# Patient Record
Sex: Male | Born: 1945
Health system: Southern US, Community
[De-identification: ages and names within clinical notes are randomized; demographics above are authoritative.]

## PROBLEM LIST (undated history)

## (undated) DIAGNOSIS — K5790 Diverticulosis of intestine, part unspecified, without perforation or abscess without bleeding: Secondary | ICD-10-CM

## (undated) DIAGNOSIS — M1990 Unspecified osteoarthritis, unspecified site: Secondary | ICD-10-CM

## (undated) DIAGNOSIS — C61 Malignant neoplasm of prostate: Secondary | ICD-10-CM

## (undated) DIAGNOSIS — M479 Spondylosis, unspecified: Secondary | ICD-10-CM

## (undated) DIAGNOSIS — T7840XA Allergy, unspecified, initial encounter: Secondary | ICD-10-CM

## (undated) DIAGNOSIS — I700 Atherosclerosis of aorta: Secondary | ICD-10-CM

## (undated) DIAGNOSIS — I10 Essential (primary) hypertension: Secondary | ICD-10-CM

## (undated) DIAGNOSIS — K219 Gastro-esophageal reflux disease without esophagitis: Secondary | ICD-10-CM

## (undated) DIAGNOSIS — H269 Unspecified cataract: Secondary | ICD-10-CM

## (undated) HISTORY — DX: Unspecified cataract: H26.9

## (undated) HISTORY — DX: Atherosclerosis of aorta: I70.0

## (undated) HISTORY — DX: Spondylosis, unspecified: M47.9

## (undated) HISTORY — PX: KNEE SURGERY: SHX244

## (undated) HISTORY — DX: Allergy, unspecified, initial encounter: T78.40XA

## (undated) HISTORY — PX: TONSILLECTOMY: SHX5217

## (undated) HISTORY — DX: Essential (primary) hypertension: I10

## (undated) HISTORY — DX: Gastro-esophageal reflux disease without esophagitis: K21.9

## (undated) HISTORY — DX: Unspecified osteoarthritis, unspecified site: M19.90

## (undated) HISTORY — DX: Diverticulosis of intestine, part unspecified, without perforation or abscess without bleeding: K57.90

---

## 2001-01-02 ENCOUNTER — Encounter: Payer: Self-pay | Admitting: Emergency Medicine

## 2001-01-02 ENCOUNTER — Emergency Department (HOSPITAL_COMMUNITY): Admission: EM | Admit: 2001-01-02 | Discharge: 2001-01-02 | Payer: Self-pay | Admitting: Emergency Medicine

## 2006-02-02 ENCOUNTER — Emergency Department (HOSPITAL_COMMUNITY): Admission: EM | Admit: 2006-02-02 | Discharge: 2006-02-02 | Payer: Self-pay | Admitting: Emergency Medicine

## 2011-11-15 ENCOUNTER — Ambulatory Visit: Payer: Medicare Other | Attending: Physical Medicine and Rehabilitation

## 2011-11-15 DIAGNOSIS — M25659 Stiffness of unspecified hip, not elsewhere classified: Secondary | ICD-10-CM | POA: Insufficient documentation

## 2011-11-15 DIAGNOSIS — M545 Low back pain, unspecified: Secondary | ICD-10-CM | POA: Insufficient documentation

## 2011-11-15 DIAGNOSIS — IMO0001 Reserved for inherently not codable concepts without codable children: Secondary | ICD-10-CM | POA: Insufficient documentation

## 2011-11-15 DIAGNOSIS — R5381 Other malaise: Secondary | ICD-10-CM | POA: Insufficient documentation

## 2011-11-18 ENCOUNTER — Ambulatory Visit: Payer: Medicare Other | Admitting: Physical Therapy

## 2011-11-21 ENCOUNTER — Ambulatory Visit: Payer: Medicare Other

## 2011-11-23 ENCOUNTER — Ambulatory Visit: Payer: Medicare Other | Admitting: Physical Therapy

## 2011-11-29 ENCOUNTER — Ambulatory Visit: Payer: Medicare Other

## 2011-12-01 ENCOUNTER — Ambulatory Visit: Payer: Medicare Other

## 2011-12-05 ENCOUNTER — Ambulatory Visit: Payer: Medicare Other

## 2011-12-06 ENCOUNTER — Encounter: Payer: Medicare Other | Admitting: Physical Therapy

## 2011-12-12 ENCOUNTER — Encounter: Payer: Medicare Other | Admitting: Physical Therapy

## 2011-12-14 ENCOUNTER — Ambulatory Visit: Payer: Medicare Other | Attending: Physical Medicine and Rehabilitation

## 2011-12-14 DIAGNOSIS — M25659 Stiffness of unspecified hip, not elsewhere classified: Secondary | ICD-10-CM | POA: Insufficient documentation

## 2011-12-14 DIAGNOSIS — IMO0001 Reserved for inherently not codable concepts without codable children: Secondary | ICD-10-CM | POA: Insufficient documentation

## 2011-12-14 DIAGNOSIS — M545 Low back pain, unspecified: Secondary | ICD-10-CM | POA: Insufficient documentation

## 2011-12-14 DIAGNOSIS — R5381 Other malaise: Secondary | ICD-10-CM | POA: Insufficient documentation

## 2012-01-13 ENCOUNTER — Other Ambulatory Visit (HOSPITAL_COMMUNITY): Payer: Self-pay | Admitting: Orthopedic Surgery

## 2012-01-13 ENCOUNTER — Ambulatory Visit (HOSPITAL_COMMUNITY)
Admission: RE | Admit: 2012-01-13 | Discharge: 2012-01-13 | Disposition: A | Discharge status: Home / Self Care | Payer: Medicare Other | Source: Ambulatory Visit | Attending: Orthopedic Surgery | Admitting: Orthopedic Surgery

## 2012-01-13 DIAGNOSIS — Z1389 Encounter for screening for other disorder: Secondary | ICD-10-CM | POA: Insufficient documentation

## 2012-01-13 DIAGNOSIS — M25569 Pain in unspecified knee: Secondary | ICD-10-CM | POA: Insufficient documentation

## 2012-01-13 DIAGNOSIS — M25561 Pain in right knee: Secondary | ICD-10-CM

## 2012-02-22 ENCOUNTER — Ambulatory Visit: Payer: Medicare Other | Attending: Orthopedic Surgery

## 2012-02-22 DIAGNOSIS — M25569 Pain in unspecified knee: Secondary | ICD-10-CM | POA: Insufficient documentation

## 2012-02-22 DIAGNOSIS — IMO0001 Reserved for inherently not codable concepts without codable children: Secondary | ICD-10-CM | POA: Insufficient documentation

## 2012-02-22 DIAGNOSIS — M6281 Muscle weakness (generalized): Secondary | ICD-10-CM | POA: Insufficient documentation

## 2012-02-22 DIAGNOSIS — R269 Unspecified abnormalities of gait and mobility: Secondary | ICD-10-CM | POA: Insufficient documentation

## 2012-02-28 ENCOUNTER — Ambulatory Visit: Payer: Medicare Other | Admitting: Physical Therapy

## 2012-03-01 ENCOUNTER — Ambulatory Visit: Payer: Medicare Other

## 2012-03-06 ENCOUNTER — Ambulatory Visit: Payer: Medicare Other | Admitting: Physical Therapy

## 2012-03-08 ENCOUNTER — Ambulatory Visit: Payer: Medicare Other | Admitting: Physical Therapy

## 2012-03-13 ENCOUNTER — Ambulatory Visit: Payer: Medicare Other | Attending: Family Medicine

## 2012-03-13 DIAGNOSIS — R269 Unspecified abnormalities of gait and mobility: Secondary | ICD-10-CM | POA: Insufficient documentation

## 2012-03-13 DIAGNOSIS — M25569 Pain in unspecified knee: Secondary | ICD-10-CM | POA: Insufficient documentation

## 2012-03-13 DIAGNOSIS — M6281 Muscle weakness (generalized): Secondary | ICD-10-CM | POA: Insufficient documentation

## 2012-03-13 DIAGNOSIS — IMO0001 Reserved for inherently not codable concepts without codable children: Secondary | ICD-10-CM | POA: Insufficient documentation

## 2012-03-15 ENCOUNTER — Ambulatory Visit: Payer: Medicare Other | Admitting: Physical Therapy

## 2012-03-20 ENCOUNTER — Ambulatory Visit: Payer: Medicare Other | Admitting: Physical Therapy

## 2012-03-23 ENCOUNTER — Ambulatory Visit: Payer: Medicare Other | Admitting: Physical Therapy

## 2012-03-27 ENCOUNTER — Ambulatory Visit: Payer: Medicare Other

## 2012-03-30 ENCOUNTER — Ambulatory Visit: Payer: Medicare Other

## 2012-04-03 ENCOUNTER — Encounter: Payer: Medicare Other | Admitting: Physical Therapy

## 2012-12-01 ENCOUNTER — Emergency Department (HOSPITAL_COMMUNITY): Payer: Medicare Other

## 2012-12-01 ENCOUNTER — Encounter (HOSPITAL_COMMUNITY): Payer: Self-pay | Admitting: Emergency Medicine

## 2012-12-01 ENCOUNTER — Emergency Department (HOSPITAL_COMMUNITY)
Admission: EM | Admit: 2012-12-01 | Discharge: 2012-12-02 | Disposition: A | Discharge status: Home / Self Care | Payer: Medicare Other | Attending: Emergency Medicine | Admitting: Emergency Medicine

## 2012-12-01 DIAGNOSIS — S8990XA Unspecified injury of unspecified lower leg, initial encounter: Secondary | ICD-10-CM | POA: Insufficient documentation

## 2012-12-01 DIAGNOSIS — Z79899 Other long term (current) drug therapy: Secondary | ICD-10-CM | POA: Insufficient documentation

## 2012-12-01 DIAGNOSIS — Y929 Unspecified place or not applicable: Secondary | ICD-10-CM | POA: Insufficient documentation

## 2012-12-01 DIAGNOSIS — X500XXA Overexertion from strenuous movement or load, initial encounter: Secondary | ICD-10-CM | POA: Insufficient documentation

## 2012-12-01 DIAGNOSIS — M25562 Pain in left knee: Secondary | ICD-10-CM

## 2012-12-01 DIAGNOSIS — Y939 Activity, unspecified: Secondary | ICD-10-CM | POA: Insufficient documentation

## 2012-12-01 HISTORY — DX: Malignant neoplasm of prostate: C61

## 2012-12-01 MED ORDER — OXYCODONE-ACETAMINOPHEN 5-325 MG PO TABS
2.0000 | ORAL_TABLET | Freq: Once | ORAL | Status: AC
  Administered 2012-12-01: 2 via ORAL
  Filled 2012-12-01: qty 2

## 2012-12-01 NOTE — ED Provider Notes (Signed)
CSN: [id number]     Arrival date & time 12/01/12  2233 History  This chart was scribed for non-physician practitioner Antony Madura, PA-C, working with Doug Sou, MD by Dorothey Baseman, ED Scribe. This patient was seen in room TR08C/TR08C and the patient's care was started at 11:34 PM.    Chief Complaint  Patient presents with  . Knee Pain   Patient is a 67 y.o. male presenting with knee pain. The history is provided by the patient. No language interpreter was used.  Knee Pain Location:  Knee Knee location:  L knee Pain details:    Radiates to:  Does not radiate   Severity:  Moderate   Onset quality:  Sudden   Timing:  Constant   Progression:  Worsening Relieved by:  Nothing Worsened by:  Bearing weight Ineffective treatments:  Muscle relaxant and ice Associated symptoms: swelling   Associated symptoms: no numbness and no tingling    HPI Comments: Marcus Gomez is a 67 y.o. male who presents to the Emergency Department complaining of a constant, sore pain to the left knee onset a few days ago. Patient reports that yesterday the knee "popped" loudly and the pain began progressively worsening. He reports that the pain is exacerbated with movement and walking. Patient reports some associated swelling to the area. He reports applying ice to the area and taking Robaxin at home without relief. He denies numbness and paresthesias. Patient reports a history of surgery to the right knee in February, 2014 (performed by Dr. Charlann Boxer).    Past Medical History  Diagnosis Date  . Prostate cancer     Remission   No past surgical history on file. Family History  Problem Relation Age of Onset  . Stroke Mother    History  Substance Use Topics  . Smoking status: Never Smoker   . Smokeless tobacco: Never Used  . Alcohol Use: No    Review of Systems  Musculoskeletal: Positive for arthralgias and joint swelling.  Neurological: Negative for numbness.    Allergies  Review of patient's  allergies indicates no known allergies.  Home Medications   Current Outpatient Rx  Name  Route  Sig  Dispense  Refill  . methocarbamol (ROBAXIN) 500 MG tablet   Oral   Take 500 mg by mouth 4 (four) times daily.         Marland Kitchen ibuprofen (ADVIL,MOTRIN) 600 MG tablet   Oral   Take 1 tablet (600 mg total) by mouth every 6 (six) hours as needed.   30 tablet   0   . oxyCODONE-acetaminophen (PERCOCET/ROXICET) 5-325 MG per tablet   Oral   Take 1-2 tablets by mouth every 6 (six) hours as needed for severe pain.   17 tablet   0    Triage Vitals: BP 152/101  Pulse 111  Temp(Src) 98.7 F (37.1 C) (Oral)  Resp 18  SpO2 97%  Physical Exam  Nursing note and vitals reviewed. Constitutional: He is oriented to person, place, and time. He appears well-developed and well-nourished. No distress.  HENT:  Head: Normocephalic and atraumatic.  Eyes: Conjunctivae and EOM are normal. No scleral icterus.  Neck: Normal range of motion.  Cardiovascular: Normal rate, regular rhythm and intact distal pulses.   Pulses:      Dorsalis pedis pulses are 2+ on the right side, and 2+ on the left side.       Posterior tibial pulses are 2+ on the right side, and 2+ on the left side.  Pulmonary/Chest:  Effort normal. No respiratory distress.  Musculoskeletal: Normal range of motion.  Tenderness to palpation to the medial and lateral joint lines of the left knee. Limitation of ROM with extension of the left knee secondary to pain. Pain with valgus and varous stressing of L knee. No laxity appreciated.  Neurological: He is alert and oriented to person, place, and time. He has normal reflexes. He displays normal reflexes. No sensory deficit.  Distal sensation intact. No gross sensory deficits in the lower extremities bilaterally. 5/5 strength with flexion and extension of the left knee.   Skin: Skin is warm and dry. No rash noted. He is not diaphoretic. No erythema. No pallor.  Psychiatric: He has a normal mood and  affect. His behavior is normal.    ED Course  Procedures (including critical care time)  DIAGNOSTIC STUDIES: Oxygen Saturation is 97% on room air, normal by my interpretation.    COORDINATION OF CARE: 11:39 PM- Ordered an x-ray of the left knee. Discussed treatment plan with patient at bedside and patient verbalized agreement.   Labs Review Labs Reviewed - No data to display  Imaging Review Dg Knee Complete 4 Views Left  12/01/2012   CLINICAL DATA:  Pain in the anterior and posterior knee.  EXAM: LEFT KNEE - COMPLETE 4+ VIEW  COMPARISON:  None.  FINDINGS: There is no evidence of fracture, dislocation. There is mild tibial spine osteophytosis. Minimal suprapatellar effusion is identified. There is mild narrowing of the medial femoral tibial space.  IMPRESSION: No acute fracture or dislocation. Mild degenerative joint changes of left knee.   Electronically Signed   By: Sherian Rein M.D.   On: 12/01/2012 23:34    EKG Interpretation   None       MDM   1. Left knee pain    Patient presents for left knee pain with onset 2 days ago after hearing a "pop". Patient neurovascularly intact with normal strength and sensation in his left lower extremity. Patient with difficulty bearing weight secondary to pain, though no laxity appreciated on physical exam. No evidence of septic joint such as significant swelling, erythema, or heat to touch. X-ray negative for acute fracture or dislocation of the left knee though mild degenerative changes are appreciated. Patient treated in ED with Percocet for pain control. He was given a knee sleeve for added stability and states he has a roller walker at home to refrain from weight bearing on his left leg. Have recommended the patient followup with his orthopedist, Dr. Charlann Boxer, regarding his symptoms. Patient prescribed ibuprofen for pain control and Percocet for breakthrough pain. Return precautions discussed with the patient who verbalizes comfort and  understanding with this discharge plan. Patient hemodynamically stable and appropriate for discharge.  I personally performed the services described in this documentation, which was scribed in my presence. The recorded information has been reviewed and is accurate.   Filed Vitals:   12/01/12 2257 12/02/12 0036  BP: 152/101 152/70  Pulse: 111 99  Temp: 98.7 F (37.1 C) 98 F (36.7 C)  TempSrc: Oral Oral  Resp: 18 18  SpO2: 97% 97%     Antony Madura, PA-C 12/02/12 [phone]

## 2012-12-01 NOTE — ED Notes (Signed)
Pt c/o left sided knee pain x 2 days. Yesterday the knee popped and pain became to severe.

## 2012-12-01 NOTE — ED Notes (Addendum)
Pt stated that yesterday his knee popped while walking, and he has been having severe pain since. He stated that he has pain all over the knee and up and down his leg. Pt has a hard time walking.  Pain is in left knee. Left knee tender to touch. Slight swelling.  No cardiac or respiratory distress. Will continue to monitor.

## 2012-12-02 MED ORDER — IBUPROFEN 600 MG PO TABS: 600.0000 mg | ORAL_TABLET | Freq: Four times a day (QID) | ORAL | Status: DC | PRN

## 2012-12-02 MED ORDER — OXYCODONE-ACETAMINOPHEN 5-325 MG PO TABS: 1.0000 | ORAL_TABLET | Freq: Four times a day (QID) | ORAL | Status: DC | PRN

## 2012-12-02 NOTE — ED Notes (Signed)
Pt made aware that he cannot drive and operate on heavy machinery while taking pain medications. Pt notified not to take additional Tylenol.

## 2012-12-02 NOTE — ED Provider Notes (Signed)
Medical screening examination/treatment/procedure(s) were performed by non-physician practitioner and as supervising physician I was immediately available for consultation/collaboration.  EKG Interpretation   None        Doug Sou, MD 12/02/12 [phone]

## 2012-12-02 NOTE — ED Notes (Signed)
Pt stated that chair was uncomfortable Moved pt into a room with a bed. Pt stated bed is more comfortable.

## 2012-12-09 ENCOUNTER — Emergency Department (HOSPITAL_COMMUNITY): Payer: Medicare Other

## 2012-12-09 ENCOUNTER — Encounter (HOSPITAL_COMMUNITY): Payer: Self-pay | Admitting: Emergency Medicine

## 2012-12-09 ENCOUNTER — Emergency Department (HOSPITAL_COMMUNITY)
Admission: EM | Admit: 2012-12-09 | Discharge: 2012-12-09 | Disposition: A | Discharge status: Home / Self Care | Payer: Medicare Other | Attending: Emergency Medicine | Admitting: Emergency Medicine

## 2012-12-09 DIAGNOSIS — Z8546 Personal history of malignant neoplasm of prostate: Secondary | ICD-10-CM | POA: Insufficient documentation

## 2012-12-09 DIAGNOSIS — R109 Unspecified abdominal pain: Secondary | ICD-10-CM | POA: Insufficient documentation

## 2012-12-09 DIAGNOSIS — N509 Disorder of male genital organs, unspecified: Secondary | ICD-10-CM | POA: Insufficient documentation

## 2012-12-09 DIAGNOSIS — R197 Diarrhea, unspecified: Secondary | ICD-10-CM | POA: Insufficient documentation

## 2012-12-09 LAB — CBC WITH DIFFERENTIAL/PLATELET
HCT: 44.4 % (ref 39.0–52.0)
Hemoglobin: 15.5 g/dL (ref 13.0–17.0)
Lymphocytes Relative: 19 % (ref 12–46)
Lymphs Abs: 1.3 10*3/uL (ref 0.7–4.0)
MCHC: 34.9 g/dL (ref 30.0–36.0)
Monocytes Absolute: 0.5 10*3/uL (ref 0.1–1.0)
Monocytes Relative: 7 % (ref 3–12)
Neutro Abs: 5 10*3/uL (ref 1.7–7.7)
Neutrophils Relative %: 74 % (ref 43–77)

## 2012-12-09 LAB — URINALYSIS, ROUTINE W REFLEX MICROSCOPIC
Leukocytes, UA: NEGATIVE
Protein, ur: NEGATIVE mg/dL
Urobilinogen, UA: 0.2 mg/dL (ref 0.0–1.0)
pH: 5.5 (ref 5.0–8.0)

## 2012-12-09 LAB — COMPREHENSIVE METABOLIC PANEL
ALT: 21 U/L (ref 0–53)
BUN: 18 mg/dL (ref 6–23)
CO2: 22 mEq/L (ref 19–32)
Chloride: 97 mEq/L (ref 96–112)
Creatinine, Ser: 1.03 mg/dL (ref 0.50–1.35)
GFR calc non Af Amer: 73 mL/min — ABNORMAL LOW (ref >90)
Total Bilirubin: 1.3 mg/dL — ABNORMAL HIGH (ref 0.3–1.2)

## 2012-12-09 MED ORDER — OXYCODONE-ACETAMINOPHEN 5-325 MG PO TABS: 1.0000 | ORAL_TABLET | Freq: Four times a day (QID) | ORAL | Status: DC | PRN

## 2012-12-09 MED ORDER — MORPHINE SULFATE 4 MG/ML IJ SOLN
4.0000 mg | Freq: Once | INTRAMUSCULAR | Status: AC
  Administered 2012-12-09: 4 mg via INTRAVENOUS
  Filled 2012-12-09: qty 1

## 2012-12-09 MED ORDER — SODIUM CHLORIDE 0.9 % IV BOLUS (SEPSIS)
500.0000 mL | Freq: Once | INTRAVENOUS | Status: AC
  Administered 2012-12-09: 500 mL via INTRAVENOUS

## 2012-12-09 MED ORDER — ONDANSETRON HCL 4 MG/2ML IJ SOLN
4.0000 mg | Freq: Once | INTRAMUSCULAR | Status: AC
  Administered 2012-12-09: 4 mg via INTRAVENOUS
  Filled 2012-12-09: qty 2

## 2012-12-09 NOTE — ED Notes (Signed)
Pt reports R flank/back pain radiating into R testicle increasingly worse over past 3 days. Reports last night he had diarrhea also

## 2012-12-09 NOTE — ED Notes (Signed)
Patient transported to CT 

## 2012-12-09 NOTE — ED Provider Notes (Signed)
CSN: [id number]     Arrival date & time 12/09/12  1226 History   First MD Initiated Contact with Patient 12/09/12 1253     Chief Complaint  Patient presents with  . Flank Pain  . Groin Pain   Patient is a 67 y.o. male presenting with flank pain and groin pain. The history is provided by the patient.  Flank Pain This is a new problem. The current episode started 2 days ago. The problem occurs constantly. The problem has been gradually worsening. Associated symptoms include abdominal pain. Pertinent negatives include no chest pain, no headaches and no shortness of breath. Nothing aggravates the symptoms. Nothing relieves the symptoms. He has tried rest for the symptoms. The treatment provided no relief.  Groin Pain Associated symptoms include abdominal pain. Pertinent negatives include no chest pain, no headaches and no shortness of breath.  pt reports similar pain "over 10 yrs ago" and it was thought to be kidney stone but none was ever found He reports pain starts in his right flank then radiates into right testicle No trauma reported   Past Medical History  Diagnosis Date  . Prostate cancer     Remission   History reviewed. No pertinent past surgical history. Family History  Problem Relation Age of Onset  . Stroke Mother    History  Substance Use Topics  . Smoking status: Never Smoker   . Smokeless tobacco: Never Used  . Alcohol Use: No    Review of Systems  Constitutional: Negative for fever.  Respiratory: Negative for shortness of breath.   Cardiovascular: Negative for chest pain.  Gastrointestinal: Positive for abdominal pain and diarrhea. Negative for vomiting.  Genitourinary: Positive for flank pain and testicular pain. Negative for dysuria.  Neurological: Negative for weakness and headaches.  All other systems reviewed and are negative.    Allergies  Review of patient's allergies indicates no known allergies.  Home Medications   Current Outpatient Rx  Name   Route  Sig  Dispense  Refill  . ibuprofen (ADVIL,MOTRIN) 600 MG tablet   Oral   Take 1 tablet (600 mg total) by mouth every 6 (six) hours as needed.   30 tablet   0   . methocarbamol (ROBAXIN) 500 MG tablet   Oral   Take 500 mg by mouth 4 (four) times daily.         Marland Kitchen oxyCODONE-acetaminophen (PERCOCET/ROXICET) 5-325 MG per tablet   Oral   Take 1-2 tablets by mouth every 6 (six) hours as needed for severe pain.   17 tablet   0    BP 119/58  Pulse 82  Temp(Src) 98 F (36.7 C) (Oral)  Resp 16  Wt 225 lb 14.4 oz (102.468 kg)  SpO2 98% Physical Exam CONSTITUTIONAL: Well developed/well nourished HEAD: Normocephalic/atraumatic EYES: EOMI/PERRL ENMT: Mucous membranes moist NECK: supple no meningeal signs SPINE:entire spine nontender CV: S1/S2 noted, no murmurs/rubs/gallops noted LUNGS: Lungs are clear to auscultation bilaterally, no apparent distress ABDOMEN: soft, nontender, no rebound or guarding WU:JWJXB cva tenderness. No inguinal hernia, no scrotal edema/erythema.  No penile lesions.  Mild tenderness to right testicle NEURO: Pt is awake/alert, moves all extremitiesx4 EXTREMITIES: pulses normal, full ROM SKIN: warm, color normal PSYCH: no abnormalities of mood noted  ED Course  Procedures  Labs Review Labs Reviewed  CBC WITH DIFFERENTIAL  COMPREHENSIVE METABOLIC PANEL  URINALYSIS, ROUTINE W REFLEX MICROSCOPIC   Imaging Review No results found.  EKG Interpretation   None     1:41 PM Labs  pending No signs of testicular torsion at this time 3:37 PM Pain continues Will proceed with CT imaging (ureterolithiasis is still possible despite negative u/a) D/w dr Wilkie Aye to f/u on CT imaging and dispo accordingly   MDM  No diagnosis found. Nursing notes including past medical history and social history reviewed and considered in documentation Labs/vital reviewed and considered     Joya Gaskins, MD 12/09/12 1538

## 2013-02-18 ENCOUNTER — Emergency Department (HOSPITAL_COMMUNITY)
Admission: EM | Admit: 2013-02-18 | Discharge: 2013-02-18 | Disposition: A | Discharge status: Home / Self Care | Payer: Medicare Other | Source: Home / Self Care | Attending: Family Medicine | Admitting: Family Medicine

## 2013-02-18 ENCOUNTER — Encounter (HOSPITAL_COMMUNITY): Payer: Self-pay | Admitting: Emergency Medicine

## 2013-02-18 DIAGNOSIS — J329 Chronic sinusitis, unspecified: Secondary | ICD-10-CM

## 2013-02-18 MED ORDER — AMOXICILLIN-POT CLAVULANATE 875-125 MG PO TABS: 1.0000 | ORAL_TABLET | Freq: Two times a day (BID) | ORAL | Status: DC

## 2013-02-18 NOTE — ED Notes (Signed)
Pt  Reports  Symptoms  Of    Sinus  Drainage        And  Congestion   And  Cough  For     sev  Days  With the  Symptoms  Nor  releived  By otc  meds           The  Pt  denys  Any  Nausea  /  Vomiting  Or  Any  Diarrhea    He  Is  Sitting  Upright on exam table  Speaking in  Complete  sentances

## 2013-02-18 NOTE — Discharge Instructions (Signed)

## 2013-02-18 NOTE — ED Provider Notes (Signed)
CSN: [id number]     Arrival date & time 02/18/13  0920 History   First MD Initiated Contact with Patient 02/18/13 0930     No chief complaint on file.  (Consider location/radiation/quality/duration/timing/severity/associated sxs/prior Treatment) Patient is a 68 y.o. male presenting with cough. The history is provided by the patient. No language interpreter was used.  Cough Cough characteristics:  Productive Sputum characteristics:  Green Severity:  Moderate Onset quality:  Gradual Duration:  1 week Timing:  Constant Progression:  Worsening Chronicity:  New Smoker: yes   Relieved by:  Nothing Worsened by:  Nothing tried Ineffective treatments:  None tried Associated symptoms: fever   Risk factors: recent infection     Past Medical History  Diagnosis Date  . Prostate cancer     Remission   No past surgical history on file. Family History  Problem Relation Age of Onset  . Stroke Mother    History  Substance Use Topics  . Smoking status: Never Smoker   . Smokeless tobacco: Never Used  . Alcohol Use: No    Review of Systems  Constitutional: Positive for fever.  Respiratory: Positive for cough.   All other systems reviewed and are negative.    Allergies  Review of patient's allergies indicates no known allergies.  Home Medications   Current Outpatient Rx  Name  Route  Sig  Dispense  Refill  . ibuprofen (ADVIL,MOTRIN) 600 MG tablet   Oral   Take 1 tablet (600 mg total) by mouth every 6 (six) hours as needed.   30 tablet   0   . methocarbamol (ROBAXIN) 500 MG tablet   Oral   Take 500 mg by mouth 4 (four) times daily.         Marland Kitchen oxyCODONE-acetaminophen (PERCOCET/ROXICET) 5-325 MG per tablet   Oral   Take 1-2 tablets by mouth every 6 (six) hours as needed for severe pain.   17 tablet   0   . oxyCODONE-acetaminophen (PERCOCET/ROXICET) 5-325 MG per tablet   Oral   Take 1 tablet by mouth every 6 (six) hours as needed for severe pain.   10 tablet   0     BP 138/81  Pulse 96  Temp(Src) 99.4 F (37.4 C) (Oral)  Resp 18  SpO2 96% Physical Exam  Nursing note and vitals reviewed. Constitutional: He is oriented to person, place, and time. He appears well-developed and well-nourished.  HENT:  Head: Normocephalic and atraumatic.  Right Ear: External ear normal.  Left Ear: External ear normal.  Eyes: EOM are normal. Pupils are equal, round, and reactive to light.  Neck: Normal range of motion.  Cardiovascular: Normal rate and regular rhythm.   Pulmonary/Chest: Effort normal and breath sounds normal.  Abdominal: Soft. He exhibits no distension.  Musculoskeletal: Normal range of motion.  Neurological: He is alert and oriented to person, place, and time.  Skin: Skin is warm.  Psychiatric: He has a normal mood and affect.    ED Course  Procedures (including critical care time) Labs Review Labs Reviewed - No data to display Imaging Review No results found.    MDM   1. Sinus infection    augmentin    [place], [place] 02/18/13 [phone]

## 2013-02-20 NOTE — ED Provider Notes (Signed)
Medical screening examination/treatment/procedure(s) were performed by a resident physician or non-physician practitioner and as the supervising physician I was immediately available for consultation/collaboration.  Lynne Leader, MD    Gregor Hams, MD 02/20/13 [phone]

## 2013-03-19 ENCOUNTER — Encounter (HOSPITAL_COMMUNITY): Payer: Self-pay | Admitting: Emergency Medicine

## 2013-03-19 ENCOUNTER — Emergency Department (HOSPITAL_COMMUNITY)
Admission: EM | Admit: 2013-03-19 | Discharge: 2013-03-19 | Disposition: A | Discharge status: Home / Self Care | Payer: Medicare Other | Source: Home / Self Care | Attending: Emergency Medicine | Admitting: Emergency Medicine

## 2013-03-19 DIAGNOSIS — J309 Allergic rhinitis, unspecified: Secondary | ICD-10-CM

## 2013-03-19 MED ORDER — FLUTICASONE PROPIONATE 50 MCG/ACT NA SUSP: 2.0000 | Freq: Every day | NASAL | Status: DC

## 2013-03-19 MED ORDER — CETIRIZINE HCL 10 MG PO TABS: 10.0000 mg | ORAL_TABLET | Freq: Every day | ORAL | Status: DC

## 2013-03-19 MED ORDER — BENZONATATE 100 MG PO CAPS: 100.0000 mg | ORAL_CAPSULE | Freq: Three times a day (TID) | ORAL | Status: DC | PRN

## 2013-03-19 NOTE — ED Notes (Signed)
C/o cold sx States he was here on 02/18/13 feeling the same way States cough is worst Antibiotic was given but did not full help States he does have some sneezing and body ache  Delsym was used for the cough

## 2013-03-19 NOTE — Discharge Instructions (Signed)
Allergic Rhinitis Allergic rhinitis is when the mucous membranes in the nose respond to allergens. Allergens are particles in the air that cause your body to have an allergic reaction. This causes you to release allergic antibodies. Through a chain of events, these eventually cause you to release histamine into the blood stream. Although meant to protect the body, it is this release of histamine that causes your discomfort, such as frequent sneezing, congestion, and an itchy, runny nose.  CAUSES  Seasonal allergic rhinitis (hay fever) is caused by pollen allergens that may come from grasses, trees, and weeds. Year-round allergic rhinitis (perennial allergic rhinitis) is caused by allergens such as house dust mites, pet dander, and mold spores.  SYMPTOMS   Nasal stuffiness (congestion).  Itchy, runny nose with sneezing and tearing of the eyes. DIAGNOSIS  Your health care provider can help you determine the allergen or allergens that trigger your symptoms. If you and your health care provider are unable to determine the allergen, skin or blood testing may be used. TREATMENT  Allergic Rhinitis does not have a cure, but it can be controlled by:  Medicines and allergy shots (immunotherapy).  Avoiding the allergen. Hay fever may often be treated with antihistamines in pill or nasal spray forms. Antihistamines block the effects of histamine. There are over-the-counter medicines that may help with nasal congestion and swelling around the eyes. Check with your health care provider before taking or giving this medicine.  If avoiding the allergen or the medicine prescribed do not work, there are many new medicines your health care provider can prescribe. Stronger medicine may be used if initial measures are ineffective. Desensitizing injections can be used if medicine and avoidance does not work. Desensitization is when a patient is given ongoing shots until the body becomes less sensitive to the allergen.  Make sure you follow up with your health care provider if problems continue. HOME CARE INSTRUCTIONS It is not possible to completely avoid allergens, but you can reduce your symptoms by taking steps to limit your exposure to them. It helps to know exactly what you are allergic to so that you can avoid your specific triggers. SEEK MEDICAL CARE IF:   You have a fever.  You develop a cough that does not stop easily (persistent).  You have shortness of breath.  You start wheezing.  Symptoms interfere with normal daily activities. Document Released: 09/21/2000 Document Revised: 10/17/2012 Document Reviewed: 09/03/2012 [employer] Patient Information 2014 [employer].  

## 2013-03-19 NOTE — ED Provider Notes (Signed)
CSN: [id number]     Arrival date & time 03/19/13  1058 History   First MD Initiated Contact with Patient 03/19/13 1222     Chief Complaint  Patient presents with  . URI   (Consider location/radiation/quality/duration/timing/severity/associated sxs/prior Treatment) HPI Comments: Patient work in [employer] school [employer] and reports a 3 day history of rhinorrhea, sneezing, throat irritation, post nasal drainage, cough and generalized malaise without reports of fever or GI issues. Denies chest pain or dyspnea. States this a recurrent issue. Used to see [facility], but no longer under their care and does not take medication for environmental allergy any longer. Patient is a non-smoker. PCP: Dr. Rex Kras   Patient is a 68 y.o. male presenting with URI. The history is provided by the patient.  URI   Past Medical History  Diagnosis Date  . Prostate cancer     Remission   History reviewed. No pertinent past surgical history. Family History  Problem Relation Age of Onset  . Stroke Mother    History  Substance Use Topics  . Smoking status: Never Smoker   . Smokeless tobacco: Never Used  . Alcohol Use: No    Review of Systems  All other systems reviewed and are negative.    Allergies  Review of patient's allergies indicates no known allergies.  Home Medications   Current Outpatient Rx  Name  Route  Sig  Dispense  Refill  . amoxicillin-clavulanate (AUGMENTIN) 875-125 MG per tablet   Oral   Take 1 tablet by mouth every 12 (twelve) hours.   21 tablet   0   . benzonatate (TESSALON) 100 MG capsule   Oral   Take 1 capsule (100 mg total) by mouth 3 (three) times daily as needed for cough.   21 capsule   0   . cetirizine (ZYRTEC) 10 MG tablet   Oral   Take 1 tablet (10 mg total) by mouth daily.   30 tablet   0   . fluticasone (FLONASE) 50 MCG/ACT nasal spray   Each Nare   Place 2 sprays into both nostrils daily.   16 g   1   . ibuprofen (ADVIL,MOTRIN) 600 MG tablet    Oral   Take 1 tablet (600 mg total) by mouth every 6 (six) hours as needed.   30 tablet   0   . methocarbamol (ROBAXIN) 500 MG tablet   Oral   Take 500 mg by mouth 4 (four) times daily.         Marland Kitchen oxyCODONE-acetaminophen (PERCOCET/ROXICET) 5-325 MG per tablet   Oral   Take 1-2 tablets by mouth every 6 (six) hours as needed for severe pain.   17 tablet   0   . oxyCODONE-acetaminophen (PERCOCET/ROXICET) 5-325 MG per tablet   Oral   Take 1 tablet by mouth every 6 (six) hours as needed for severe pain.   10 tablet   0    BP 125/71  Pulse 79  Temp(Src) 99.9 F (37.7 C) (Oral)  Resp 18  SpO2 95% Physical Exam  Nursing note and vitals reviewed. Constitutional: He is oriented to person, place, and time. He appears well-developed and well-nourished. No distress.  HENT:  Head: Normocephalic and atraumatic.  Right Ear: Hearing, tympanic membrane, external ear and ear canal normal.  Left Ear: Hearing, tympanic membrane, external ear and ear canal normal.  Nose: Nose normal.  Mouth/Throat: Uvula is midline and mucous membranes are normal. No oropharyngeal exudate, posterior oropharyngeal edema or tonsillar abscesses.  Mild posterior  erythema with post nasal drainage.   Eyes: Conjunctivae are normal. Right eye exhibits no discharge. Left eye exhibits no discharge. No scleral icterus.  Neck: Normal range of motion. Neck supple.  Cardiovascular: Normal rate, regular rhythm and normal heart sounds.   Pulmonary/Chest: Effort normal and breath sounds normal. No respiratory distress. He has no wheezes.  Abdominal: Soft. Bowel sounds are normal.  Musculoskeletal: Normal range of motion.  Lymphadenopathy:    He has no cervical adenopathy.  Neurological: He is alert and oriented to person, place, and time.  Skin: Skin is warm and dry. No rash noted.  Psychiatric: He has a normal mood and affect. His behavior is normal.    ED Course  Procedures (including critical care time) Labs  Review Labs Reviewed - No data to display Imaging Review No results found.   MDM   1. Allergic rhinitis    Allergic rhinitis: Will treat with Zyrtec, Flonase and tessalon for cough and advise PCP follow up if no improvement.     [place], [place] 03/19/13 [phone]

## 2013-03-19 NOTE — ED Provider Notes (Signed)
Medical screening examination/treatment/procedure(s) were performed by non-physician practitioner and as supervising physician I was immediately available for consultation/collaboration.  Philipp Deputy, M.D.  Harden Mo, MD 03/19/13 [phone]

## 2013-07-23 ENCOUNTER — Other Ambulatory Visit (HOSPITAL_COMMUNITY): Payer: Self-pay | Admitting: *Deleted

## 2013-07-23 DIAGNOSIS — I714 Abdominal aortic aneurysm, without rupture, unspecified: Secondary | ICD-10-CM

## 2013-07-24 ENCOUNTER — Ambulatory Visit (HOSPITAL_COMMUNITY): Payer: Medicare Other | Attending: Family Medicine | Admitting: *Deleted

## 2013-07-24 DIAGNOSIS — I714 Abdominal aortic aneurysm, without rupture, unspecified: Secondary | ICD-10-CM | POA: Insufficient documentation

## 2013-07-24 DIAGNOSIS — I700 Atherosclerosis of aorta: Secondary | ICD-10-CM

## 2013-07-24 DIAGNOSIS — I739 Peripheral vascular disease, unspecified: Secondary | ICD-10-CM | POA: Insufficient documentation

## 2013-07-24 DIAGNOSIS — L98499 Non-pressure chronic ulcer of skin of other sites with unspecified severity: Secondary | ICD-10-CM | POA: Insufficient documentation

## 2013-07-24 NOTE — Progress Notes (Signed)
Aorta Duplex Complete  

## 2013-07-29 ENCOUNTER — Telehealth: Payer: Self-pay | Admitting: *Deleted

## 2013-07-29 NOTE — Telephone Encounter (Signed)
Pt notified of results

## 2013-07-29 NOTE — Telephone Encounter (Signed)
Message copied by Desma Mcgregor on Mon Jul 29, 2013 11:39 AM ------      Message from: Marathon F      Created: Mon Jul 29, 2013 11:22 AM       Normal abdominal US, no evidence of abdominal aortic aneurysm ------

## 2014-01-10 HISTORY — PX: SHOULDER SURGERY: SHX246

## 2014-09-28 ENCOUNTER — Emergency Department (HOSPITAL_COMMUNITY)
Admission: EM | Admit: 2014-09-28 | Discharge: 2014-09-28 | Disposition: A | Discharge status: Home / Self Care | Payer: Medicare Other | Attending: Emergency Medicine | Admitting: Emergency Medicine

## 2014-09-28 ENCOUNTER — Emergency Department (HOSPITAL_COMMUNITY): Payer: Medicare Other

## 2014-09-28 ENCOUNTER — Encounter (HOSPITAL_COMMUNITY): Payer: Self-pay | Admitting: Emergency Medicine

## 2014-09-28 DIAGNOSIS — Z8546 Personal history of malignant neoplasm of prostate: Secondary | ICD-10-CM | POA: Diagnosis not present

## 2014-09-28 DIAGNOSIS — Z79899 Other long term (current) drug therapy: Secondary | ICD-10-CM | POA: Diagnosis not present

## 2014-09-28 DIAGNOSIS — M25511 Pain in right shoulder: Secondary | ICD-10-CM | POA: Diagnosis present

## 2014-09-28 DIAGNOSIS — M7501 Adhesive capsulitis of right shoulder: Secondary | ICD-10-CM

## 2014-09-28 DIAGNOSIS — Z7951 Long term (current) use of inhaled steroids: Secondary | ICD-10-CM | POA: Insufficient documentation

## 2014-09-28 MED ORDER — HYDROCODONE-ACETAMINOPHEN 5-325 MG PO TABS
1.0000 | ORAL_TABLET | Freq: Once | ORAL | Status: AC
  Administered 2014-09-28: 1 via ORAL
  Filled 2014-09-28: qty 1

## 2014-09-28 MED ORDER — ACETAMINOPHEN-CODEINE #3 300-30 MG PO TABS: 1.0000 | ORAL_TABLET | Freq: Four times a day (QID) | ORAL | Status: DC | PRN

## 2014-09-28 MED ORDER — METHOCARBAMOL 500 MG PO TABS: 500.0000 mg | ORAL_TABLET | Freq: Three times a day (TID) | ORAL | Status: DC | PRN

## 2014-09-28 NOTE — ED Notes (Signed)
Patient left at this time with all belongings. 

## 2014-09-28 NOTE — Discharge Instructions (Signed)
Arthralgia °Your caregiver has diagnosed you as suffering from an arthralgia. Arthralgia means there is pain in a joint. This can come from many reasons including: °· Bruising the joint which causes soreness (inflammation) in the joint. °· Wear and tear on the joints which occur as we grow older (osteoarthritis). °· Overusing the joint. °· Various forms of arthritis. °· Infections of the joint. °Regardless of the cause of pain in your joint, most of these different pains respond to anti-inflammatory drugs and rest. The exception to this is when a joint is infected, and these cases are treated with antibiotics, if it is a bacterial infection. °HOME CARE INSTRUCTIONS  °· Rest the injured area for as long as directed by your caregiver. Then slowly start using the joint as directed by your caregiver and as the pain allows. Crutches as directed may be useful if the ankles, knees or hips are involved. If the knee was splinted or casted, continue use and care as directed. If an stretchy or elastic wrapping bandage has been applied today, it should be removed and re-applied every 3 to 4 hours. It should not be applied tightly, but firmly enough to keep swelling down. Watch toes and feet for swelling, bluish discoloration, coldness, numbness or excessive pain. If any of these problems (symptoms) occur, remove the ace bandage and re-apply more loosely. If these symptoms persist, contact your caregiver or return to this location. °· For the first 24 hours, keep the injured extremity elevated on pillows while lying down. °· Apply ice for 15-20 minutes to the sore joint every couple hours while awake for the first half day. Then 03-04 times per day for the first 48 hours. Put the ice in a plastic bag and place a towel between the bag of ice and your skin. °· Wear any splinting, casting, elastic bandage applications, or slings as instructed. °· Only take over-the-counter or prescription medicines for pain, discomfort, or fever as  directed by your caregiver. Do not use aspirin immediately after the injury unless instructed by your physician. Aspirin can cause increased bleeding and bruising of the tissues. °· If you were given crutches, continue to use them as instructed and do not resume weight bearing on the sore joint until instructed. °Persistent pain and inability to use the sore joint as directed for more than 2 to 3 days are warning signs indicating that you should see a caregiver for a follow-up visit as soon as possible. Initially, a hairline fracture (break in bone) may not be evident on X-rays. Persistent pain and swelling indicate that further evaluation, non-weight bearing or use of the joint (use of crutches or slings as instructed), or further X-rays are indicated. X-rays may sometimes not show a small fracture until a week or 10 days later. Make a follow-up appointment with your own caregiver or one to whom we have referred you. A radiologist (specialist in reading X-rays) may read your X-rays. Make sure you know how you are to obtain your X-ray results. Do not assume everything is normal if you do not hear from us. °SEEK MEDICAL CARE IF: °Bruising, swelling, or pain increases. °SEEK IMMEDIATE MEDICAL CARE IF:  °· Your fingers or toes are numb or blue. °· The pain is not responding to medications and continues to stay the same or get worse. °· The pain in your joint becomes severe. °· You develop a fever over 102° F (38.9° C). °· It becomes impossible to move or use the joint. °MAKE SURE YOU:  °·   Understand these instructions.  Will watch your condition.  Will get help right away if you are not doing well or get worse. Document Released: 12/27/2004 Document Revised: 03/21/2011 Document Reviewed: 08/15/2007 [employer] Patient Information 2015 [place], [place]. This information is not intended to replace advice given to you by your health care provider. Make sure you discuss any questions you have with your health care  provider.  Adhesive Capsulitis Sometimes the shoulder becomes stiff and is painful to move. Some people say it feels as if the shoulder is frozen in place. Because of this, the condition is called "frozen shoulder." Its medical name is adhesive capsulitis.  The shoulder joint is made up of strong connective tissue that attaches the ball of the humerus to the shallow shoulder socket. This strong connective tissue is called the joint capsule. This tissue can become stiff and swollen. That is when adhesive capsulitis sets in. CAUSES  It is not always clear just what the cause adhesive capsulitis. Possibilities include:  Injury to the shoulder joint.  Strain. This is a repetitive injury brought about by overuse.  Lack of use. Perhaps your arm or hand was otherwise injured. It might have been in a sling for awhile. Or perhaps you were not using it to avoid pain.  Referred pain. This is a sort of trick the body plays. You feel pain in the shoulder. But, the pain actually comes from an injury somewhere else in the body.  Long-standing health problems. Several diseases can cause adhesive capsulitis. They include diabetes, heart disease, stroke, thyroid problems, rheumatoid arthritis and lung disease.  Being a women older than 35. Anyone can develop adhesive capsulitis but it is most common in women in this age group. SYMPTOMS   Pain.  It occurs when the arm is moved.  Parts of the shoulder might hurt if they are touched.  Pain is worse at night or when resting.  Soreness. It might not be strong enough to be called pain. But, the shoulder aches.  The shoulder does not move freely.  Muscle spasms.  Trouble sleeping because of shoulder ache or pain. DIAGNOSIS  To decide if you have adhesive capsulitis, your healthcare provider will probably:  Ask about symptoms you have noticed.  Ask about your history of joint pain and anything that might have caused the pain.  Ask about your overall  health.  Use hands to feel your shoulder and neck.  Ask you to move your shoulder in specific directions. This may indicate the origin of the pain.  Order imaging tests; pictures of the shoulder. They help pinpoint the source of the problem. An X-ray might be used. For more detail, an MRI is often used. An MRI details the tendons, muscles and ligaments as well as the joint. TREATMENT  Adhesive capsulitis can be treated several ways. Most treatments can be done in a clinic or in your healthcare provider's office. Be sure to discuss the different options with your caregiver. They include:  Physical therapy. You will work on specific exercises to get your shoulder moving again. The exercises usually involve stretching. A physical therapist (a caregiver with special training) can show you what to do and what not to do. The exercises will need to be done daily.  Medication.  Over-the-counter medicines may relieve pain and inflammation (the body's way of reacting to injury or infection).  Corticosteroids. These are stronger drugs to reduce pain and inflammation. They are given by injection (shots) into the shoulder joint. Frequent treatment is  not recommended.  Muscle relaxants. Medication may be prescribed to ease muscle spasms.  Treatment of underlying conditions. This means treating another condition that is causing your shoulder problem. This might be a rotator cuff (tendon) problem  Shoulder manipulation. The shoulder will be moved by your healthcare provider. You would be under general anesthesia (given a drug that puts you to sleep). You would not feel anything. Sometimes the joint will be injected with salt water (saline) at high pressure to break down internal scarring in the joint capsule.  Surgery. This is rarely needed. It may be suggested in advanced cases after all other treatment has failed. PROGNOSIS  In time, most people recover from adhesive capsulitis. Sometimes, however, the  pain goes away but full movement of the shoulder does not return.  HOME CARE INSTRUCTIONS   Take any pain medications recommended by your healthcare provider. Follow the directions carefully.  If you have physical therapy, follow through with the therapist's suggestions. Be sure you understand the exercises you will be doing. You should understand:  How often the exercises should be done.  How many times each exercise should be repeated.  How long they should be done.  What other activities you should do, or not do.  That you should warm up before doing any exercise. Just 5 to 10 minutes will help. Small, gentle movements should get your shoulder ready for more.  Avoid high-demand exercise that involves your shoulder such as throwing. This type of exercise can make pain worse.  Consider using cold packs. Cold may ease swelling and pain. Ask your healthcare provider if a cold pack might help you. If so, get directions on how and when to use them. SEEK MEDICAL CARE IF:   You have any questions about your medications.  Your pain continues to increase. Document Released: 10/24/2008 Document Revised: 03/21/2011 Document Reviewed: 10/24/2008 [facility] Patient Information 2015 [place], [place]. This information is not intended to replace advice given to you by your health care provider. Make sure you discuss any questions you have with your health care provider.

## 2014-09-28 NOTE — ED Notes (Addendum)
Pt reports R shoulder pain that has limited his movement of extremity. Pt has seen orthopedic MD before for problems with this shoulder. Pt sts he woke up early this am with it aching. Denies recent injury.

## 2014-09-28 NOTE — ED Provider Notes (Signed)
CSN: [id number]     Arrival date & time 09/28/14  2015 History   First MD Initiated Contact with Patient 09/28/14 2023     Chief Complaint  Patient presents with  . Shoulder Pain     (Consider location/radiation/quality/duration/timing/severity/associated sxs/prior Treatment) HPI   69 year old male who presents for evaluation of right shoulder pain. Patient reports since this morning he has had persistent 8 out of 10 sharp throbbing pain to his right shoulder. Pain is worsening with movement and therefore limited his mobility. Increasing pain when he lays on the affected shoulder. Pain not adequately relieved despite taking Advil, and using a pillow to support. Pain does radiate mildly towards the right side of his neck. No associated fever, chills, lightheadedness, dizziness, chest pain, shortness of breath, productive cough, hemoptysis, numbness and weakness to extremities, or rash. He denies any recent strenuous activities or heavy lifting. He denies any recent trauma. He reports having history of recurrent shoulder injury when he was young and had had multiple shoulder dislocations in the past. He has received injection in his right shoulder by his orthopedist 2 years ago with good improvement. He follow-up with [facility]opedic.  Past Medical History  Diagnosis Date  . Prostate cancer     Remission   History reviewed. No pertinent past surgical history. Family History  Problem Relation Age of Onset  . Stroke Mother    Social History  Substance Use Topics  . Smoking status: Never Smoker   . Smokeless tobacco: Never Used  . Alcohol Use: No    Review of Systems  Constitutional: Negative for fever.  Musculoskeletal: Positive for arthralgias.  Skin: Negative for rash and wound.  Neurological: Negative for numbness and headaches.      Allergies  Review of patient's allergies indicates no known allergies.  Home Medications   Prior to Admission medications   Medication  Sig Start Date End Date Taking? Authorizing Provider  amoxicillin-clavulanate (AUGMENTIN) 875-125 MG per tablet Take 1 tablet by mouth every 12 (twelve) hours. 02/18/13   Fransico Meadow, PA-C  benzonatate (TESSALON) 100 MG capsule Take 1 capsule (100 mg total) by mouth 3 (three) times daily as needed for cough. 03/19/13   Audelia Hives Presson, PA  cetirizine (ZYRTEC) 10 MG tablet Take 1 tablet (10 mg total) by mouth daily. 03/19/13   Audelia Hives Presson, PA  fluticasone (FLONASE) 50 MCG/ACT nasal spray Place 2 sprays into both nostrils daily. 03/19/13   Audelia Hives Presson, PA  ibuprofen (ADVIL,MOTRIN) 600 MG tablet Take 1 tablet (600 mg total) by mouth every 6 (six) hours as needed. 12/02/12   Antonietta Breach, PA-C  methocarbamol (ROBAXIN) 500 MG tablet Take 500 mg by mouth 4 (four) times daily.    Historical Provider, MD  oxyCODONE-acetaminophen (PERCOCET/ROXICET) 5-325 MG per tablet Take 1-2 tablets by mouth every 6 (six) hours as needed for severe pain. 12/02/12   Antonietta Breach, PA-C  oxyCODONE-acetaminophen (PERCOCET/ROXICET) 5-325 MG per tablet Take 1 tablet by mouth every 6 (six) hours as needed for severe pain. 12/09/12   Merryl Hacker, MD   BP 168/81 mmHg  Pulse 103  Temp(Src) 97.8 F (36.6 C) (Oral)  Resp 16  Ht 5\' 10"  (1.778 m)  Wt 236 lb 4 oz (107.162 kg)  BMI 33.90 kg/m2  SpO2 98% Physical Exam  Constitutional: He appears well-developed and well-nourished. No distress.  HENT:  Head: Atraumatic.  Eyes: Conjunctivae are normal.  Neck: Neck supple.  Musculoskeletal: He exhibits tenderness (Right  shoulder: Tenderness to glenoid and along the lateral deltoid on palpation. Limited range of motion both active and passive secondary to pain. No deformity noted. Sensation intact. Intact distal pulses.).  No cervical midline spine tenderness crepitus or step-off.  Neurological: He is alert.  Skin: No rash noted.  Psychiatric: He has a normal mood and affect.  Nursing note and vitals  reviewed.   ED Course  Procedures (including critical care time)  Patient with history of shoulder dislocation here with right shoulder pain and decreased range of motion. He appears to exhibit symptoms of frozen shoulder. He is neurovascularly intact. X-ray obtained, pain medication given.  10:19 PM Xray showing degenerative changes but no acute fx/dislocation.  Plan to provide for pt muscle relaxant and a short course of pain medication.  Recommend f/u with pcp or orthopedist at [facility] ortho for further care.  Otherwise pt is NVI.  Also recommend regular ROM exercise to improve sxs of adhesive capsulititis  Labs Review Labs Reviewed - No data to display  Imaging Review Dg Shoulder Right  09/28/2014   CLINICAL DATA:  Patient is been and pain since 2:30 a.m. Scapular pain radiating to the right arm. History of previous shoulder dislocations. History of prostate cancer.  EXAM: RIGHT SHOULDER - 2+ VIEW  COMPARISON:  None.  FINDINGS: Degenerative changes in the acromioclavicular and glenohumeral joints. No evidence of acute fracture or dislocation of the right shoulder. No focal bone lesion or bone destruction. Acromioclavicular and coracoclavicular spaces are not widened. Visualized soft tissues are unremarkable.  IMPRESSION: Degenerative changes in the right shoulder. No acute fracture or dislocation.   Electronically Signed   By: Lucienne Capers M.D.   On: 09/28/2014 22:13   I have personally reviewed and evaluated these images and lab results as part of my medical decision-making.   EKG Interpretation None      MDM   Final diagnoses:  Adhesive capsulitis of right shoulder    BP 151/73 mmHg  Pulse 80  Temp(Src) 97.8 F (36.6 C) (Oral)  Resp 16  Ht 5\' 10"  (1.778 m)  Wt 236 lb 4 oz (107.162 kg)  BMI 33.90 kg/m2  SpO2 99%     Domenic Moras, PA-C 09/28/14 2235  Wandra Arthurs, MD 09/29/14 [phone]

## 2015-07-24 ENCOUNTER — Ambulatory Visit
Admission: RE | Admit: 2015-07-24 | Discharge: 2015-07-24 | Disposition: A | Discharge status: Home / Self Care | Payer: Medicare Other | Source: Ambulatory Visit | Attending: Family Medicine | Admitting: Family Medicine

## 2015-07-24 ENCOUNTER — Other Ambulatory Visit: Payer: Self-pay | Admitting: Family Medicine

## 2015-07-24 DIAGNOSIS — R0989 Other specified symptoms and signs involving the circulatory and respiratory systems: Secondary | ICD-10-CM

## 2015-08-31 ENCOUNTER — Ambulatory Visit (INDEPENDENT_AMBULATORY_CARE_PROVIDER_SITE_OTHER): Payer: Medicare Other | Admitting: Internal Medicine

## 2015-08-31 ENCOUNTER — Encounter: Payer: Self-pay | Admitting: Internal Medicine

## 2015-08-31 VITALS — BP 150/80 | HR 82 | Ht 70.0 in | Wt 229.0 lb

## 2015-08-31 DIAGNOSIS — I10 Essential (primary) hypertension: Secondary | ICD-10-CM | POA: Insufficient documentation

## 2015-08-31 DIAGNOSIS — R918 Other nonspecific abnormal finding of lung field: Secondary | ICD-10-CM | POA: Insufficient documentation

## 2015-08-31 NOTE — Progress Notes (Signed)
Subjective:     Patient ID: Marcus Gomez, male   DOB: [birth year],     MRN: [id number]  HPI  86 yowm never smoker with seasonal rhinitis with 3 courses of shots under Dr Lorin Picket completely resolved s need for meds with acute severe aches/fever/ anorexia / mostly dry  cough Juy 4th 2017 all acute symptoms  resolved p one week rx p abx >  referred to pulmonary clinic 08/31/2015 by Dr Rex Kras for abn lung exam ? PF in setting of fm hx RA/lung dz in father   08/31/2015 1st [facility] office visit/ Marcus Gomez   Chief Complaint  Patient presents with  . Pulmonary Consult    Referred by Dr. Tamsen Roers for eval of abnormal lung sounds. He c/o cough for the past 3 months- occ prod with very min, clear sputum.   never sob even when acutely ill  Works in yard work / lots of tick of exposure prior to acute illness. Notes from Dr Rex Kras say diffuse velcro crackles on exam 07/30/15 but pt feels back to baseline with min cough just like he's had for years, mostly in am p stirring = more of a throat clearing than true cough   No obvious day to day or daytime variability or assoc excess/ purulent sputum or mucus plugs or hemoptysis or cp or chest tightness, subjective wheeze or overt sinus or hb symptoms. No unusual exp hx or h/o childhood pna/ asthma or knowledge of premature birth.  Sleeping ok without nocturnal  or early am exacerbation  of respiratory  c/o's or need for noct saba. Also denies any obvious fluctuation of symptoms with weather or environmental changes or other aggravating or alleviating factors except as outlined above   Current Medications, Allergies, Complete Past Medical History, Past Surgical History, Family History, and Social History were reviewed in [employer] record.  ROS  The following are not active complaints unless bolded sore throat, dysphagia, dental problems, itching, sneezing,  nasal congestion or excess/ purulent secretions, ear ache,   fever,  chills, sweats, unintended wt loss, classically pleuritic or exertional cp,  orthopnea pnd or leg swelling, presyncope, palpitations, abdominal pain, anorexia, nausea, vomiting, diarrhea  or change in bowel or bladder habits, change in stools or urine, dysuria,hematuria,  rash, arthralgias, visual complaints, headache, numbness, weakness or ataxia or problems with walking or coordination,  change in mood/affect or memory.             Review of Systems     Objective:   Physical Exam    amb wm nad   Wt Readings from Last 3 Encounters:  08/31/15 229 lb (103.9 kg)  09/28/14 236 lb 4 oz (107.2 kg)  12/09/12 225 lb 14.4 oz (102.5 kg)    Vital signs reviewed - note bp slightly elevated    HEENT: nl dentition, turbinates, and oropharynx. Nl external ear canals without cough reflex   NECK :  without JVD/Nodes/TM/ nl carotid upstrokes bilaterally   LUNGS: no acc muscle use,  Nl contour chest which is clear to A and P bilaterally with very minimal insp crackles bilaterally and no cough on deep insp maneuvers   CV:  RRR  no s3 or murmur or increase in P2, no edema   ABD:  soft and nontender with nl inspiratory excursion in the supine position. No bruits or organomegaly, bowel sounds nl  MS:  Nl gait/ ext warm without deformities, calf tenderness, cyanosis or clubbing No obvious joint restrictions  SKIN: warm and dry without lesions    NEURO:  alert, approp, nl sensorium with  no motor deficits     I personally reviewed images and agree with radiology impression as follows:  CXR:   07/24/15  No active cardiopulmonary disease.   Assessment:

## 2015-08-31 NOTE — Assessment & Plan Note (Signed)
Acute viral like illness 07/14/15 with nl cxr 07/24/15 - 08/31/2015  Walked RA x 3 laps @ 185 ft each stopped due to  End of study, fast pace, no sob or desat    Most likely ALI related to viral illness, already has resolved back to baseline with no sign cough or ex intolerance.  He could also have a form of ILD that acutely  exacerbates like UIP or HSP or CHF but none of these really fit here and  I would not pursue further w/u unless occurs again and then proceed directly to HRCT chest   Total time devoted to counseling  = 35/25m review case with pt/ discussion of options/alternatives/ personally creating written instructions  in presence of pt  then going over those specific  Instructions directly with the pt including how to use all of the meds but in particular covering each new medication in detail and the difference between the maintenance/automatic meds and the prns using an action plan format for the latter.

## 2015-08-31 NOTE — Patient Instructions (Signed)
Return here for a decline in exercise tolerance due to your breathing

## 2015-08-31 NOTE — Assessment & Plan Note (Signed)
Very anxious about Pos fm hx so no need for rx for now > Follow up per Primary Care planned

## 2016-03-16 ENCOUNTER — Encounter: Payer: Self-pay | Admitting: Adult Health

## 2016-03-16 ENCOUNTER — Ambulatory Visit (INDEPENDENT_AMBULATORY_CARE_PROVIDER_SITE_OTHER): Payer: PPO | Admitting: Adult Health

## 2016-03-16 VITALS — BP 162/82 | HR 70 | Temp 98.7°F | Ht 70.0 in | Wt 243.0 lb

## 2016-03-16 DIAGNOSIS — Z8546 Personal history of malignant neoplasm of prostate: Secondary | ICD-10-CM | POA: Diagnosis not present

## 2016-03-16 DIAGNOSIS — I10 Essential (primary) hypertension: Secondary | ICD-10-CM

## 2016-03-16 DIAGNOSIS — Z0001 Encounter for general adult medical examination with abnormal findings: Secondary | ICD-10-CM

## 2016-03-16 DIAGNOSIS — Z23 Encounter for immunization: Secondary | ICD-10-CM | POA: Diagnosis not present

## 2016-03-16 DIAGNOSIS — Z0000 Encounter for general adult medical examination without abnormal findings: Secondary | ICD-10-CM

## 2016-03-16 LAB — TSH: TSH: 1.95 u[IU]/mL (ref 0.35–4.50)

## 2016-03-16 LAB — POC URINALSYSI DIPSTICK (AUTOMATED)
BILIRUBIN UA: NEGATIVE
Glucose, UA: NEGATIVE
KETONES UA: NEGATIVE
Leukocytes, UA: NEGATIVE
Nitrite, UA: NEGATIVE
PH UA: 6
Protein, UA: NEGATIVE
RBC UA: NEGATIVE
Spec Grav, UA: 1.02
Urobilinogen, UA: 0.2

## 2016-03-16 LAB — LIPID PANEL
Cholesterol: 197 mg/dL (ref 0–200)
HDL: 52.7 mg/dL (ref >39.00)
LDL Cholesterol: 127 mg/dL — ABNORMAL HIGH (ref 0–99)
NONHDL: 144.73
Total CHOL/HDL Ratio: 4
Triglycerides: 91 mg/dL (ref 0.0–149.0)
VLDL: 18.2 mg/dL (ref 0.0–40.0)

## 2016-03-16 LAB — BASIC METABOLIC PANEL
BUN: 13 mg/dL (ref 6–23)
CHLORIDE: 105 meq/L (ref 96–112)
CO2: 25 mEq/L (ref 19–32)
CREATININE: 0.84 mg/dL (ref 0.40–1.50)
Calcium: 9.5 mg/dL (ref 8.4–10.5)
GFR: 95.73 mL/min (ref >60.00)
Glucose, Bld: 105 mg/dL — ABNORMAL HIGH (ref 70–99)
Potassium: 4.3 mEq/L (ref 3.5–5.1)
Sodium: 138 mEq/L (ref 135–145)

## 2016-03-16 LAB — CBC WITH DIFFERENTIAL/PLATELET
BASOS PCT: 0.6 % (ref 0.0–3.0)
Basophils Absolute: 0 10*3/uL (ref 0.0–0.1)
EOS PCT: 2.5 % (ref 0.0–5.0)
Eosinophils Absolute: 0.2 10*3/uL (ref 0.0–0.7)
HEMATOCRIT: 42.9 % (ref 39.0–52.0)
Hemoglobin: 14.4 g/dL (ref 13.0–17.0)
Lymphocytes Relative: 21.6 % (ref 12.0–46.0)
Lymphs Abs: 1.5 10*3/uL (ref 0.7–4.0)
MCHC: 33.6 g/dL (ref 30.0–36.0)
MCV: 89 fl (ref 78.0–100.0)
Monocytes Absolute: 0.6 10*3/uL (ref 0.1–1.0)
Monocytes Relative: 8.1 % (ref 3.0–12.0)
NEUTROS ABS: 4.7 10*3/uL (ref 1.4–7.7)
Neutrophils Relative %: 67.2 % (ref 43.0–77.0)
PLATELETS: 205 10*3/uL (ref 150.0–400.0)
RBC: 4.82 Mil/uL (ref 4.22–5.81)
RDW: 13.4 % (ref 11.5–15.5)
WBC: 7 10*3/uL (ref 4.0–10.5)

## 2016-03-16 LAB — HEMOGLOBIN A1C: Hgb A1c MFr Bld: 5.7 % (ref 4.6–6.5)

## 2016-03-16 LAB — HEPATIC FUNCTION PANEL
ALK PHOS: 76 U/L (ref 39–117)
ALT: 15 U/L (ref 0–53)
AST: 15 U/L (ref 0–37)
Albumin: 4.2 g/dL (ref 3.5–5.2)
BILIRUBIN DIRECT: 0.2 mg/dL (ref 0.0–0.3)
Total Bilirubin: 1 mg/dL (ref 0.2–1.2)
Total Protein: 6.8 g/dL (ref 6.0–8.3)

## 2016-03-16 LAB — PSA: PSA: 0.01 ng/mL — ABNORMAL LOW (ref 0.10–4.00)

## 2016-03-16 MED ORDER — LISINOPRIL 20 MG PO TABS: 20.0000 mg | ORAL_TABLET | Freq: Every day | ORAL | 1 refills | Status: DC

## 2016-03-16 NOTE — Patient Instructions (Signed)
It was great meeting you today!  I will follow up with you on your lab work.   I have called in a blood pressure medication called Lisinopril - take this daily.   Please follow up with me in 2 weeks  Health Maintenance, Male A healthy lifestyle and preventative care can promote health and wellness.  Maintain regular health, dental, and eye exams.  Eat a healthy diet. Foods like vegetables, fruits, whole grains, low-fat dairy products, and lean protein foods contain the nutrients you need and are low in calories. Decrease your intake of foods high in solid fats, added sugars, and salt. Get information about a proper diet from your health care provider, if necessary.  Regular physical exercise is one of the most important things you can do for your health. Most adults should get at least 150 minutes of moderate-intensity exercise (any activity that increases your heart rate and causes you to sweat) each week. In addition, most adults need muscle-strengthening exercises on 2 or more days a week.   Maintain a healthy weight. The body mass index (BMI) is a screening tool to identify possible weight problems. It provides an estimate of body fat based on height and weight. Your health care provider can find your BMI and can help you achieve or maintain a healthy weight. For males 20 years and older:  A BMI below 18.5 is considered underweight.  A BMI of 18.5 to 24.9 is normal.  A BMI of 25 to 29.9 is considered overweight.  A BMI of 30 and above is considered obese.  Maintain normal blood lipids and cholesterol by exercising and minimizing your intake of saturated fat. Eat a balanced diet with plenty of fruits and vegetables. Blood tests for lipids and cholesterol should begin at age 83 and be repeated every 5 years. If your lipid or cholesterol levels are high, you are over age 60, or you are at high risk for heart disease, you may need your cholesterol levels checked more frequently.Ongoing  high lipid and cholesterol levels should be treated with medicines if diet and exercise are not working.  If you smoke, find out from your health care provider how to quit. If you do not use tobacco, do not start.  Lung cancer screening is recommended for adults aged 57-80 years who are at high risk for developing lung cancer because of a history of smoking. A yearly low-dose CT scan of the lungs is recommended for people who have at least a 30-pack-year history of smoking and are current smokers or have quit within the past 15 years. A pack year of smoking is smoking an average of 1 pack of cigarettes a day for 1 year (for example, a 30-pack-year history of smoking could mean smoking 1 pack a day for 30 years or 2 packs a day for 15 years). Yearly screening should continue until the smoker has stopped smoking for at least 15 years. Yearly screening should be stopped for people who develop a health problem that would prevent them from having lung cancer treatment.  If you choose to drink alcohol, do not have more than 2 drinks per day. One drink is considered to be 12 oz (360 mL) of beer, 5 oz (150 mL) of wine, or 1.5 oz (45 mL) of liquor.  Avoid the use of street drugs. Do not share needles with anyone. Ask for help if you need support or instructions about stopping the use of drugs.  High blood pressure causes heart disease and  increases the risk of stroke. High blood pressure is more likely to develop in:  People who have blood pressure in the end of the normal range (100-139/85-89 mm Hg).  People who are overweight or obese.  People who are African American.  If you are 60-62 years of age, have your blood pressure checked every 3-5 years. If you are 51 years of age or older, have your blood pressure checked every year. You should have your blood pressure measured twice--once when you are at a hospital or clinic, and once when you are not at a hospital or clinic. Record the average of the two  measurements. To check your blood pressure when you are not at a hospital or clinic, you can use:  An automated blood pressure machine at a pharmacy.  A home blood pressure monitor.  If you are 1-33 years old, ask your health care provider if you should take aspirin to prevent heart disease.  Diabetes screening involves taking a blood sample to check your fasting blood sugar level. This should be done once every 3 years after age 61 if you are at a normal weight and without risk factors for diabetes. Testing should be considered at a younger age or be carried out more frequently if you are overweight and have at least 1 risk factor for diabetes.  Colorectal cancer can be detected and often prevented. Most routine colorectal cancer screening begins at the age of 14 and continues through age 10. However, your health care provider may recommend screening at an earlier age if you have risk factors for colon cancer. On a yearly basis, your health care provider may provide home test kits to check for hidden blood in the stool. A small camera at the end of a tube may be used to directly examine the colon (sigmoidoscopy or colonoscopy) to detect the earliest forms of colorectal cancer. Talk to your health care provider about this at age 87 when routine screening begins. A direct exam of the colon should be repeated every 5-10 years through age 60, unless early forms of precancerous polyps or small growths are found.  People who are at an increased risk for hepatitis B should be screened for this virus. You are considered at high risk for hepatitis B if:  You were born in a country where hepatitis B occurs often. Talk with your health care provider about which countries are considered high risk.  Your parents were born in a high-risk country and you have not received a shot to protect against hepatitis B (hepatitis B vaccine).  You have HIV or AIDS.  You use needles to inject street drugs.  You live  with, or have sex with, someone who has hepatitis B.  You are a man who has sex with other men (MSM).  You get hemodialysis treatment.  You take certain medicines for conditions like cancer, organ transplantation, and autoimmune conditions.  Hepatitis C blood testing is recommended for all people born from [birth year] through 1965 and any individual with known risk factors for hepatitis C.  Healthy men should no longer receive prostate-specific antigen (PSA) blood tests as part of routine cancer screening. Talk to your health care provider about prostate cancer screening.  Testicular cancer screening is not recommended for adolescents or adult males who have no symptoms. Screening includes self-exam, a health care provider exam, and other screening tests. Consult with your health care provider about any symptoms you have or any concerns you have about testicular cancer.  Practice safe sex. Use condoms and avoid high-risk sexual practices to reduce the spread of sexually transmitted infections (STIs).  You should be screened for STIs, including gonorrhea and chlamydia if:  You are sexually active and are younger than 24 years.  You are older than 24 years, and your health care provider tells you that you are at risk for this type of infection.  Your sexual activity has changed since you were last screened, and you are at an increased risk for chlamydia or gonorrhea. Ask your health care provider if you are at risk.  If you are at risk of being infected with HIV, it is recommended that you take a prescription medicine daily to prevent HIV infection. This is called pre-exposure prophylaxis (PrEP). You are considered at risk if:  You are a man who has sex with other men (MSM).  You are a heterosexual man who is sexually active with multiple partners.  You take drugs by injection.  You are sexually active with a partner who has HIV.  Talk with your health care provider about whether you are at  high risk of being infected with HIV. If you choose to begin PrEP, you should first be tested for HIV. You should then be tested every 3 months for as long as you are taking PrEP.  Use sunscreen. Apply sunscreen liberally and repeatedly throughout the day. You should seek shade when your shadow is shorter than you. Protect yourself by wearing long sleeves, pants, a wide-brimmed hat, and sunglasses year round whenever you are outdoors.  Tell your health care provider of new moles or changes in moles, especially if there is a change in shape or color. Also, tell your health care provider if a mole is larger than the size of a pencil eraser.  A one-time screening for abdominal aortic aneurysm (AAA) and surgical repair of large AAAs by ultrasound is recommended for men aged 72-75 years who are current or former smokers.  Stay current with your vaccines (immunizations).   This information is not intended to replace advice given to you by your health care provider. Make sure you discuss any questions you have with your health care provider.   Document Released: 06/25/2007 Document Revised: 01/17/2014 Document Reviewed: 05/24/2010 Elsevier Interactive Patient Education [employer].

## 2016-03-16 NOTE — Progress Notes (Signed)
Patient presents to clinic today to establish care. He is a pleasant 71 year old male who  has a past medical history of Prostate cancer ([place]).  His last physical was three years ago  Acute Concerns: Establish Care   Chronic Issues: Essential Hypertension - His blood pressure has been elevated during this last few visits. He has never been on any medication in the past for blood pressure management. Denies any headaches or blurred vision.   Health Maintenance: Dental --Routine  Vision -- Routine  Immunizations -- Needs pneumonia shot  Colonoscopy -- 2015? - One polyp  Diet: Does not eat healthy  Exercise: Does not do regular exercise.   He is followed by  Urology - prostate Cancer [place]   BP Readings from Last 3 Encounters:  03/16/16 (!) 162/82  08/31/15 (!) 150/80  09/28/14 151/73     Past Medical History:  Diagnosis Date  . Prostate cancer ([place])    Remission    Past Surgical History:  Procedure Laterality Date  . KNEE SURGERY Bilateral 2015 & 2014  . SHOULDER SURGERY Right 2016    No current outpatient prescriptions on file prior to visit.   No current facility-administered medications on file prior to visit.     No Known Allergies  Family History  Problem Relation Age of Onset  . Stroke Mother   . Allergies Father   . COPD Father     never smoker  . Rheum arthritis Father     Social History   Social History  . Marital status: Married    Spouse name: N/A  . Number of children: N/A  . Years of education: N/A   Occupational History  . Not on file.   Social History Main Topics  . Smoking status: Never Smoker  . Smokeless tobacco: Never Used  . Alcohol use Yes     Comment: socially   . Drug use: No  . Sexual activity: Yes   Other Topics Concern  . 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           Review of Systems  Constitutional: Negative.    HENT: Negative.   Eyes: Negative.   Respiratory: Negative.   Cardiovascular: Negative.   Gastrointestinal: Negative.   Genitourinary: Negative.   Musculoskeletal: Negative.   Skin: Negative.   Neurological: Negative.   Endo/Heme/Allergies: Negative.   Psychiatric/Behavioral: Negative.   All other systems reviewed and are negative.   BP (!) 162/82 (BP Location: Left Arm, Patient Position: Sitting, Cuff Size: Normal)   Pulse 70   Temp 98.7 F (37.1 C) (Oral)   Ht 5\' 10"  (1.778 m)   Wt 243 lb (110.2 kg)   SpO2 95%   BMI 34.87 kg/m   Physical Exam  Constitutional: He is oriented to person, place, and time and well-developed, well-nourished, and in no distress. No distress.  HENT:  Head: Normocephalic and atraumatic.  Right Ear: External ear normal.  Left Ear: External ear normal.  Nose: Nose normal.  Mouth/Throat: Oropharynx is clear and moist. No oropharyngeal exudate.  Eyes: Conjunctivae and EOM are normal. Pupils are equal, round, and reactive to light. Right eye exhibits no discharge. Left eye exhibits no discharge. No scleral icterus.  Neck: Trachea normal and normal range of motion. Neck supple. No JVD present. No tracheal tenderness present. Carotid bruit is not present.  No tracheal deviation present. No thyroid mass and no thyromegaly present.  Cardiovascular: Normal rate, regular rhythm, normal heart sounds and intact distal pulses.  Exam reveals no gallop and no friction rub.   No murmur heard. Pulmonary/Chest: Effort normal and breath sounds normal. No stridor. No respiratory distress. He has no wheezes. He has no rales. He exhibits no tenderness.  Abdominal: Soft. Bowel sounds are normal. He exhibits no distension and no mass. There is no tenderness. There is no rebound and no guarding.  Genitourinary: Rectum normal and prostate normal. Rectal exam shows guaiac negative stool.  Musculoskeletal: Normal range of motion. He exhibits no edema, tenderness or deformity.    Lymphadenopathy:    He has no cervical adenopathy.  Neurological: He is alert and oriented to person, place, and time. He displays normal reflexes. No cranial nerve deficit. He exhibits normal muscle tone. Gait normal. Coordination normal. GCS score is 15.  Skin: Skin is warm and dry. No rash noted. He is not diaphoretic. No erythema. No pallor.  Psychiatric: Mood, memory, affect and judgment normal.  Nursing note and vitals reviewed.  Assessment/Plan: 1. Routine general medical examination at a health care facility - Needs to lose weight and start eating a heart healthy diet - TSH - PSA - POCT Urinalysis Dipstick (Automated) - Lipid panel - Hepatic function panel - Hemoglobin A1c - CBC with Differential/Platelet - Basic metabolic panel  2. Essential hypertension - Will start on lisinopril 20 mg.  - educated on the importance of diet and exercise - TSH - PSA - POCT Urinalysis Dipstick (Automated) - Lipid panel - Hepatic function panel - Hemoglobin A1c - CBC with Differential/Platelet - Basic metabolic panel - lisinopril (PRINIVIL,ZESTRIL) 20 MG tablet; Take 1 tablet (20 mg total) by mouth daily.  Dispense: 90 tablet; Refill: 1 - Follow up in 2 weeks   [name], NP

## 2016-03-23 ENCOUNTER — Ambulatory Visit (HOSPITAL_COMMUNITY)
Admission: RE | Admit: 2016-03-23 | Discharge: 2016-03-23 | Disposition: A | Discharge status: Home / Self Care | Payer: PPO | Source: Ambulatory Visit | Attending: Chiropractic Medicine | Admitting: Chiropractic Medicine

## 2016-03-23 ENCOUNTER — Other Ambulatory Visit (HOSPITAL_COMMUNITY): Payer: Self-pay | Admitting: Chiropractic Medicine

## 2016-03-23 DIAGNOSIS — M542 Cervicalgia: Secondary | ICD-10-CM

## 2016-03-23 DIAGNOSIS — M5030 Other cervical disc degeneration, unspecified cervical region: Secondary | ICD-10-CM | POA: Diagnosis not present

## 2016-03-28 DIAGNOSIS — H11443 Conjunctival cysts, bilateral: Secondary | ICD-10-CM | POA: Diagnosis not present

## 2016-03-28 DIAGNOSIS — H25013 Cortical age-related cataract, bilateral: Secondary | ICD-10-CM | POA: Diagnosis not present

## 2016-03-28 DIAGNOSIS — H04123 Dry eye syndrome of bilateral lacrimal glands: Secondary | ICD-10-CM | POA: Diagnosis not present

## 2016-03-28 DIAGNOSIS — H25043 Posterior subcapsular polar age-related cataract, bilateral: Secondary | ICD-10-CM | POA: Diagnosis not present

## 2016-03-28 DIAGNOSIS — H2513 Age-related nuclear cataract, bilateral: Secondary | ICD-10-CM | POA: Diagnosis not present

## 2016-04-07 DIAGNOSIS — H18413 Arcus senilis, bilateral: Secondary | ICD-10-CM | POA: Diagnosis not present

## 2016-04-07 DIAGNOSIS — H40052 Ocular hypertension, left eye: Secondary | ICD-10-CM | POA: Diagnosis not present

## 2016-04-07 DIAGNOSIS — H353121 Nonexudative age-related macular degeneration, left eye, early dry stage: Secondary | ICD-10-CM | POA: Diagnosis not present

## 2016-04-07 DIAGNOSIS — H11153 Pinguecula, bilateral: Secondary | ICD-10-CM | POA: Diagnosis not present

## 2016-04-07 DIAGNOSIS — H2513 Age-related nuclear cataract, bilateral: Secondary | ICD-10-CM | POA: Diagnosis not present

## 2016-04-07 DIAGNOSIS — H25013 Cortical age-related cataract, bilateral: Secondary | ICD-10-CM | POA: Diagnosis not present

## 2016-04-07 DIAGNOSIS — H04123 Dry eye syndrome of bilateral lacrimal glands: Secondary | ICD-10-CM | POA: Diagnosis not present

## 2016-04-07 DIAGNOSIS — H35371 Puckering of macula, right eye: Secondary | ICD-10-CM | POA: Diagnosis not present

## 2016-04-07 DIAGNOSIS — H11423 Conjunctival edema, bilateral: Secondary | ICD-10-CM | POA: Diagnosis not present

## 2016-04-07 DIAGNOSIS — H353210 Exudative age-related macular degeneration, right eye, stage unspecified: Secondary | ICD-10-CM | POA: Diagnosis not present

## 2016-04-07 DIAGNOSIS — H25043 Posterior subcapsular polar age-related cataract, bilateral: Secondary | ICD-10-CM | POA: Diagnosis not present

## 2016-04-07 DIAGNOSIS — H40013 Open angle with borderline findings, low risk, bilateral: Secondary | ICD-10-CM | POA: Diagnosis not present

## 2016-04-18 ENCOUNTER — Encounter (INDEPENDENT_AMBULATORY_CARE_PROVIDER_SITE_OTHER): Payer: Self-pay | Admitting: Ophthalmology

## 2016-04-18 DIAGNOSIS — H353131 Nonexudative age-related macular degeneration, bilateral, early dry stage: Secondary | ICD-10-CM | POA: Diagnosis not present

## 2016-04-18 DIAGNOSIS — I10 Essential (primary) hypertension: Secondary | ICD-10-CM

## 2016-04-18 DIAGNOSIS — H35033 Hypertensive retinopathy, bilateral: Secondary | ICD-10-CM | POA: Diagnosis not present

## 2016-04-18 DIAGNOSIS — H35371 Puckering of macula, right eye: Secondary | ICD-10-CM | POA: Diagnosis not present

## 2016-04-18 DIAGNOSIS — H43813 Vitreous degeneration, bilateral: Secondary | ICD-10-CM

## 2016-04-18 DIAGNOSIS — H2513 Age-related nuclear cataract, bilateral: Secondary | ICD-10-CM

## 2016-05-17 ENCOUNTER — Encounter: Payer: Self-pay | Admitting: Adult Health

## 2016-05-17 ENCOUNTER — Ambulatory Visit (INDEPENDENT_AMBULATORY_CARE_PROVIDER_SITE_OTHER): Payer: PPO | Admitting: Adult Health

## 2016-05-17 VITALS — BP 132/68 | Ht 70.0 in | Wt 234.5 lb

## 2016-05-17 DIAGNOSIS — I10 Essential (primary) hypertension: Secondary | ICD-10-CM

## 2016-05-17 NOTE — Progress Notes (Signed)
Subjective:    Patient ID: Marcus Gomez., male    DOB: [birth year], 71 y.o.   MRN: [id number]  HPI  71 year old male who  has a past medical history of Essential hypertension and Prostate cancer ([place]). He presents to the office today for follow up regarding hypertension. During his last visit he was started on Lisinopril 20 mg.   Today in the office he reports that he has not noticed any side effects from Lisinopril. He has also reports that he has been working on weight loss through diet and exercise.   BP Readings from Last 3 Encounters:  05/17/16 132/68  03/16/16 (!) 162/82  08/31/15 (!) 150/80   Wt Readings from Last 3 Encounters:  05/17/16 234 lb 8 oz (106.4 kg)  03/16/16 243 lb (110.2 kg)  08/31/15 229 lb (103.9 kg)    Review of Systems Negative unless mentioned above   Past Medical History:  Diagnosis Date  . Essential hypertension   . Prostate cancer ([place])    Remission    Social History   Social History  . Marital status: Divorced    Spouse name: N/A  . Number of children: N/A  . Years of education: N/A   Occupational History  . Not on file.   Social History Main Topics  . Smoking status: Never Smoker  . Smokeless tobacco: Never Used  . Alcohol use Yes     Comment: socially   . Drug use: No  . Sexual activity: Yes   Other Topics Concern  . 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           Past Surgical History:  Procedure Laterality Date  . KNEE SURGERY Bilateral 2015 & 2014  . SHOULDER SURGERY Right 2016    Family History  Problem Relation Age of Onset  . Stroke Mother   . Allergies Father   . COPD Father     never smoker  . Rheum arthritis Father     No Known Allergies  Current Outpatient Prescriptions on File Prior to Visit  Medication Sig Dispense Refill  . lisinopril (PRINIVIL,ZESTRIL) 20 MG tablet Take 1 tablet (20 mg total) by  mouth daily. 90 tablet 1   No current facility-administered medications on file prior to visit.     BP 132/68 (BP Location: Left Arm, Patient Position: Sitting, Cuff Size: Normal)   Ht 5\' 10"  (1.778 m)   Wt 234 lb 8 oz (106.4 kg)   BMI 33.65 kg/m       Objective:   Physical Exam  Constitutional: He is oriented to person, place, and time. He appears well-developed and well-nourished. No distress.  Cardiovascular: Normal rate, regular rhythm and intact distal pulses.  Exam reveals no gallop and no friction rub.   No murmur heard. Pulmonary/Chest: Effort normal and breath sounds normal. No respiratory distress. He has no wheezes. He has no rales. He exhibits no tenderness.  Neurological: He is alert and oriented to person, place, and time.  Skin: Skin is warm and dry. No rash noted. He is not diaphoretic. No erythema. No pallor.  Psychiatric: He has a normal mood and affect. His behavior is normal. Judgment and thought content normal.  Nursing note and vitals reviewed.     Assessment & Plan:  1. Essential hypertension - near goal with current dose of lisinopril.  I am going to keep him at this dose. He has the drive to lose weight as he would like to eventually get off lisinopril.  - Follow up for CPE or sooner if needed  [name], NP

## 2016-08-11 ENCOUNTER — Encounter: Payer: Self-pay | Admitting: Adult Health

## 2016-08-11 ENCOUNTER — Ambulatory Visit (INDEPENDENT_AMBULATORY_CARE_PROVIDER_SITE_OTHER): Payer: PPO | Admitting: Adult Health

## 2016-08-11 VITALS — BP 152/78 | Temp 98.0°F | Wt 237.0 lb

## 2016-08-11 DIAGNOSIS — I10 Essential (primary) hypertension: Secondary | ICD-10-CM

## 2016-08-11 MED ORDER — LOSARTAN POTASSIUM 50 MG PO TABS: 50.0000 mg | ORAL_TABLET | Freq: Every day | ORAL | 3 refills | Status: DC

## 2016-08-11 NOTE — Patient Instructions (Signed)
It was great seeing you today   I have sent in a new prescription for a medication called Cozaar   Follow up with me in 2 weeks to see how your blood pressure is responding

## 2016-08-11 NOTE — Progress Notes (Signed)
Subjective:    Patient ID: [name], male    DOB: March 26, 1945, 71 y.o.   MRN: [id number]  HPI  71 year old male who  has a past medical history of Essential hypertension and Prostate cancer ([place]). He presents to the office today for follow up regarding hypertension. He was started on Lisinopril 20 mg 5 months ago. He has since stopped the medication as he developed a dry cough. He continued with the medication until last week when the cough became worse. He stopped taking lisinopril last week and the cough started to resolve.   He has not taken lisinopril since. He denies any headaches or blurred vision.    Review of Systems See HPI   Past Medical History:  Diagnosis Date  . Essential hypertension   . Prostate cancer ([place])    Remission    Social History   Social History  . Marital status: Divorced    Spouse name: N/A  . Number of children: N/A  . Years of education: N/A   Occupational History  . Not on file.   Social History Main Topics  . Smoking status: Never Smoker  . Smokeless tobacco: Never Used  . Alcohol use Yes     Comment: socially   . Drug use: No  . Sexual activity: Yes   Other Topics Concern  . 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           Past Surgical History:  Procedure Laterality Date  . KNEE SURGERY Bilateral 2015 & 2014  . SHOULDER SURGERY Right 2016    Family History  Problem Relation Age of Onset  . Stroke Mother   . Allergies Father   . COPD Father        never smoker  . Rheum arthritis Father     Allergies  Allergen Reactions  . Lisinopril Cough    Current Outpatient Prescriptions on File Prior to Visit  Medication Sig Dispense Refill  . Ketotifen Fumarate (ALLERGY EYE DROPS OP) Apply to eye daily as needed.     No current facility-administered medications on file prior to visit.     BP (!) 152/78 (BP Location:  Left Arm)   Temp 98 F (36.7 C) (Oral)   Wt 237 lb (107.5 kg)   BMI 34.01 kg/m       Objective:   Physical Exam  Constitutional: He is oriented to person, place, and time. He appears well-developed and well-nourished. No distress.  Cardiovascular: Normal rate, regular rhythm, normal heart sounds and intact distal pulses.  Exam reveals no gallop and no friction rub.   No murmur heard. Pulmonary/Chest: Effort normal and breath sounds normal. No respiratory distress. He has no wheezes. He has no rales. He exhibits no tenderness.  Neurological: He is alert and oriented to person, place, and time.  Skin: Skin is warm and dry. No rash noted. He is not diaphoretic. No erythema. No pallor.  Psychiatric: He has a normal mood and affect. His behavior is normal. Judgment and thought content normal.  Nursing note and vitals reviewed.      Assessment & Plan:  1. Essential hypertension - Will switch to Cozaar. Follow up in 2 weeks  - losartan (COZAAR) 50 MG tablet; Take 1 tablet (50 mg total) by mouth daily.  Dispense: 30 tablet; Refill: Door,  NP

## 2016-08-24 ENCOUNTER — Ambulatory Visit (INDEPENDENT_AMBULATORY_CARE_PROVIDER_SITE_OTHER): Payer: PPO | Admitting: Adult Health

## 2016-08-24 VITALS — BP 132/74 | Temp 98.6°F | Ht 70.5 in | Wt 238.0 lb

## 2016-08-24 DIAGNOSIS — I10 Essential (primary) hypertension: Secondary | ICD-10-CM | POA: Diagnosis not present

## 2016-08-24 MED ORDER — LOSARTAN POTASSIUM 50 MG PO TABS: 50.0000 mg | ORAL_TABLET | Freq: Every day | ORAL | 3 refills | Status: DC

## 2016-08-24 NOTE — Progress Notes (Signed)
Subjective:    Patient ID: Marcus Gomez., male    DOB: 05/06/1945, 71 y.o.   MRN: [id number]  HPI  71 year old male who  has a past medical history of Essential hypertension and Prostate cancer ([place]). He presents to the office today for 2 week follow up d/t hypertension. During his last visit he was taken off Lisinopril d/t dry cough and was placed on Cozaar 50 mg. He was asked to monitor his blood pressure at home.   Today in the office he reports that he has not had any side effects since staking Cozaar and his cough is completely gone. He does not check his blood pressure at home.  Today in the office his blood pressure is 132/74   Review of Systems See HPI   Past Medical History:  Diagnosis Date  . Essential hypertension   . Prostate cancer ([place])    Remission    Social History   Social History  . Marital status: Divorced    Spouse name: N/A  . Number of children: N/A  . Years of education: N/A   Occupational History  . Not on file.   Social History Main Topics  . Smoking status: Never Smoker  . Smokeless tobacco: Never Used  . Alcohol use Yes     Comment: socially   . Drug use: No  . Sexual activity: Yes   Other Topics Concern  . 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           Past Surgical History:  Procedure Laterality Date  . KNEE SURGERY Bilateral 2015 & 2014  . SHOULDER SURGERY Right 2016    Family History  Problem Relation Age of Onset  . Stroke Mother   . Allergies Father   . COPD Father        never smoker  . Rheum arthritis Father     Allergies  Allergen Reactions  . Lisinopril Cough    Current Outpatient Prescriptions on File Prior to Visit  Medication Sig Dispense Refill  . Ketotifen Fumarate (ALLERGY EYE DROPS OP) Apply to eye daily as needed.    Marland Kitchen losartan (COZAAR) 50 MG tablet Take 1 tablet (50 mg total) by mouth daily. 30  tablet 3   No current facility-administered medications on file prior to visit.     BP 132/74 (BP Location: Left Arm)   Temp 98.6 F (37 C) (Oral)   Ht 5' 10.5" (1.791 m)   Wt 238 lb (108 kg)   BMI 33.67 kg/m       Objective:   Physical Exam  Constitutional: He is oriented to person, place, and time. He appears well-developed and well-nourished. No distress.  Cardiovascular: Normal rate, regular rhythm, normal heart sounds and intact distal pulses.  Exam reveals no gallop.   No murmur heard. Pulmonary/Chest: Effort normal and breath sounds normal. No respiratory distress. He has no wheezes. He has no rales. He exhibits no tenderness.  Neurological: He is alert and oriented to person, place, and time.  Skin: Skin is warm and dry. No rash noted. He is not diaphoretic. No erythema. No pallor.  Psychiatric: He has a normal mood and affect. His behavior is normal. Judgment and thought content normal.  Nursing note and vitals reviewed.      Assessment & Plan:  1. Essential hypertension - Well  controlled on current dose. No change in medications  - Monitor at home periodically  - Return precautions given   [name], NP

## 2016-08-25 ENCOUNTER — Ambulatory Visit: Payer: Medicare Other | Admitting: Adult Health

## 2016-10-26 ENCOUNTER — Ambulatory Visit (INDEPENDENT_AMBULATORY_CARE_PROVIDER_SITE_OTHER): Payer: PPO | Admitting: Adult Health

## 2016-10-26 ENCOUNTER — Encounter: Payer: Self-pay | Admitting: Adult Health

## 2016-10-26 VITALS — BP 140/78 | Temp 98.7°F | Ht 70.5 in | Wt 241.0 lb

## 2016-10-26 DIAGNOSIS — J069 Acute upper respiratory infection, unspecified: Secondary | ICD-10-CM

## 2016-10-26 MED ORDER — PREDNISONE 10 MG PO TABS: ORAL_TABLET | ORAL | 0 refills | Status: DC

## 2016-10-26 MED ORDER — HYDROCODONE-HOMATROPINE 5-1.5 MG/5ML PO SYRP: 5.0000 mL | ORAL_SOLUTION | Freq: Three times a day (TID) | ORAL | 0 refills | Status: DC | PRN

## 2016-10-26 NOTE — Progress Notes (Signed)
Subjective:    Patient ID: Marcus Gomez., male    DOB: April 08, 1945, 71 y.o.   MRN: [id number]  URI   This is a new problem. The current episode started in the past 7 days. The problem has been gradually worsening. There has been no fever. Associated symptoms include congestion, coughing (semi productive ), rhinorrhea and a sore throat. Pertinent negatives include no headaches, sinus pain or wheezing. He has tried decongestant for the symptoms. The treatment provided no relief.     Review of Systems  Constitutional: Positive for activity change and fatigue. Negative for chills, diaphoresis and fever.  HENT: Positive for congestion, rhinorrhea, sinus pressure and sore throat. Negative for sinus pain.   Respiratory: Positive for cough (semi productive ) and chest tightness. Negative for shortness of breath and wheezing.   Cardiovascular: Negative.   Neurological: Negative for headaches.   Past Medical History:  Diagnosis Date  . Essential hypertension   . Prostate cancer ([place])    Remission    Social History   Social History  . Marital status: Divorced    Spouse name: N/A  . Number of children: N/A  . Years of education: N/A   Occupational History  . Not on file.   Social History Main Topics  . Smoking status: Never Smoker  . Smokeless tobacco: Never Used  . Alcohol use Yes     Comment: socially   . Drug use: No  . Sexual activity: Yes   Other Topics Concern  . 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           Past Surgical History:  Procedure Laterality Date  . KNEE SURGERY Bilateral 2015 & 2014  . SHOULDER SURGERY Right 2016    Family History  Problem Relation Age of Onset  . Stroke Mother   . Allergies Father   . COPD Father        never smoker  . Rheum arthritis Father     Allergies  Allergen Reactions  . Lisinopril Cough    Current Outpatient  Prescriptions on File Prior to Visit  Medication Sig Dispense Refill  . losartan (COZAAR) 50 MG tablet Take 1 tablet (50 mg total) by mouth daily. 90 tablet 3   No current facility-administered medications on file prior to visit.     BP 140/78 (BP Location: Left Arm)   Temp 98.7 F (37.1 C) (Oral)   Ht 5' 10.5" (1.791 m)   Wt 241 lb (109.3 kg)   BMI 34.09 kg/m      Objective:   Physical Exam  Constitutional: He appears well-developed and well-nourished.  HENT:  Head: Normocephalic and atraumatic.  Right Ear: Hearing, tympanic membrane, external ear and ear canal normal.  Left Ear: Hearing, tympanic membrane and external ear normal.  Nose: Nose normal. Right sinus exhibits no maxillary sinus tenderness and no frontal sinus tenderness. Left sinus exhibits no maxillary sinus tenderness and no frontal sinus tenderness.  Mouth/Throat: Uvula is midline, oropharynx is clear and moist and mucous membranes are normal. No oropharyngeal exudate.  Pulmonary/Chest: He has wheezes (trace throghout ).  Skin: He is not diaphoretic.  Vitals reviewed.     Assessment & Plan:  1. Upper respiratory tract infection, unspecified type - No signs of acute bacterial infection. There is no indication for for abx therapy at this time. Will  treat with prednisone taper  - predniSONE (DELTASONE) 10 MG tablet; 40 mg x 3 days, 20 mg x 3 days, 10 mg x 3 days  Dispense: 21 tablet; Refill: 0 - HYDROcodone-homatropine (HYCODAN) 5-1.5 MG/5ML syrup; Take 5 mLs by mouth every 8 (eight) hours as needed for cough.  Dispense: 120 mL; Refill: 0  [name], NP

## 2016-10-28 ENCOUNTER — Telehealth: Payer: Self-pay | Admitting: Adult Health

## 2016-10-28 MED ORDER — DOXYCYCLINE HYCLATE 100 MG PO TABS: 100.0000 mg | ORAL_TABLET | Freq: Two times a day (BID) | ORAL | 0 refills | Status: AC

## 2016-10-28 NOTE — Telephone Encounter (Signed)
He can have doxy 100 mg BID x 7 days. Needs to follow up if no improvement

## 2016-10-28 NOTE — Telephone Encounter (Signed)
Pt is calling stating that he knows that he has a sinus infection and would like to have something for it.Pharm:  [facility] on [address]

## 2016-10-28 NOTE — Telephone Encounter (Signed)
Pt notified to pick up at the pharmacy. 

## 2016-10-28 NOTE — Telephone Encounter (Signed)
Spoke to the pt.  He complains of yellow, thick and sometimes bloody nasal mucus.  Cough suppressant is helping from office visit on 10/26/16.   Denies fever and any other symptoms.  Please advise.  Per pt, ok to leave message if he is unable to answer phone when I call back.

## 2017-01-09 ENCOUNTER — Encounter: Payer: Self-pay | Admitting: Family Medicine

## 2017-01-09 ENCOUNTER — Ambulatory Visit (INDEPENDENT_AMBULATORY_CARE_PROVIDER_SITE_OTHER): Payer: PPO | Admitting: Family Medicine

## 2017-01-09 VITALS — BP 134/78 | HR 79 | Temp 99.1°F | Ht 70.5 in | Wt 248.8 lb

## 2017-01-09 DIAGNOSIS — R059 Cough, unspecified: Secondary | ICD-10-CM

## 2017-01-09 DIAGNOSIS — R05 Cough: Secondary | ICD-10-CM | POA: Diagnosis not present

## 2017-01-09 MED ORDER — AZITHROMYCIN 250 MG PO TABS: ORAL_TABLET | ORAL | 0 refills | Status: DC

## 2017-01-09 MED ORDER — IPRATROPIUM BROMIDE 0.06 % NA SOLN
2.0000 | Freq: Four times a day (QID) | NASAL | 0 refills | Status: DC
Start: 2017-01-09 — End: 2017-03-13

## 2017-01-09 MED ORDER — METHYLPREDNISOLONE ACETATE 80 MG/ML IJ SUSP
80.0000 mg | Freq: Once | INTRAMUSCULAR | Status: AC
  Administered 2017-01-09: 80 mg via INTRAMUSCULAR

## 2017-01-09 NOTE — Progress Notes (Signed)
    Subjective:  Marcus Gomez. is a 71 y.o. male who presents today with a chief complaint of cough.   HPI:  Cough, acute issue Started 2 days ago. Gotten worse over the last couple of days. Associated with sore throat, rhinorrhea, cough, and myalgias. Tried some OTC cough/cold meds which have not helped. Daughter has had similar symptoms. No fevers or chills. No shortness of breath.  No other obvious alleviating or aggravating factors.  ROS: Per HPI  PMH: Smoking history reviewed. Never smoker.   Objective:  Physical Exam: Ht 5' 10.5" (1.791 m)   Wt 248 lb 12.8 oz (112.9 kg)   BMI 35.19 kg/m   Gen: NAD, resting comfortably HEENT: TMs with clear effusion bilaterally.  Maxillary sinuses with mildly decreased transillumination bilaterally.  No lymphadenopathy.  Oropharynx erythematous without exudate. CV: RRR with no murmurs appreciated Pulm: NWOB, CTAB with no crackles, wheezes, or rhonchi  Assessment/Plan:  Cough No signs of bacterial infection today.  Start Atrovent nasal spray.  Also give 80 mg IM Depo-Medrol for his sore throat and body aches.  Advised him to continue using Tylenol and/or Motrin as needed for low-grade fever and pain.  Sent in a "pocket prescription" for azithromycin with strict instruction to not start unless symptoms worsen or fail to improve over the next few days.  Encouraged good oral hydration.  Return precautions reviewed.  Follow-up as needed.  Algis Greenhouse. Jerline Pain, MD 01/09/2017 9:57 AM

## 2017-01-09 NOTE — Addendum Note (Signed)
Addended by: Elmer Bales on: 01/09/2017 10:15 AM   Modules accepted: Orders

## 2017-01-26 ENCOUNTER — Ambulatory Visit (INDEPENDENT_AMBULATORY_CARE_PROVIDER_SITE_OTHER): Payer: PPO | Admitting: Adult Health

## 2017-01-26 ENCOUNTER — Encounter: Payer: Self-pay | Admitting: Adult Health

## 2017-01-26 VITALS — BP 120/70 | HR 68 | Temp 98.4°F | Ht 70.5 in | Wt 238.9 lb

## 2017-01-26 DIAGNOSIS — J0141 Acute recurrent pansinusitis: Secondary | ICD-10-CM

## 2017-01-26 DIAGNOSIS — R059 Cough, unspecified: Secondary | ICD-10-CM

## 2017-01-26 DIAGNOSIS — R05 Cough: Secondary | ICD-10-CM

## 2017-01-26 MED ORDER — BENZONATATE 200 MG PO CAPS: 200.0000 mg | ORAL_CAPSULE | Freq: Two times a day (BID) | ORAL | 0 refills | Status: DC | PRN

## 2017-01-26 MED ORDER — DOXYCYCLINE HYCLATE 100 MG PO CAPS: 100.0000 mg | ORAL_CAPSULE | Freq: Two times a day (BID) | ORAL | 0 refills | Status: DC

## 2017-01-26 MED ORDER — PREDNISONE 10 MG PO TABS
ORAL_TABLET | ORAL | 0 refills | Status: DC
Start: 2017-01-26 — End: 2017-03-13

## 2017-01-26 NOTE — Progress Notes (Signed)
Subjective:    Patient ID: Marcus Gomez., male    DOB: 1945/12/30, 72 y.o.   MRN: [id number]  Was seen by another PCP two weeks ago for cough. Per pcp note, he did not think it was bacterial. Kasandra Knudsen was prescribed  Atrovent nasal spray.  Also give 80 mg IM Depo-Medrol for his sore throat and body aches.  Advised him to continue using Tylenol and/or Motrin as needed for low-grade fever and pain. Prescription for Z-pack was sent in to use if not feeling any better in a week.   Today in the office he reports that since being seen he is not any better. He took the antibiotics and had no relief. He continues to have a productive cough that he feels is becoming worse.    Cough  This is a new problem. The current episode started 1 to 4 weeks ago. The cough is productive of sputum. Associated symptoms include headaches, nasal congestion, postnasal drip and rhinorrhea. Pertinent negatives include no ear pain, fever, sore throat, shortness of breath or wheezing. He has tried nothing for the symptoms.      Review of Systems  Constitutional: Negative.  Negative for fever.  HENT: Positive for postnasal drip, rhinorrhea and sinus pressure. Negative for ear discharge, ear pain, sore throat and trouble swallowing.   Respiratory: Positive for cough. Negative for chest tightness, shortness of breath and wheezing.   Neurological: Positive for headaches.   Past Medical History:  Diagnosis Date  . Essential hypertension   . Prostate cancer ([place])    Remission    Social History   Socioeconomic History  . Marital status: Divorced    Spouse name: Not on file  . Number of children: Not on file  . Years of education: Not on file  . Highest education level: Not on file  Social Needs  . Financial resource strain: Not on file  . Food insecurity - worry: Not on file  . Food insecurity - inability: Not on file  . Transportation needs - medical: Not on file  . Transportation needs -  non-medical: Not on file  Occupational History  . Not on file  Tobacco Use  . Smoking status: Never Smoker  . Smokeless tobacco: Never Used  Substance and Sexual Activity  . Alcohol use: Yes    Comment: socially   . Drug use: No  . Sexual activity: Yes  Other Topics Concern  . 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        Past Surgical History:  Procedure Laterality Date  . KNEE SURGERY Bilateral 2015 & 2014  . SHOULDER SURGERY Right 2016    Family History  Problem Relation Age of Onset  . Stroke Mother   . Allergies Father   . COPD Father        never smoker  . Rheum arthritis Father     Allergies  Allergen Reactions  . Lisinopril Cough    Current Outpatient Medications on File Prior to Visit  Medication Sig Dispense Refill  . ipratropium (ATROVENT) 0.06 % nasal spray Place 2 sprays into both nostrils 4 (four) times daily. 15 mL 0  . losartan (COZAAR) 50 MG tablet Take 1 tablet (50 mg total) by mouth daily. 90 tablet 3   No current facility-administered medications on file prior to visit.     BP 120/70 (  BP Location: Left Arm, Patient Position: Sitting, Cuff Size: Large)   Pulse 68   Temp 98.4 F (36.9 C) (Oral)   Ht 5' 10.5" (1.791 m)   Wt 238 lb 14.4 oz (108.4 kg)   SpO2 97%   BMI 33.79 kg/m       Objective:   Physical Exam  Constitutional: He is oriented to person, place, and time. He appears well-developed and well-nourished. No distress.  HENT:  Head: Normocephalic and atraumatic.  Right Ear: Hearing, tympanic membrane, external ear and ear canal normal.  Left Ear: Hearing, tympanic membrane, external ear and ear canal normal.  Nose: Mucosal edema and rhinorrhea present. Right sinus exhibits maxillary sinus tenderness and frontal sinus tenderness. Left sinus exhibits maxillary sinus tenderness and frontal sinus tenderness.  Mouth/Throat: Uvula is midline,  oropharynx is clear and moist and mucous membranes are normal. No oropharyngeal exudate.  Neck: Neck supple.  Cardiovascular: Normal rate, regular rhythm, normal heart sounds and intact distal pulses. Exam reveals no gallop and no friction rub.  No murmur heard. Pulmonary/Chest: Effort normal and breath sounds normal. No respiratory distress. He has no wheezes. He has no rales. He exhibits no tenderness.  Neurological: He is alert and oriented to person, place, and time.  Skin: Skin is warm and dry. No rash noted. He is not diaphoretic. No erythema. No pallor.  Psychiatric: He has a normal mood and affect. His behavior is normal. Thought content normal.  Nursing note and vitals reviewed.     Assessment & Plan:  1. Acute recurrent pansinusitis - possible abx failure.  - doxycycline (VIBRAMYCIN) 100 MG capsule; Take 1 capsule (100 mg total) by mouth 2 (two) times daily.  Dispense: 14 capsule; Refill: 0 - Follow up if no improvement in the next 2-3 days   2. Cough  - predniSONE (DELTASONE) 10 MG tablet; 40 mg x 3 days, 20 mg x 3 days, 10 mg x 3 days  Dispense: 21 tablet; Refill: 0 - benzonatate (TESSALON) 200 MG capsule; Take 1 capsule (200 mg total) by mouth 2 (two) times daily as needed for cough.  Dispense: 20 capsule; Refill: 0  [name], NP

## 2017-03-13 ENCOUNTER — Encounter: Payer: Self-pay | Admitting: Allergy and Immunology

## 2017-03-13 ENCOUNTER — Ambulatory Visit (INDEPENDENT_AMBULATORY_CARE_PROVIDER_SITE_OTHER): Payer: PPO | Admitting: Allergy and Immunology

## 2017-03-13 VITALS — BP 130/90 | HR 68 | Temp 97.7°F | Resp 18 | Ht 70.5 in | Wt 245.0 lb

## 2017-03-13 DIAGNOSIS — R05 Cough: Secondary | ICD-10-CM

## 2017-03-13 DIAGNOSIS — J3089 Other allergic rhinitis: Secondary | ICD-10-CM | POA: Diagnosis not present

## 2017-03-13 DIAGNOSIS — H1013 Acute atopic conjunctivitis, bilateral: Secondary | ICD-10-CM

## 2017-03-13 DIAGNOSIS — R059 Cough, unspecified: Secondary | ICD-10-CM | POA: Insufficient documentation

## 2017-03-13 DIAGNOSIS — H1010 Acute atopic conjunctivitis, unspecified eye: Secondary | ICD-10-CM | POA: Insufficient documentation

## 2017-03-13 MED ORDER — AZELASTINE HCL 0.1 % NA SOLN: 2.0000 | Freq: Two times a day (BID) | NASAL | 5 refills | Status: DC | PRN

## 2017-03-13 MED ORDER — FEXOFENADINE HCL 180 MG PO TABS: 180.0000 mg | ORAL_TABLET | Freq: Every day | ORAL | 5 refills | Status: DC

## 2017-03-13 MED ORDER — OLOPATADINE HCL 0.2 % OP SOLN
1.0000 [drp] | OPHTHALMIC | 5 refills | Status: DC
Start: 2017-03-13 — End: 2017-07-24

## 2017-03-13 NOTE — Assessment & Plan Note (Signed)
   Treatment plan as outlined above for allergic rhinitis.  A prescription has been provided for Pataday, one drop per eye daily as needed.  I have also recommended eye lubricant drops (i.e., Natural Tears) as needed. 

## 2017-03-13 NOTE — Assessment & Plan Note (Addendum)
   Aeroallergen avoidance measures have been discussed and provided in written form.  A prescription has been provided for azelastine nasal spray, 1-2 sprays per nostril 2 times daily as needed. Proper nasal spray technique has been discussed and demonstrated.   Nasal saline lavage (NeilMed) has been recommended as needed and prior to medicated nasal sprays along with instructions for proper administration.  For itchy nose, runny nose, and sneezing, use fexofenadine (Allegra) 180 mg daily as needed.  For thick post nasal drainage, add guaifenesin 600 mg (Mucinex)  twice daily as needed with adequate hydration as discussed.  If allergen avoidance measures and medications fail to adequately relieve symptoms, aeroallergen immunotherapy will be considered.

## 2017-03-13 NOTE — Assessment & Plan Note (Signed)
The patient's history and physical examination suggest upper airway cough syndrome.  Spirometry today reveals normal ventilatory function. We will aggressively treat postnasal drainage and evaluate results.  Treatment plan as outlined above.  If the coughing persists or progresses despite this plan, we will evaluate further. 

## 2017-03-13 NOTE — Patient Instructions (Addendum)
Perennial and seasonal allergic rhinitis  Aeroallergen avoidance measures have been discussed and provided in written form.  A prescription has been provided for azelastine nasal spray, 1-2 sprays per nostril 2 times daily as needed. Proper nasal spray technique has been discussed and demonstrated.   Nasal saline lavage (NeilMed) has been recommended as needed and prior to medicated nasal sprays along with instructions for proper administration.  For itchy nose, runny nose, and sneezing, use fexofenadine (Allegra) 180 mg daily as needed.  For thick post nasal drainage, add guaifenesin 600 mg (Mucinex)  twice daily as needed with adequate hydration as discussed.  If allergen avoidance measures and medications fail to adequately relieve symptoms, aeroallergen immunotherapy will be considered.  Allergic conjunctivitis  Treatment plan as outlined above for allergic rhinitis.  A prescription has been provided for Pataday, one drop per eye daily as needed.  I have also recommended eye lubricant drops (i.e., Natural Tears) as needed.  Coughing The patient's history and physical examination suggest upper airway cough syndrome.  Spirometry today reveals normal ventilatory function. We will aggressively treat postnasal drainage and evaluate results.  Treatment plan as outlined above.  If the coughing persists or progresses despite this plan, we will evaluate further.   Return in about 4 months (around 07/13/2017), or if symptoms worsen or fail to improve.  Reducing Pollen Exposure  The American Academy of Allergy, Asthma and Immunology suggests the following steps to reduce your exposure to pollen during allergy seasons.    1. Do not hang sheets or clothing out to dry; pollen may collect on these items. 2. Do not mow lawns or spend time around freshly cut grass; mowing stirs up pollen. 3. Keep windows closed at night.  Keep car windows closed while driving. 4. Minimize morning activities  outdoors, a time when pollen counts are usually at their highest. 5. Stay indoors as much as possible when pollen counts or humidity is high and on windy days when pollen tends to remain in the air longer. 6. Use air conditioning when possible.  Many air conditioners have filters that trap the pollen spores. 7. Use a HEPA room air filter to remove pollen form the indoor air you breathe.   Control of House Dust Mite Allergen  House dust mites play a major role in allergic asthma and rhinitis.  They occur in environments with high humidity wherever human skin, the food for dust mites is found. High levels have been detected in dust obtained from mattresses, pillows, carpets, upholstered furniture, bed covers, clothes and soft toys.  The principal allergen of the house dust mite is found in its feces.  A gram of dust may contain 1,000 mites and 250,000 fecal particles.  Mite antigen is easily measured in the air during house cleaning activities.    1. Encase mattresses, including the box spring, and pillow, in an air tight cover.  Seal the zipper end of the encased mattresses with wide adhesive tape. 2. Wash the bedding in water of 130 degrees Farenheit weekly.  Avoid cotton comforters/quilts and flannel bedding: the most ideal bed covering is the dacron comforter. 3. Remove all upholstered furniture from the bedroom. 4. Remove carpets, carpet padding, rugs, and non-washable window drapes from the bedroom.  Wash drapes weekly or use plastic window coverings. 5. Remove all non-washable stuffed toys from the bedroom.  Wash stuffed toys weekly. 6. Have the room cleaned frequently with a vacuum cleaner and a damp dust-mop.  The patient should not be in a  room which is being cleaned and should wait 1 hour after cleaning before going into the room. 7. Close and seal all heating outlets in the bedroom.  Otherwise, the room will become filled with dust-laden air.  An electric heater can be used to heat the  room. Reduce indoor humidity to less than 50%.  Do not use a humidifier.  Control of Dog or Cat Allergen  Avoidance is the best way to manage a dog or cat allergy. If you have a dog or cat and are allergic to dog or cats, consider removing the dog or cat from the home. If you have a dog or cat but don't want to find it a new home, or if your family wants a pet even though someone in the household is allergic, here are some strategies that may help keep symptoms at bay:  1. Keep the pet out of your bedroom and restrict it to only a few rooms. Be advised that keeping the dog or cat in only one room will not limit the allergens to that room. 2. Don't pet, hug or kiss the dog or cat; if you do, wash your hands with soap and water. 3. High-efficiency particulate air (HEPA) cleaners run continuously in a bedroom or living room can reduce allergen levels over time. 4. Place electrostatic material sheet in the air inlet vent in the bedroom. 5. Regular use of a high-efficiency vacuum cleaner or a central vacuum can reduce allergen levels. 6. Giving your dog or cat a bath at least once a week can reduce airborne allergen.  Control of Mold Allergen  Mold and fungi can grow on a variety of surfaces provided certain temperature and moisture conditions exist.  Outdoor molds grow on plants, decaying vegetation and soil.  The major outdoor mold, Alternaria and Cladosporium, are found in very high numbers during hot and dry conditions.  Generally, a late Summer - Fall peak is seen for common outdoor fungal spores.  Rain will temporarily lower outdoor mold spore count, but counts rise rapidly when the rainy period ends.  The most important indoor molds are Aspergillus and Penicillium.  Dark, humid and poorly ventilated basements are ideal sites for mold growth.  The next most common sites of mold growth are the bathroom and the kitchen.  Outdoor [employer] 1. Use air conditioning and keep windows  closed 2. Avoid exposure to decaying vegetation. 3. Avoid leaf raking. 4. Avoid grain handling. 5. Consider wearing a face mask if working in moldy areas.  Indoor Mold Control 1. Maintain humidity below 50%. 2. Clean washable surfaces with 5% bleach solution. 3. Remove sources e.g. Contaminated carpets.  Control of Cockroach Allergen  Cockroach allergen has been identified as an important cause of acute attacks of asthma, especially in urban settings.  There are fifty-five species of cockroach that exist in the [place], however only three, the [place], [place] species produce allergen that can affect patients with Asthma.  Allergens can be obtained from fecal particles, egg casings and secretions from cockroaches.    1. Remove food sources. 2. Reduce access to water. 3. Seal access and entry points. 4. Spray runways with 0.5-1% Diazinon or Chlorpyrifos 5. Blow boric acid power under stoves and refrigerator. 6. Place bait stations (hydramethylnon) at feeding sites.

## 2017-03-13 NOTE — Progress Notes (Signed)
New Patient Note  RE: Marcus Gomez. MRN: [id number] DOB: [birth year] Date of Office Visit: 03/13/2017  Referring provider: [name], NP Primary care provider: [name], NP  Chief Complaint: Nasal Congestion; Sinus Problem; and Cough   History of present illness: Marcus Scarpelli. is a 72 y.o. male seen today in consultation requested by [name], NP.  He complains of nasal congestion, rhinorrhea, sneezing, thick postnasal drainage, coughing, nasal pruritus, ocular pruritus, and sinus pressure over the forehead.  He notes that he had been on aeroallergen immunotherapy approximately 25 years ago with benefit.  However, the aforementioned symptoms began to emerge approximately 6 months ago.  No specific environmental triggers been identified.  He reports that he has had 3 sinus infections over the past 6 months requiring antibiotics and/or corticosteroids.  The cough feels like it originates as irritation from the base of his throat and he believes that it may be secondary to postnasal drainage.   Assessment and plan: Perennial and seasonal allergic rhinitis  Aeroallergen avoidance measures have been discussed and provided in written form.  A prescription has been provided for azelastine nasal spray, 1-2 sprays per nostril 2 times daily as needed. Proper nasal spray technique has been discussed and demonstrated.   Nasal saline lavage (NeilMed) has been recommended as needed and prior to medicated nasal sprays along with instructions for proper administration.  For itchy nose, runny nose, and sneezing, use fexofenadine (Allegra) 180 mg daily as needed.  For thick post nasal drainage, add guaifenesin 600 mg (Mucinex)  twice daily as needed with adequate hydration as discussed.  If allergen avoidance measures and medications fail to adequately relieve symptoms, aeroallergen immunotherapy will be considered.  Allergic conjunctivitis  Treatment  plan as outlined above for allergic rhinitis.  A prescription has been provided for Pataday, one drop per eye daily as needed.  I have also recommended eye lubricant drops (i.e., Natural Tears) as needed.  Coughing The patient's history and physical examination suggest upper airway cough syndrome.  Spirometry today reveals normal ventilatory function. We will aggressively treat postnasal drainage and evaluate results.  Treatment plan as outlined above.  If the coughing persists or progresses despite this plan, we will evaluate further.   Meds ordered this encounter  Medications  . azelastine (ASTELIN) 0.1 % nasal spray    Sig: Place 2 sprays into both nostrils 2 (two) times daily as needed for rhinitis. Use in each nostril as directed    Dispense:  30 mL    Refill:  5  . fexofenadine (ALLEGRA) 180 MG tablet    Sig: Take 1 tablet (180 mg total) by mouth daily.    Dispense:  30 tablet    Refill:  5  . Olopatadine HCl (PATADAY) 0.2 % SOLN    Sig: Place 1 drop into both eyes 1 day or 1 dose.    Dispense:  1 Bottle    Refill:  5    Diagnostics: Spirometry:  Normal with an FEV1 of 97% predicted.  Please see scanned spirometry results for details. Epicutaneous testing: Positive to grass pollen. Intradermal testing: Positive to tree pollen, molds, cat hair, dog epithelia, cockroach antigen, and dust mite antigen.    Physical examination: Blood pressure 130/90, pulse 68, temperature 97.7 F (36.5 C), temperature source Oral, resp. rate 18, height 5' 10.5" (1.791 m), weight 245 lb (111.1 kg), SpO2 96 %.  General: Alert, interactive, in no acute distress. HEENT: TMs pearly gray, turbinates moderately edematous without  discharge, post-pharynx moderately erythematous. Neck: Supple without lymphadenopathy. Lungs: Clear to auscultation without wheezing, rhonchi or rales. CV: Normal S1, S2 without murmurs. Abdomen: Nondistended, nontender. Skin: Warm and dry, without lesions or  rashes. Extremities:  No clubbing, cyanosis or edema. Neuro:   Grossly intact.  Review of systems:  Review of systems negative except as noted in HPI / PMHx or noted below: Review of Systems  Constitutional: Negative.   HENT: Negative.   Eyes: Negative.   Respiratory: Negative.   Cardiovascular: Negative.   Gastrointestinal: Negative.   Genitourinary: Negative.   Musculoskeletal: Negative.   Skin: Negative.   Neurological: Negative.   Endo/Heme/Allergies: Negative.   Psychiatric/Behavioral: Negative.     Past medical history:  Past Medical History:  Diagnosis Date  . Essential hypertension   . Prostate cancer ([place])    Remission    Past surgical history:  Past Surgical History:  Procedure Laterality Date  . KNEE SURGERY Bilateral 2015 & 2014  . SHOULDER SURGERY Right 2016    Family history: Family History  Problem Relation Age of Onset  . Stroke Mother   . Allergies Father   . COPD Father        never smoker  . Rheum arthritis Father   . Allergic rhinitis Neg Hx   . Angioedema Neg Hx   . Asthma Neg Hx   . Eczema Neg Hx   . Immunodeficiency Neg Hx   . Urticaria Neg Hx     Social history: Social History   Socioeconomic History  . Marital status: Divorced    Spouse name: Not on file  . Number of children: Not on file  . Years of education: Not on file  . Highest education level: Not on file  Social Needs  . Financial resource strain: Not on file  . Food insecurity - worry: Not on file  . Food insecurity - inability: Not on file  . Transportation needs - medical: Not on file  . Transportation needs - non-medical: Not on file  Occupational History  . Not on file  Tobacco Use  . Smoking status: Never Smoker  . Smokeless tobacco: Never Used  Substance and Sexual Activity  . Alcohol use: Yes    Comment: socially   . Drug use: No  . Sexual activity: Yes  Other Topics Concern  . 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       Environmental History: The patient lives in a 72 year old house with hardwood floors throughout and central air/heat.  There is a cat and dog in the home which do not have access to his bedroom.  He is a non-smoker.  There is no known mold/water damage in his home.  Allergies as of 03/13/2017      Reactions   Lisinopril Cough      Medication List        Accurate as of 03/13/17 10:33 AM. Always use your most recent med list.          azelastine 0.1 % nasal spray Commonly known as:  ASTELIN Place 2 sprays into both nostrils 2 (two) times daily as needed for rhinitis. Use in each nostril as directed   EYE VITAMINS & MINERALS Tabs Take by mouth.   fexofenadine 180 MG tablet Commonly known as:  ALLEGRA Take 1 tablet (180 mg total) by mouth daily.   losartan  50 MG tablet Commonly known as:  COZAAR Take 1 tablet (50 mg total) by mouth daily.   Olopatadine HCl 0.2 % Soln Commonly known as:  PATADAY Place 1 drop into both eyes 1 day or 1 dose.       Known medication allergies: Allergies  Allergen Reactions  . Lisinopril Cough    I appreciate the opportunity to take part in Marcus Gomez's care. Please do not hesitate to contact me with questions.  Sincerely,   R. Edgar Frisk, MD

## 2017-04-19 ENCOUNTER — Ambulatory Visit (INDEPENDENT_AMBULATORY_CARE_PROVIDER_SITE_OTHER): Payer: PPO | Admitting: Ophthalmology

## 2017-04-19 ENCOUNTER — Encounter (INDEPENDENT_AMBULATORY_CARE_PROVIDER_SITE_OTHER): Payer: PPO | Admitting: Ophthalmology

## 2017-04-19 DIAGNOSIS — H353132 Nonexudative age-related macular degeneration, bilateral, intermediate dry stage: Secondary | ICD-10-CM

## 2017-04-19 DIAGNOSIS — H43813 Vitreous degeneration, bilateral: Secondary | ICD-10-CM | POA: Diagnosis not present

## 2017-04-19 DIAGNOSIS — H35371 Puckering of macula, right eye: Secondary | ICD-10-CM | POA: Diagnosis not present

## 2017-04-19 DIAGNOSIS — H2513 Age-related nuclear cataract, bilateral: Secondary | ICD-10-CM

## 2017-04-19 DIAGNOSIS — H35033 Hypertensive retinopathy, bilateral: Secondary | ICD-10-CM

## 2017-04-19 DIAGNOSIS — I10 Essential (primary) hypertension: Secondary | ICD-10-CM | POA: Diagnosis not present

## 2017-07-11 ENCOUNTER — Ambulatory Visit: Payer: PPO | Admitting: Allergy and Immunology

## 2017-07-24 ENCOUNTER — Ambulatory Visit (INDEPENDENT_AMBULATORY_CARE_PROVIDER_SITE_OTHER): Payer: PPO | Admitting: Allergy and Immunology

## 2017-07-24 ENCOUNTER — Encounter: Payer: Self-pay | Admitting: Allergy and Immunology

## 2017-07-24 DIAGNOSIS — H1013 Acute atopic conjunctivitis, bilateral: Secondary | ICD-10-CM

## 2017-07-24 DIAGNOSIS — K219 Gastro-esophageal reflux disease without esophagitis: Secondary | ICD-10-CM | POA: Insufficient documentation

## 2017-07-24 DIAGNOSIS — J3089 Other allergic rhinitis: Secondary | ICD-10-CM | POA: Diagnosis not present

## 2017-07-24 MED ORDER — OLOPATADINE HCL 0.2 % OP SOLN: 1.0000 [drp] | OPHTHALMIC | 5 refills | Status: DC

## 2017-07-24 MED ORDER — FEXOFENADINE HCL 180 MG PO TABS: 180.0000 mg | ORAL_TABLET | Freq: Every day | ORAL | 5 refills | Status: DC

## 2017-07-24 MED ORDER — AZELASTINE HCL 0.1 % NA SOLN: 2.0000 | Freq: Two times a day (BID) | NASAL | 5 refills | Status: DC | PRN

## 2017-07-24 NOTE — Assessment & Plan Note (Signed)
   Continue appropriate allergen avoidance measures and olopatadine eyedrops daily as needed.

## 2017-07-24 NOTE — Patient Instructions (Addendum)
Perennial and seasonal allergic rhinitis Improved and well controlled.  Continue appropriate allergen avoidance measures, fexofenadine, azelastine nasal spray, and nasal saline irrigation.  As his symptoms are well controlled, he may consider using these medications on an as needed, rather than daily scheduled regimen.  If allergen avoidance measures and medications fail to adequately relieve symptoms, or if he would like to decrease medication requirement, aeroallergen immunotherapy will be considered.  Allergic conjunctivitis  Continue appropriate allergen avoidance measures and olopatadine eyedrops daily as needed.  GERD (gastroesophageal reflux disease)  Appropriate reflux lifestyle modifications have been provided.  I have also recommended ranitidine 150 mg as needed and prior to meals which may have spicy foods.   Return in about 1 year (around 07/25/2018), or if symptoms worsen or fail to improve.   Lifestyle Changes for Controlling GERD  When you have GERD, stomach acid feels as if it's backing up toward your mouth. Whether or not you take medication to control your GERD, your symptoms can often be improved with lifestyle changes.   Raise Your Head  Reflux is more likely to strike when you're lying down flat, because stomach fluid can  flow backward more easily. Raising the head of your bed 4-6 inches can help. To do this:  Slide blocks or books under the legs at the head of your bed. Or, place a wedge under  the mattress. Many foam stores can make a suitable wedge for you. The wedge  should run from your waist to the top of your head.  Don't just prop your head on several pillows. This increases pressure on your  stomach. It can make GERD worse.  Watch Your Eating Habits Certain foods may increase the acid in your stomach or relax the lower esophageal sphincter, making GERD more likely. It's best to avoid the following:  Coffee, tea, and carbonated drinks  (with and without caffeine)  Fatty, fried, or spicy food  Mint, chocolate, onions, and tomatoes  Any other foods that seem to irritate your stomach or cause you pain  Relieve the Pressure  Eat smaller meals, even if you have to eat more often.  Don't lie down right after you eat. Wait a few hours for your stomach to empty.  Avoid tight belts and tight-fitting clothes.  Lose excess weight.  Tobacco and Alcohol  Avoid smoking tobacco and drinking alcohol. They can make GERD symptoms worse.

## 2017-07-24 NOTE — Assessment & Plan Note (Signed)
Improved and well controlled.  Continue appropriate allergen avoidance measures, fexofenadine, azelastine nasal spray, and nasal saline irrigation.  As his symptoms are well controlled, he may consider using these medications on an as needed, rather than daily scheduled regimen.  If allergen avoidance measures and medications fail to adequately relieve symptoms, or if he would like to decrease medication requirement, aeroallergen immunotherapy will be considered.

## 2017-07-24 NOTE — Assessment & Plan Note (Signed)
   Appropriate reflux lifestyle modifications have been provided.  I have also recommended ranitidine 150 mg as needed and prior to meals which may have spicy foods.

## 2017-07-24 NOTE — Progress Notes (Signed)
Follow-up Note  RE: Marcus Gomez. MRN: [id number] DOB: 09/27/1945 Date of Office Visit: 07/24/2017  Primary care provider: [name], NP Referring provider: [name], NP  History of present illness: Marcus Gomez" Marcus Gomez. is a 72 y.o. male with allergic rhinoconjunctivitis and history of persistent cough presenting today for follow-up.  He was previously seen in this clinic for his initial evaluation on March 13, 2017.  He reports that his nasal and ocular symptoms are well controlled with medications prescribed during his initial visit.  His only complaint today is that approximately 1 week ago he consumed spicy salad dressing and had an episode of heartburn.  He states that he rarely experiences heartburn and, if he does, it is typically mild and associated with the consumption of spicy foods.  Assessment and plan: Perennial and seasonal allergic rhinitis Improved and well controlled.  Continue appropriate allergen avoidance measures, fexofenadine, azelastine nasal spray, and nasal saline irrigation.  As his symptoms are well controlled, he may consider using these medications on an as needed, rather than daily scheduled regimen.  If allergen avoidance measures and medications fail to adequately relieve symptoms, or if he would like to decrease medication requirement, aeroallergen immunotherapy will be considered.  Allergic conjunctivitis  Continue appropriate allergen avoidance measures and olopatadine eyedrops daily as needed.  GERD (gastroesophageal reflux disease)  Appropriate reflux lifestyle modifications have been provided.  I have also recommended ranitidine 150 mg as needed and prior to meals which may have spicy foods.   Meds ordered this encounter  Medications  . azelastine (ASTELIN) 0.1 % nasal spray    Sig: Place 2 sprays into both nostrils 2 (two) times daily as needed for rhinitis. Use in each nostril as directed    Dispense:   30 mL    Refill:  5  . fexofenadine (ALLEGRA) 180 MG tablet    Sig: Take 1 tablet (180 mg total) by mouth daily.    Dispense:  30 tablet    Refill:  5  . Olopatadine HCl (PATADAY) 0.2 % SOLN    Sig: Place 1 drop into both eyes 1 day or 1 dose.    Dispense:  1 Bottle    Refill:  5    Physical examination: Blood pressure 124/72, pulse 74, temperature 98.7 F (37.1 C), temperature source Oral, resp. rate 18, SpO2 98 %.  General: Alert, interactive, in no acute distress. HEENT: TMs pearly gray, turbinates minimally edematous without discharge, post-pharynx mildly erythematous. Neck: Supple without lymphadenopathy. Lungs: Clear to auscultation without wheezing, rhonchi or rales. CV: Normal S1, S2 without murmurs. Skin: Warm and dry, without lesions or rashes.  The following portions of the patient's history were reviewed and updated as appropriate: allergies, current medications, past family history, past medical history, past social history, past surgical history and problem list.  Allergies as of 07/24/2017      Reactions   Lisinopril Cough      Medication List        Accurate as of 07/24/17 12:55 PM. Always use your most recent med list.          azelastine 0.1 % nasal spray Commonly known as:  ASTELIN Place 2 sprays into both nostrils 2 (two) times daily as needed for rhinitis. Use in each nostril as directed   fexofenadine 180 MG tablet Commonly known as:  ALLEGRA Take 1 tablet (180 mg total) by mouth daily.   ICAPS AREDS 2 Caps Take by mouth.   losartan 50  MG tablet Commonly known as:  COZAAR Take 1 tablet (50 mg total) by mouth daily.   Olopatadine HCl 0.2 % Soln Commonly known as:  PATADAY Place 1 drop into both eyes 1 day or 1 dose.       Allergies  Allergen Reactions  . Lisinopril Cough    I appreciate the opportunity to take part in Marcus Gomez's care. Please do not hesitate to contact me with questions.  Sincerely,   R. Edgar Frisk, MD

## 2017-10-18 ENCOUNTER — Other Ambulatory Visit: Payer: Self-pay | Admitting: Adult Health

## 2017-10-18 DIAGNOSIS — I10 Essential (primary) hypertension: Secondary | ICD-10-CM

## 2017-10-20 ENCOUNTER — Ambulatory Visit (INDEPENDENT_AMBULATORY_CARE_PROVIDER_SITE_OTHER): Payer: PPO | Admitting: Adult Health

## 2017-10-20 ENCOUNTER — Encounter: Payer: Self-pay | Admitting: Adult Health

## 2017-10-20 VITALS — BP 138/64 | HR 71 | Temp 98.8°F | Wt 244.8 lb

## 2017-10-20 DIAGNOSIS — Z760 Encounter for issue of repeat prescription: Secondary | ICD-10-CM | POA: Diagnosis not present

## 2017-10-20 DIAGNOSIS — W57XXXA Bitten or stung by nonvenomous insect and other nonvenomous arthropods, initial encounter: Secondary | ICD-10-CM | POA: Diagnosis not present

## 2017-10-20 DIAGNOSIS — S60562A Insect bite (nonvenomous) of left hand, initial encounter: Secondary | ICD-10-CM | POA: Diagnosis not present

## 2017-10-20 MED ORDER — DOXYCYCLINE HYCLATE 100 MG PO CAPS: 100.0000 mg | ORAL_CAPSULE | Freq: Two times a day (BID) | ORAL | 0 refills | Status: DC

## 2017-10-20 MED ORDER — LOSARTAN POTASSIUM 50 MG PO TABS: 50.0000 mg | ORAL_TABLET | Freq: Every day | ORAL | 0 refills | Status: DC

## 2017-10-20 NOTE — Patient Instructions (Signed)
I have prescribed you Doxycycline - this is an antibiotic that is great for skin infections   Please follow up with me on Tuesday or Wednesday next week

## 2017-10-20 NOTE — Progress Notes (Signed)
Subjective:    Patient ID: [name], male    DOB: April 16, 1945, 72 y.o.   MRN: [id number]  HPI 72 year old male who  has a past medical history of Essential hypertension and Prostate cancer ([place]).  He presents to the office today today for possible spider bite to left hand. Reports he was cleaning out some brush in the woods about three weeks ago and " got bit by something". Over the course of the three weeks the wound has become larger and more painful. He denies purulent discharge but will bleed if he accidentally hit wound against something.  He has been applying neosporin    Review of Systems See HPI   Past Medical History:  Diagnosis Date  . Essential hypertension   . Prostate cancer ([place])    Remission    Social History   Socioeconomic History  . Marital status: Divorced    Spouse name: Not on file  . Number of children: Not on file  . Years of education: Not on file  . Highest education level: Not on file  Occupational History  . Not on file  Social Needs  . Financial resource strain: Not on file  . Food insecurity:    Worry: Not on file    Inability: Not on file  . Transportation needs:    Medical: Not on file    Non-medical: Not on file  Tobacco Use  . Smoking status: Never Smoker  . Smokeless tobacco: Never Used  Substance and Sexual Activity  . Alcohol use: Yes    Comment: socially   . Drug use: No  . Sexual activity: Yes  Lifestyle  . Physical activity:    Days per week: Not on file    Minutes per session: Not on file  . Stress: Not on file  Relationships  . Social connections:    Talks on phone: Not on file    Gets together: Not on file    Attends religious service: Not on file    Active member of club or organization: Not on file    Attends meetings of clubs or organizations: Not on file    Relationship status: Not on file  . Intimate partner violence:    Fear of current or ex partner: Not on file    Emotionally abused:  Not on file    Physically abused: Not on file    Forced sexual activity: Not on file  Other Topics Concern  . 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        Past Surgical History:  Procedure Laterality Date  . KNEE SURGERY Bilateral 2015 & 2014  . SHOULDER SURGERY Right 2016    Family History  Problem Relation Age of Onset  . Stroke Mother   . Allergies Father   . COPD Father        never smoker  . Rheum arthritis Father   . Allergic rhinitis Neg Hx   . Angioedema Neg Hx   . Asthma Neg Hx   . Eczema Neg Hx   . Immunodeficiency Neg Hx   . Urticaria Neg Hx     Allergies  Allergen Reactions  . Lisinopril Cough    Current Outpatient Medications on File Prior to Visit  Medication Sig Dispense Refill  . azelastine (ASTELIN) 0.1 % nasal spray Place 2 sprays  into both nostrils 2 (two) times daily as needed for rhinitis. Use in each nostril as directed 30 mL 5  . fexofenadine (ALLEGRA) 180 MG tablet Take 1 tablet (180 mg total) by mouth daily. 30 tablet 5  . Multiple Vitamins-Minerals (ICAPS AREDS 2) CAPS Take by mouth.    . Olopatadine HCl (PATADAY) 0.2 % SOLN Place 1 drop into both eyes 1 day or 1 dose. 1 Bottle 5   No current facility-administered medications on file prior to visit.     BP 138/64 (BP Location: Left Arm, Patient Position: Sitting, Cuff Size: Normal)   Pulse 71   Temp 98.8 F (37.1 C) (Oral)   Wt 244 lb 12.8 oz (111 kg)   SpO2 96%   BMI 34.63 kg/m       Objective:   Physical Exam  Constitutional: He is oriented to person, place, and time. He appears well-developed and well-nourished. No distress.  Neurological: He is alert and oriented to person, place, and time.  Skin: Skin is warm and dry. He is not diaphoretic. There is erythema.  Dime sized wound with raised edges on dorsal aspect of left hand, closest to thumb.No necrotic tissue noted. No drainage or bleeding     Psychiatric: He has a normal mood and affect. His behavior is normal. Judgment and thought content normal.  Nursing note and vitals reviewed.     Assessment & Plan:  1. Insect bite of left hand, initial encounter - keep wound clean and dry  - Follow up for wound check early next week  - doxycycline (VIBRAMYCIN) 100 MG capsule; Take 1 capsule (100 mg total) by mouth 2 (two) times daily.  Dispense: 20 capsule; Refill: 0  2. Medication refill  - losartan (COZAAR) 50 MG tablet; Take 1 tablet (50 mg total) by mouth daily.  Dispense: 90 tablet; Refill: 0   [name], NP

## 2017-10-20 NOTE — Telephone Encounter (Signed)
Filled today by other means

## 2017-10-24 ENCOUNTER — Encounter: Payer: Self-pay | Admitting: Adult Health

## 2017-10-24 ENCOUNTER — Ambulatory Visit (INDEPENDENT_AMBULATORY_CARE_PROVIDER_SITE_OTHER): Payer: PPO | Admitting: Adult Health

## 2017-10-24 VITALS — BP 140/68 | HR 81 | Temp 97.8°F | Wt 245.9 lb

## 2017-10-24 DIAGNOSIS — S60562D Insect bite (nonvenomous) of left hand, subsequent encounter: Secondary | ICD-10-CM

## 2017-10-24 DIAGNOSIS — W57XXXD Bitten or stung by nonvenomous insect and other nonvenomous arthropods, subsequent encounter: Secondary | ICD-10-CM

## 2017-10-24 MED ORDER — CEPHALEXIN 500 MG PO CAPS: 500.0000 mg | ORAL_CAPSULE | Freq: Two times a day (BID) | ORAL | 0 refills | Status: AC

## 2017-10-24 MED ORDER — DOXYCYCLINE HYCLATE 100 MG PO CAPS: 100.0000 mg | ORAL_CAPSULE | Freq: Two times a day (BID) | ORAL | 0 refills | Status: DC

## 2017-10-24 NOTE — Progress Notes (Addendum)
Subjective:    Patient ID: Marcus Gomez., male    DOB: 04/11/1945, 72 y.o.   MRN: [id number]  HPI  72 year old male who  has a past medical history of Essential hypertension and Prostate cancer ([place]). He presents to the office today for follow up regarding wound on his left hand.  Saw him approximately 1 week ago for possible spider bite on the left hand.  At that time he reported that he was cleaning out some brush the woods about 3 weeks ago" got bit by something".  He was prescribed 10-day course of doxycycline and advised to follow-up.  Today in the office he reports his been taking his doxycycline as prescribed, the wound did appear to be improving but over the last 24 hours the wound has started to look more infected.  He denies any warmth or drainage from the wound.    Review of Systems See HPI   Past Medical History:  Diagnosis Date  . Essential hypertension   . Prostate cancer ([place])    Remission    Social History   Socioeconomic History  . Marital status: Divorced    Spouse name: Not on file  . Number of children: Not on file  . Years of education: Not on file  . Highest education level: Not on file  Occupational History  . Not on file  Social Needs  . Financial resource strain: Not on file  . Food insecurity:    Worry: Not on file    Inability: Not on file  . Transportation needs:    Medical: Not on file    Non-medical: Not on file  Tobacco Use  . Smoking status: Never Smoker  . Smokeless tobacco: Never Used  Substance and Sexual Activity  . Alcohol use: Yes    Comment: socially   . Drug use: No  . Sexual activity: Yes  Lifestyle  . Physical activity:    Days per week: Not on file    Minutes per session: Not on file  . Stress: Not on file  Relationships  . Social connections:    Talks on phone: Not on file    Gets together: Not on file    Attends religious service: Not on file    Active member of club or organization: Not on file   Attends meetings of clubs or organizations: Not on file    Relationship status: Not on file  . Intimate partner violence:    Fear of current or ex partner: Not on file    Emotionally abused: Not on file    Physically abused: Not on file    Forced sexual activity: Not on file  Other Topics Concern  . 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        Past Surgical History:  Procedure Laterality Date  . KNEE SURGERY Bilateral 2015 & 2014  . SHOULDER SURGERY Right 2016    Family History  Problem Relation Age of Onset  . Stroke Mother   . Allergies Father   . COPD Father        never smoker  . Rheum arthritis Father   . Allergic rhinitis Neg Hx   . Angioedema Neg Hx   . Asthma Neg Hx   . Eczema Neg Hx   . Immunodeficiency Neg Hx   . Urticaria Neg Hx  Allergies  Allergen Reactions  . Lisinopril Cough    Current Outpatient Medications on File Prior to Visit  Medication Sig Dispense Refill  . azelastine (ASTELIN) 0.1 % nasal spray Place 2 sprays into both nostrils 2 (two) times daily as needed for rhinitis. Use in each nostril as directed 30 mL 5  . doxycycline (VIBRAMYCIN) 100 MG capsule Take 1 capsule (100 mg total) by mouth 2 (two) times daily. 20 capsule 0  . fexofenadine (ALLEGRA) 180 MG tablet Take 1 tablet (180 mg total) by mouth daily. 30 tablet 5  . losartan (COZAAR) 50 MG tablet Take 1 tablet (50 mg total) by mouth daily. 90 tablet 0  . Multiple Vitamins-Minerals (ICAPS AREDS 2) CAPS Take by mouth.    . Olopatadine HCl (PATADAY) 0.2 % SOLN Place 1 drop into both eyes 1 day or 1 dose. 1 Bottle 5   No current facility-administered medications on file prior to visit.     BP 140/68 (BP Location: Right Arm, Patient Position: Sitting, Cuff Size: Large)   Pulse 81   Temp 97.8 F (36.6 C) (Oral)   Wt 245 lb 14.4 oz (111.5 kg)   SpO2 97%   BMI 34.78 kg/m       Objective:    Physical Exam  Constitutional: He is oriented to person, place, and time. He appears well-developed and well-nourished. No distress.  Neurological: He is alert and oriented to person, place, and time.  Skin: Skin is warm and dry. He is not diaphoretic. There is erythema.  Dime sized wound with raised edges on dorsal aspect of left hand, closest to thumb.  Small amount of eschar tissue noted  Nursing note and vitals reviewed.     Assessment & Plan:  1. Insect bite of left hand, subsequent encounter -Area was cleaned with Betadine and alcohol.  Injected approximately 2.5 mL's of a cane without epinephrine around the wound.  Using a 15 blade necrotic tissue was debrided.  Patient tolerated procedure well.  Will place back on another 10 days of doxycycline and will add Keflex.  We will have him follow-up in 10 days for reevaluation - doxycycline (VIBRAMYCIN) 100 MG capsule; Take 1 capsule (100 mg total) by mouth 2 (two) times daily.  Dispense: 20 capsule; Refill: 0 - cephALEXin (KEFLEX) 500 MG capsule; Take 1 capsule (500 mg total) by mouth 2 (two) times daily for 10 days.  Dispense: 20 capsule; Refill: 0  [name], NP

## 2017-11-03 ENCOUNTER — Encounter: Payer: Self-pay | Admitting: Adult Health

## 2017-11-03 ENCOUNTER — Ambulatory Visit (INDEPENDENT_AMBULATORY_CARE_PROVIDER_SITE_OTHER): Payer: PPO | Admitting: Adult Health

## 2017-11-03 VITALS — BP 128/60 | Temp 98.8°F | Wt 245.0 lb

## 2017-11-03 DIAGNOSIS — D499 Neoplasm of unspecified behavior of unspecified site: Secondary | ICD-10-CM

## 2017-11-03 DIAGNOSIS — Z23 Encounter for immunization: Secondary | ICD-10-CM | POA: Diagnosis not present

## 2017-11-03 NOTE — Progress Notes (Signed)
Subjective:    Patient ID: [name], male    DOB: July 25, 1945, 72 y.o.   MRN: [id number]  HPI  72 year old male who  has a past medical history of Essential hypertension and Prostate cancer ([place]).  He presents to the office today for follow up regarding wound of left hand. Originally thought to be a spider bite but not healing. Concern now for [facility]  Review of Systems See HPI   Past Medical History:  Diagnosis Date  . Essential hypertension   . Prostate cancer ([place]    Remission    Social History   Socioeconomic History  . Marital status: Divorced    Spouse name: Not on file  . Number of children: Not on file  . Years of education: Not on file  . Highest education level: Not on file  Occupational History  . Not on file  Social Needs  . Financial resource strain: Not on file  . Food insecurity:    Worry: Not on file    Inability: Not on file  . Transportation needs:    Medical: Not on file    Non-medical: Not on file  Tobacco Use  . Smoking status: Never Smoker  . Smokeless tobacco: Never Used  Substance and Sexual Activity  . Alcohol use: Yes    Comment: socially   . Drug use: No  . Sexual activity: Yes  Lifestyle  . Physical activity:    Days per week: Not on file    Minutes per session: Not on file  . Stress: Not on file  Relationships  . Social connections:    Talks on phone: Not on file    Gets together: Not on file    Attends religious service: Not on file    Active member of club or organization: Not on file    Attends meetings of clubs or organizations: Not on file    Relationship status: Not on file  . Intimate partner violence:    Fear of current or ex partner: Not on file    Emotionally abused: Not on file    Physically abused: Not on file    Forced sexual activity: Not on file  Other Topics Concern  . Not on file  Social History Narrative   Retired - Biomedical scientist    Divorced x 4    Two children - 2 girls both live  Radiation protection practitioner.        Past Surgical History:  Procedure Laterality Date  . KNEE SURGERY Bilateral 2015 & 2014  . SHOULDER SURGERY Right 2016    Family History  Problem Relation Age of Onset  . Stroke Mother   . Allergies Father   . COPD Father        never smoker  . Rheum arthritis Father   . Allergic rhinitis Neg Hx   . Angioedema Neg Hx   . Asthma Neg Hx   . Eczema Neg Hx   . Immunodeficiency Neg Hx   . Urticaria Neg Hx     Allergies  Allergen Reactions  . Lisinopril Cough    Current Outpatient Medications on File Prior to Visit  Medication Sig Dispense Refill  . azelastine (ASTELIN) 0.1 % nasal spray Place 2 sprays into both nostrils 2 (two) times daily as needed for rhinitis. Use in each nostril as directed 30 mL 5  . cephALEXin (KEFLEX) 500 MG capsule Take 1 capsule (500 mg total) by mouth 2 (two) times daily for 10 days.  20 capsule 0  . doxycycline (VIBRAMYCIN) 100 MG capsule Take 1 capsule (100 mg total) by mouth 2 (two) times daily. 20 capsule 0  . fexofenadine (ALLEGRA) 180 MG tablet Take 1 tablet (180 mg total) by mouth daily. 30 tablet 5  . losartan (COZAAR) 50 MG tablet Take 1 tablet (50 mg total) by mouth daily. 90 tablet 0  . Multiple Vitamins-Minerals (ICAPS AREDS 2) CAPS Take by mouth.    . Olopatadine HCl (PATADAY) 0.2 % SOLN Place 1 drop into both eyes 1 day or 1 dose. 1 Bottle 5   No current facility-administered medications on file prior to visit.     BP 128/60   Temp 98.8 F (37.1 C) (Oral)   Wt 245 lb (111.1 kg)   BMI 34.66 kg/m       Objective:   Physical Exam  Constitutional: He is oriented to person, place, and time. He appears well-developed and well-nourished. No distress.  Neurological: He is alert and oriented to person, place, and time.  Skin: Skin is warm and dry. He is not diaphoretic.  Non healing wound on dorsal aspect of left hand   Psychiatric: He has a normal mood and affect. His behavior is normal. Judgment and thought content  normal.  Nursing note and vitals reviewed.     Assessment & Plan:  1. Neoplasm - Will refer to skin surgery center for concern of SCC  - Ambulatory referral to Dermatology  2. Need for influenza vaccination  - Flu vaccine HIGH DOSE PF (Fluzone High dose)   [name], NP

## 2017-11-14 DIAGNOSIS — L988 Other specified disorders of the skin and subcutaneous tissue: Secondary | ICD-10-CM | POA: Diagnosis not present

## 2017-11-14 DIAGNOSIS — D485 Neoplasm of uncertain behavior of skin: Secondary | ICD-10-CM | POA: Diagnosis not present

## 2017-11-16 DIAGNOSIS — L858 Other specified epidermal thickening: Secondary | ICD-10-CM | POA: Diagnosis not present

## 2017-11-21 DIAGNOSIS — L858 Other specified epidermal thickening: Secondary | ICD-10-CM | POA: Diagnosis not present

## 2018-01-15 ENCOUNTER — Other Ambulatory Visit: Payer: Self-pay | Admitting: Adult Health

## 2018-01-15 DIAGNOSIS — Z760 Encounter for issue of repeat prescription: Secondary | ICD-10-CM

## 2018-01-15 NOTE — Telephone Encounter (Signed)
Copied from [place] [id number]. Topic: Quick Communication - Rx Refill/Question >> Jan 15, 2018 11:50 AM Virl Axe D wrote: Medication: losartan (COZAAR) 50 MG tablet  Has the patient contacted their pharmacy? Yes.   (Agent: If no, request that the patient contact the pharmacy for the refill.) (Agent: If yes, when and what did the pharmacy advise?)  Preferred Pharmacy (with phone number or street name): [facility] ([address]), [place] [phone] (Phone) [phone] (Fax)  Agent: Please be advised that RX refills may take up to 3 business days. We ask that you follow-up with your pharmacy.

## 2018-01-16 NOTE — Telephone Encounter (Signed)
Requested medication (s) are due for refill today: yes  Requested medication (s) are on the active medication list: yes  Last refill:  10/20/17 for 90 tabs  Future visit scheduled: yes  Notes to clinic:  Angiotensin receptor blockers failed.  Requested Prescriptions  Pending Prescriptions Disp Refills   losartan (COZAAR) 50 MG tablet 90 tablet 0    Sig: Take 1 tablet (50 mg total) by mouth daily.     Cardiovascular:  Angiotensin Receptor Blockers Failed - 01/16/2018  9:47 AM      Failed - Cr in normal range and within 180 days    Creatinine, Ser  Date Value Ref Range Status  03/16/2016 0.84 0.40 - 1.50 mg/dL Final         Failed - K in normal range and within 180 days    Potassium  Date Value Ref Range Status  03/16/2016 4.3 3.5 - 5.1 mEq/L Final         Passed - Patient is not pregnant      Passed - Last BP in normal range    BP Readings from Last 1 Encounters:  11/03/17 128/60         Passed - Valid encounter within last 6 months    Recent Outpatient Visits          2 months ago [place] at [place], NP   2 months ago Insect bite of left hand, subsequent Education administrator at [facility], [place], NP   2 months ago Insect bite of left hand, initial Education administrator at [facility], [place], NP   11 months ago Acute recurrent pansinusitis   Therapist, music at [facility], [place], NP   1 year ago [place]   [place], MD      Future Appointments            In 1 week Nafziger, [name], NP [facility] at [place], [facility]

## 2018-01-17 MED ORDER — LOSARTAN POTASSIUM 50 MG PO TABS: 50.0000 mg | ORAL_TABLET | Freq: Every day | ORAL | 0 refills | Status: DC

## 2018-01-17 NOTE — Telephone Encounter (Signed)
Sent to the pharmacy by e-scribe for 30 days. Pt has upcoming appt on 01/25/2018.

## 2018-01-25 ENCOUNTER — Encounter: Payer: Self-pay | Admitting: Adult Health

## 2018-01-25 ENCOUNTER — Ambulatory Visit (INDEPENDENT_AMBULATORY_CARE_PROVIDER_SITE_OTHER): Payer: PPO | Admitting: Adult Health

## 2018-01-25 VITALS — BP 152/82 | Temp 98.7°F | Ht 71.0 in | Wt 246.0 lb

## 2018-01-25 DIAGNOSIS — Z0000 Encounter for general adult medical examination without abnormal findings: Secondary | ICD-10-CM

## 2018-01-25 DIAGNOSIS — Z23 Encounter for immunization: Secondary | ICD-10-CM

## 2018-01-25 DIAGNOSIS — I10 Essential (primary) hypertension: Secondary | ICD-10-CM | POA: Diagnosis not present

## 2018-01-25 DIAGNOSIS — Z8546 Personal history of malignant neoplasm of prostate: Secondary | ICD-10-CM

## 2018-01-25 DIAGNOSIS — E782 Mixed hyperlipidemia: Secondary | ICD-10-CM

## 2018-01-25 DIAGNOSIS — J0140 Acute pansinusitis, unspecified: Secondary | ICD-10-CM

## 2018-01-25 LAB — LIPID PANEL
Cholesterol: 165 mg/dL (ref 0–200)
HDL: 41.7 mg/dL (ref >39.00)
LDL CALC: 106 mg/dL — AB (ref 0–99)
NonHDL: 123.52
Total CHOL/HDL Ratio: 4
Triglycerides: 90 mg/dL (ref 0.0–149.0)
VLDL: 18 mg/dL (ref 0.0–40.0)

## 2018-01-25 LAB — BASIC METABOLIC PANEL
BUN: 13 mg/dL (ref 6–23)
CALCIUM: 9.8 mg/dL (ref 8.4–10.5)
CO2: 27 mEq/L (ref 19–32)
CREATININE: 0.92 mg/dL (ref 0.40–1.50)
Chloride: 104 mEq/L (ref 96–112)
GFR: 80.67 mL/min (ref >60.00)
Glucose, Bld: 94 mg/dL (ref 70–99)
POTASSIUM: 4.7 meq/L (ref 3.5–5.1)
Sodium: 138 mEq/L (ref 135–145)

## 2018-01-25 LAB — PSA: PSA: 0.01 ng/mL — ABNORMAL LOW (ref 0.10–4.00)

## 2018-01-25 LAB — HEPATIC FUNCTION PANEL
ALK PHOS: 65 U/L (ref 39–117)
ALT: 26 U/L (ref 0–53)
AST: 22 U/L (ref 0–37)
Albumin: 4.2 g/dL (ref 3.5–5.2)
BILIRUBIN TOTAL: 1 mg/dL (ref 0.2–1.2)
Bilirubin, Direct: 0.2 mg/dL (ref 0.0–0.3)
Total Protein: 6.8 g/dL (ref 6.0–8.3)

## 2018-01-25 LAB — CBC WITH DIFFERENTIAL/PLATELET
BASOS ABS: 0.1 10*3/uL (ref 0.0–0.1)
Basophils Relative: 0.9 % (ref 0.0–3.0)
EOS ABS: 0.2 10*3/uL (ref 0.0–0.7)
EOS PCT: 2.7 % (ref 0.0–5.0)
HCT: 41 % (ref 39.0–52.0)
HEMOGLOBIN: 13.9 g/dL (ref 13.0–17.0)
Lymphocytes Relative: 27.1 % (ref 12.0–46.0)
Lymphs Abs: 2.1 10*3/uL (ref 0.7–4.0)
MCHC: 34 g/dL (ref 30.0–36.0)
MCV: 89.6 fl (ref 78.0–100.0)
MONO ABS: 0.6 10*3/uL (ref 0.1–1.0)
Monocytes Relative: 8 % (ref 3.0–12.0)
Neutro Abs: 4.7 10*3/uL (ref 1.4–7.7)
Neutrophils Relative %: 61.3 % (ref 43.0–77.0)
Platelets: 227 10*3/uL (ref 150.0–400.0)
RBC: 4.58 Mil/uL (ref 4.22–5.81)
RDW: 12.6 % (ref 11.5–15.5)
WBC: 7.6 10*3/uL (ref 4.0–10.5)

## 2018-01-25 LAB — TSH: TSH: 1.58 u[IU]/mL (ref 0.35–4.50)

## 2018-01-25 MED ORDER — DOXYCYCLINE HYCLATE 100 MG PO CAPS: 100.0000 mg | ORAL_CAPSULE | Freq: Two times a day (BID) | ORAL | 0 refills | Status: DC

## 2018-01-25 NOTE — Progress Notes (Signed)
Subjective:    Patient ID: Marcus Gomez., male    DOB: February 09, 1945, 73 y.o.   MRN: [id number]  HPI Patient presents for yearly preventative medicine examination. She is a pleasant 73 year old male who  has a past medical history of Essential hypertension and Prostate cancer ([place]).  Essential Hypertension - Takes Cozaar 50 mg daily  BP Readings from Last 3 Encounters:  01/25/18 (!) 152/82  11/03/17 128/60  10/24/17 140/68   Hyperlipidemia - diet controlled in the past. BP not at goal today as he has been taking multiple decongestants for sinusitis symptoms  BP Readings from Last 3 Encounters:  01/25/18 (!) 152/82  11/03/17 128/60  10/24/17 140/68   H/o prostate Cancer - is followed by urology on a yearly basis.  Lab Results  Component Value Date   PSA 0.01 (L) 03/16/2016   Acute Issues  - Reports 10 days of sinus pain pressure, productive cough, sinus pain and pressure, and headaches. He denies nausea, vomiting, diarrhea, or fevers. His symptoms are waxing and weaning.   Seasonal Allergies - Is followed by Allergy clinic. Currently prescribed Allegra, Pataday eye drops and Astelin nasal spray   All immunizations and health maintenance protocols were reviewed with the patient and needed orders were placed. utd   Appropriate screening laboratory values were ordered for the patient including screening of hyperlipidemia, renal function and hepatic function. If indicated by BPH, a PSA was ordered.  Medication reconciliation,  past medical history, social history, problem list and allergies were reviewed in detail with the patient  Goals were established with regard to weight loss, exercise, and  diet in compliance with medications. He has quit drinking alcohol and is working on diet. He does not eat a lot of sugars or carbs. He is exercising by using a stationary bike for 20 minutes twice a day. His chronic knee pain keeps him from going longer.   Wt Readings from  Last 3 Encounters:  01/25/18 246 lb (111.6 kg)  11/03/17 245 lb (111.1 kg)  10/24/17 245 lb 14.4 oz (111.5 kg)    Review of Systems  Constitutional: Negative.   HENT: Positive for congestion, postnasal drip, rhinorrhea, sinus pressure and sinus pain. Negative for sore throat and trouble swallowing.   Eyes: Negative.   Respiratory: Negative.   Cardiovascular: Negative.   Gastrointestinal: Negative.   Endocrine: Negative.   Genitourinary: Negative.   Musculoskeletal: Positive for arthralgias.  Skin: Negative.   Allergic/Immunologic: Negative.   Neurological: Negative.   Hematological: Negative.   Psychiatric/Behavioral: Negative.   All other systems reviewed and are negative.  Past Medical History:  Diagnosis Date  . Essential hypertension   . Prostate cancer ([place])    Remission    Social History   Socioeconomic History  . Marital status: Divorced    Spouse name: Not on file  . Number of children: Not on file  . Years of education: Not on file  . Highest education level: Not on file  Occupational History  . Not on file  Social Needs  . Financial resource strain: Not on file  . Food insecurity:    Worry: Not on file    Inability: Not on file  . Transportation needs:    Medical: Not on file    Non-medical: Not on file  Tobacco Use  . Smoking status: Never Smoker  . Smokeless tobacco: Never Used  Substance and Sexual Activity  . Alcohol use: Yes    Comment: socially   .  Drug use: No  . Sexual activity: Yes  Lifestyle  . Physical activity:    Days per week: Not on file    Minutes per session: Not on file  . Stress: Not on file  Relationships  . Social connections:    Talks on phone: Not on file    Gets together: Not on file    Attends religious service: Not on file    Active member of club or organization: Not on file    Attends meetings of clubs or organizations: Not on file    Relationship status: Not on file  . Intimate partner violence:    Fear of  current or ex partner: Not on file    Emotionally abused: Not on file    Physically abused: Not on file    Forced sexual activity: Not on file  Other Topics Concern  . 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        Past Surgical History:  Procedure Laterality Date  . KNEE SURGERY Bilateral 2015 & 2014  . SHOULDER SURGERY Right 2016    Family History  Problem Relation Age of Onset  . Stroke Mother   . Allergies Father   . COPD Father        never smoker  . Rheum arthritis Father   . Allergic rhinitis Neg Hx   . Angioedema Neg Hx   . Asthma Neg Hx   . Eczema Neg Hx   . Immunodeficiency Neg Hx   . Urticaria Neg Hx     Allergies  Allergen Reactions  . Lisinopril Cough    Current Outpatient Medications on File Prior to Visit  Medication Sig Dispense Refill  . azelastine (ASTELIN) 0.1 % nasal spray Place 2 sprays into both nostrils 2 (two) times daily as needed for rhinitis. Use in each nostril as directed 30 mL 5  . fexofenadine (ALLEGRA) 180 MG tablet Take 1 tablet (180 mg total) by mouth daily. 30 tablet 5  . losartan (COZAAR) 50 MG tablet Take 1 tablet (50 mg total) by mouth daily. 30 tablet 0  . Multiple Vitamins-Minerals (ICAPS AREDS 2) CAPS Take by mouth.    . Olopatadine HCl (PATADAY) 0.2 % SOLN Place 1 drop into both eyes 1 day or 1 dose. 1 Bottle 5   No current facility-administered medications on file prior to visit.     BP (!) 152/82   Temp 98.7 F (37.1 C)   Ht 5\' 11"  (1.803 m)   Wt 246 lb (111.6 kg)   BMI 34.31 kg/m       Objective:   Physical Exam Vitals signs and nursing note reviewed.  Constitutional:      General: He is not in acute distress.    Appearance: Normal appearance. He is well-developed. He is obese. He is not diaphoretic.  HENT:     Head: Normocephalic and atraumatic.     Right Ear: Tympanic membrane, ear canal and external ear normal. There is no  impacted cerumen.     Left Ear: Tympanic membrane, ear canal and external ear normal. There is no impacted cerumen.     Nose: Congestion and rhinorrhea present. Rhinorrhea is purulent.     Right Turbinates: Enlarged and swollen.     Left Turbinates: Enlarged and swollen.     Right Sinus: Maxillary sinus tenderness and frontal sinus tenderness present.  Left Sinus: Maxillary sinus tenderness and frontal sinus tenderness present.     Mouth/Throat:     Mouth: Mucous membranes are moist.     Pharynx: Oropharyngeal exudate present.  Eyes:     General:        Right eye: No discharge.        Left eye: No discharge.     Extraocular Movements: Extraocular movements intact.     Conjunctiva/sclera: Conjunctivae normal.     Pupils: Pupils are equal, round, and reactive to light.  Neck:     Thyroid: No thyromegaly.     Trachea: No tracheal deviation.  Cardiovascular:     Rate and Rhythm: Normal rate and regular rhythm.     Heart sounds: Normal heart sounds. No murmur. No friction rub. No gallop.   Pulmonary:     Effort: Pulmonary effort is normal. No respiratory distress.     Breath sounds: Normal breath sounds. No stridor. No wheezing, rhonchi or rales.  Chest:     Chest wall: No tenderness.  Abdominal:     General: Bowel sounds are normal. There is no distension.     Palpations: Abdomen is soft. There is no mass.     Tenderness: There is no abdominal tenderness. There is no right CVA tenderness, left CVA tenderness, guarding or rebound.     Hernia: No hernia is present.  Musculoskeletal: Normal range of motion.  Lymphadenopathy:     Cervical: No cervical adenopathy.  Skin:    General: Skin is warm and dry.     Coloration: Skin is not pale.     Findings: No erythema or rash.  Neurological:     Mental Status: He is alert and oriented to person, place, and time.     Cranial Nerves: No cranial nerve deficit.     Coordination: Coordination normal.  Psychiatric:        Thought Content:  Thought content normal.        Judgment: Judgment normal.       Assessment & Plan:  1. Routine general medical examination at a health care facility - Encouraged weight loss through diet and exercise  - Follow up in one year or sooner if needed - Basic metabolic panel - CBC with Differential/Platelet - Hepatic function panel - Lipid panel - TSH  2. Essential hypertension - Not at goal today but has been in the past.  - No change in medications  - Basic metabolic panel - CBC with Differential/Platelet - Hepatic function panel - Lipid panel - TSH  3. Mixed hyperlipidemia - Consider statin  - Basic metabolic panel - CBC with Differential/Platelet - Hepatic function panel - Lipid panel - TSH  4. Acute non-recurrent pansinusitis  - doxycycline (VIBRAMYCIN) 100 MG capsule; Take 1 capsule (100 mg total) by mouth 2 (two) times daily.  Dispense: 14 capsule; Refill: 0  5. Need for vaccination against Streptococcus pneumoniae  - Pneumococcal polysaccharide vaccine 23-valent greater than or equal to 2yo subcutaneous/IM  6. History of prostate cancer  - PSA  [name], NP

## 2018-01-25 NOTE — Patient Instructions (Signed)
It was great seeing you today   Please stop at the check out desk and sign a release of information for [facility] so we can see when your last colonoscopy was   I have sent in a prescription for Doxycyline to help with the sinus infection   We will follow up with you regarding your lab work

## 2018-02-01 ENCOUNTER — Other Ambulatory Visit: Payer: Self-pay | Admitting: Adult Health

## 2018-02-01 MED ORDER — AMOXICILLIN-POT CLAVULANATE 875-125 MG PO TABS: 1.0000 | ORAL_TABLET | Freq: Two times a day (BID) | ORAL | 0 refills | Status: DC

## 2018-02-08 DIAGNOSIS — L858 Other specified epidermal thickening: Secondary | ICD-10-CM | POA: Diagnosis not present

## 2018-02-20 ENCOUNTER — Other Ambulatory Visit: Payer: Self-pay | Admitting: Adult Health

## 2018-02-20 DIAGNOSIS — Z760 Encounter for issue of repeat prescription: Secondary | ICD-10-CM

## 2018-02-20 MED ORDER — LOSARTAN POTASSIUM 50 MG PO TABS: 50.0000 mg | ORAL_TABLET | Freq: Every day | ORAL | 1 refills | Status: DC

## 2018-02-20 NOTE — Telephone Encounter (Signed)
Copied from [place] [id number]. Topic: Quick Communication - Rx Refill/Question >> Feb 20, 2018  9:13 AM Antonieta Iba C wrote: Medication: losartan (COZAAR) 50 MG tablet   Has the patient contacted their pharmacy? No  (Agent: If no, request that the patient contact the pharmacy for the refill.) (Agent: If yes, when and what did the pharmacy advise?)  Preferred Pharmacy (with phone number or street name): [facility] ([address]), [place] - [place] [phone] (Phone) [phone] (Fax)    Agent: Please be advised that RX refills may take up to 3 business days. We ask that you follow-up with your pharmacy.

## 2018-02-20 NOTE — Telephone Encounter (Signed)
Requested Prescriptions  Pending Prescriptions Disp Refills  . losartan (COZAAR) 50 MG tablet 90 tablet 1    Sig: Take 1 tablet (50 mg total) by mouth daily.     Cardiovascular:  Angiotensin Receptor Blockers Failed - 02/20/2018  9:18 AM      Failed - Last BP in normal range    BP Readings from Last 1 Encounters:  01/25/18 (!) 152/82         Passed - Cr in normal range and within 180 days    Creatinine, Ser  Date Value Ref Range Status  01/25/2018 0.92 0.40 - 1.50 mg/dL Final         Passed - K in normal range and within 180 days    Potassium  Date Value Ref Range Status  01/25/2018 4.7 3.5 - 5.1 mEq/L Final         Passed - Patient is not pregnant      Passed - Valid encounter within last 6 months    Recent Outpatient Visits          3 weeks ago Routine general medical examination at a health care facility   [facility] at [place], [place], NP   3 months ago [place] at [facility], [place], NP   3 months ago Insect bite of left hand, subsequent Education administrator at [facility], [place], NP   4 months ago Insect bite of left hand, initial Education administrator at [facility], [place], NP   1 year ago Acute recurrent pansinusitis   Therapist, music at [facility], [place], NP

## 2018-04-27 ENCOUNTER — Encounter (INDEPENDENT_AMBULATORY_CARE_PROVIDER_SITE_OTHER): Payer: PPO | Admitting: Ophthalmology

## 2018-06-29 ENCOUNTER — Other Ambulatory Visit: Payer: Self-pay

## 2018-06-29 ENCOUNTER — Encounter (INDEPENDENT_AMBULATORY_CARE_PROVIDER_SITE_OTHER): Payer: PPO | Admitting: Ophthalmology

## 2018-06-29 DIAGNOSIS — H35033 Hypertensive retinopathy, bilateral: Secondary | ICD-10-CM

## 2018-06-29 DIAGNOSIS — H353131 Nonexudative age-related macular degeneration, bilateral, early dry stage: Secondary | ICD-10-CM | POA: Diagnosis not present

## 2018-06-29 DIAGNOSIS — I10 Essential (primary) hypertension: Secondary | ICD-10-CM

## 2018-06-29 DIAGNOSIS — H43813 Vitreous degeneration, bilateral: Secondary | ICD-10-CM | POA: Diagnosis not present

## 2018-06-29 DIAGNOSIS — H2513 Age-related nuclear cataract, bilateral: Secondary | ICD-10-CM

## 2018-07-09 ENCOUNTER — Other Ambulatory Visit: Payer: Self-pay

## 2018-07-09 ENCOUNTER — Ambulatory Visit (INDEPENDENT_AMBULATORY_CARE_PROVIDER_SITE_OTHER): Payer: PPO | Admitting: Allergy and Immunology

## 2018-07-09 ENCOUNTER — Telehealth: Payer: Self-pay

## 2018-07-09 ENCOUNTER — Encounter: Payer: Self-pay | Admitting: Allergy and Immunology

## 2018-07-09 DIAGNOSIS — J0100 Acute maxillary sinusitis, unspecified: Secondary | ICD-10-CM | POA: Diagnosis not present

## 2018-07-09 DIAGNOSIS — R05 Cough: Secondary | ICD-10-CM

## 2018-07-09 DIAGNOSIS — Z20822 Contact with and (suspected) exposure to covid-19: Secondary | ICD-10-CM

## 2018-07-09 DIAGNOSIS — K219 Gastro-esophageal reflux disease without esophagitis: Secondary | ICD-10-CM | POA: Diagnosis not present

## 2018-07-09 DIAGNOSIS — H1013 Acute atopic conjunctivitis, bilateral: Secondary | ICD-10-CM

## 2018-07-09 DIAGNOSIS — J3089 Other allergic rhinitis: Secondary | ICD-10-CM | POA: Diagnosis not present

## 2018-07-09 DIAGNOSIS — R059 Cough, unspecified: Secondary | ICD-10-CM

## 2018-07-09 MED ORDER — AZITHROMYCIN 250 MG PO TABS: ORAL_TABLET | ORAL | 0 refills | Status: DC

## 2018-07-09 MED ORDER — AZELASTINE HCL 0.1 % NA SOLN: 2.0000 | Freq: Two times a day (BID) | NASAL | 5 refills | Status: DC | PRN

## 2018-07-09 NOTE — Assessment & Plan Note (Signed)
   Continue appropriate allergen avoidance measures.  For now, treatment plan as outlined above for acute sinusitis.

## 2018-07-09 NOTE — Patient Instructions (Addendum)
Acute maxillary sinusitis  Refill prescription has been provided for azelastine nasal spray.  I have recommended that during this time he take 2 sprays per nostril twice daily.  Nasal saline lavage (NeilMed) has been recommended as needed and prior to medicated nasal sprays along with instructions for proper administration.  For thick post nasal drainage, add guaifenesin 1200 mg (Mucinex Maximum Strength)  twice daily as needed with adequate hydration as discussed.  A prescription has been provided for azithromycin, 500 mg on day 1 and 250 mg on days 2 through 5.  He will start this treatment if his symptoms persist, progress, or if he becomes febrile and/or develops discolored mucus.  A laboratory order form has been provided for COVID-19 swab.  When lab results have returned the patient will be called with further recommendations and follow up instructions.  Perennial and seasonal allergic rhinitis  Continue appropriate allergen avoidance measures.  For now, treatment plan as outlined above for acute sinusitis.  Coughing  This most likely represents upper airway cough syndrome.  Treatment plan as outlined above for acute sinusitis.  GERD (gastroesophageal reflux disease)  Continue appropriate reflux lifestyle modifications.  A prescription has been provided for famotidine (Pepcid) 20 mg twice daily.  He will take the Pepcid for the next 2 weeks until the cough has resolved.  Once the cough has resolved, he may use this medication as needed.   Return in about 4 months (around 11/08/2018), or if symptoms worsen or fail to improve.

## 2018-07-09 NOTE — Telephone Encounter (Signed)
rec'd referral from Dr. [place] Muta Bobbitt @ [facility] for COVID testing.  Called pt.  Scheduled for COVID testing appt. At 8:00 AM, 6/30 @ [facility].  Advised to wear a mask and to remain in the car for testing.  Pt. Verb. Understanding.

## 2018-07-09 NOTE — Assessment & Plan Note (Signed)
   This most likely represents upper airway cough syndrome.  Treatment plan as outlined above for acute sinusitis.

## 2018-07-09 NOTE — Assessment & Plan Note (Signed)
   Continue appropriate reflux lifestyle modifications.  A prescription has been provided for famotidine (Pepcid) 20 mg twice daily.  He will take the Pepcid for the next 2 weeks until the cough has resolved.  Once the cough has resolved, he may use this medication as needed.

## 2018-07-09 NOTE — Progress Notes (Signed)
Start [address] Location home End

## 2018-07-09 NOTE — Progress Notes (Signed)
Follow-up Telemedicine Note  RE: Marcus Gomez. MRN: [id number] DOB: [birth year] Date of Telemedicine Visit: 07/09/2018  Primary care provider: [name], NP Referring provider: [name], NP  Telemedicine Follow Up Visit via Telephone: I connected with Marcus Gomez for a follow up on 07/09/18 by telephone and verified that I am speaking with the correct person using two identifiers.   The limitations, risks, security and privacy concerns of performing an evaluation and management service by telemedicine, the availability of in person appointments, and that there may be a patient responsible charge related to this service were discussed. The patient expressed understanding and agreed to proceed.  Patient is at home.  Provider is at the office.  Visit start time: 10:40 AM Visit end time: 11:08 AM Insurance consent/check in by: Anderson [facility] Medical consent and medical assistant/nurse: Garlon Hatchet  History of present illness: Marcus Gomez. is a 73 y.o. male with allergic rhinoconjunctivitis and history of persistent cough presenting today via telemedicine for sick visit.  He reports that 2 days ago he began to experience nasal congestion, mild sinus pressure, postnasal drainage, and cough which was "really bad last night."  The cough has improved somewhat today, however Marcus Gomez is concerned about the possibility of COVID-19.  He has been checking his temperature and the highest it is gotten is 20 F.  He has experienced slight chest pain in the upper chest when coughing vigorously.  He has been attempting to address the symptoms with nasal saline irrigation.  He reports that he discontinued antacids but continues to attempt to avoid spicy foods and other foods which cause acid reflux.  Assessment and plan: Acute maxillary sinusitis  Refill prescription has been provided for azelastine nasal spray.  I have recommended that during this time he take 2 sprays  per nostril twice daily.  Nasal saline lavage (NeilMed) has been recommended as needed and prior to medicated nasal sprays along with instructions for proper administration.  For thick post nasal drainage, add guaifenesin 1200 mg (Mucinex Maximum Strength)  twice daily as needed with adequate hydration as discussed.  A prescription has been provided for azithromycin, 500 mg on day 1 and 250 mg on days 2 through 5.  He will start this treatment if his symptoms persist, progress, or if he becomes febrile and/or develops discolored mucus.  A laboratory order form has been provided for COVID-19 swab.  When lab results have returned the patient will be called with further recommendations and follow up instructions.  Perennial and seasonal allergic rhinitis  Continue appropriate allergen avoidance measures.  For now, treatment plan as outlined above for acute sinusitis.  Coughing  This most likely represents upper airway cough syndrome.  Treatment plan as outlined above for acute sinusitis.  GERD (gastroesophageal reflux disease)  Continue appropriate reflux lifestyle modifications.  A prescription has been provided for famotidine (Pepcid) 20 mg twice daily.  He will take the Pepcid for the next 2 weeks until the cough has resolved.  Once the cough has resolved, he may use this medication as needed.   Meds ordered this encounter  Medications  . azelastine (ASTELIN) 0.1 % nasal spray    Sig: Place 2 sprays into both nostrils 2 (two) times daily as needed for rhinitis. Use in each nostril as directed    Dispense:  30 mL    Refill:  5  . azithromycin (ZITHROMAX) 250 MG tablet    Sig: 500 mg on day 1 and 250 mg on days  2 through 5.    Dispense:  6 tablet    Refill:  0    Diagnostics: None.   Physical examination: Physical Exam Not obtained as encounter was done via telephone.   The following portions of the patient's history were reviewed and updated as appropriate:  allergies, current medications, past family history, past medical history, past social history, past surgical history and problem list.  Allergies as of 07/09/2018      Reactions   Lisinopril Cough      Medication List       Accurate as of July 09, 2018  2:25 PM. If you have any questions, ask your nurse or doctor.        STOP taking these medications   amoxicillin-clavulanate 875-125 MG tablet Commonly known as: Augmentin Stopped by: Edmonia Lynch, MD     TAKE these medications   azelastine 0.1 % nasal spray Commonly known as: ASTELIN Place 2 sprays into both nostrils 2 (two) times daily as needed for rhinitis. Use in each nostril as directed   azithromycin 250 MG tablet Commonly known as: ZITHROMAX 500 mg on day 1 and 250 mg on days 2 through 5. Started by: Edmonia Lynch, MD   fexofenadine 180 MG tablet Commonly known as: ALLEGRA Take 1 tablet (180 mg total) by mouth daily.   ICaps Areds 2 Caps Take by mouth.   losartan 50 MG tablet Commonly known as: Cozaar Take 1 tablet (50 mg total) by mouth daily.   Olopatadine HCl 0.2 % Soln Commonly known as: Pataday Place 1 drop into both eyes 1 day or 1 dose.       Allergies  Allergen Reactions  . Lisinopril Cough   Review of systems: Review of systems negative except as noted in HPI / PMHx or noted below: Constitutional: Negative.  HENT: Negative.   Eyes: Negative.  Respiratory: Negative.   Cardiovascular: Negative.  Gastrointestinal: Negative.  Genitourinary: Negative.  Musculoskeletal: Negative.  Neurological: Negative.  Endo/Heme/Allergies: Negative.  Cutaneous: Negative.  Past Medical History:  Diagnosis Date  . Essential hypertension   . Prostate cancer ([place])    Remission    Family History  Problem Relation Age of Onset  . Stroke Mother   . Allergies Father   . COPD Father        never Gomez  . Rheum arthritis Father   . Allergic rhinitis Neg Hx   . Angioedema Neg Hx   . Asthma  Neg Hx   . Eczema Neg Hx   . Immunodeficiency Neg Hx   . Urticaria Neg Hx     Social History   Socioeconomic History  . Marital status: Divorced    Spouse name: Not on file  . Number of children: Not on file  . Years of education: Not on file  . Highest education level: Not on file  Occupational History  . Not on file  Social Needs  . Financial resource strain: Not on file  . Food insecurity    Worry: Not on file    Inability: Not on file  . Transportation needs    Medical: Not on file    Non-medical: Not on file  Tobacco Use  . Smoking status: Never Gomez  . Smokeless tobacco: Never Used  Substance and Sexual Activity  . Alcohol use: Yes    Comment: socially   . Drug use: No  . Sexual activity: Yes  Lifestyle  . Physical activity    Days per week: Not  on file    Minutes per session: Not on file  . Stress: Not on file  Relationships  . Social Herbalist on phone: Not on file    Gets together: Not on file    Attends religious service: Not on file    Active member of club or organization: Not on file    Attends meetings of clubs or organizations: Not on file    Relationship status: Not on file  . Intimate partner violence    Fear of current or ex partner: Not on file    Emotionally abused: Not on file    Physically abused: Not on file    Forced sexual activity: Not on file  Other Topics Concern  . Not on file  Social History Narrative   Retired - Biomedical scientist    Divorced x 4    Two children - 2 girls both live local.        Previous notes and tests were reviewed.  I discussed the assessment and treatment plan with the patient. The patient was provided an opportunity to ask questions and all were answered. The patient agreed with the plan and demonstrated an understanding of the instructions.   The patient was advised to call back or seek an in-person evaluation if the symptoms worsen or if the condition fails to improve as anticipated.  I  provided 28 minutes of non-face-to-face time during this encounter.  I appreciate the opportunity to take part in Keelon's care. Please do not hesitate to contact me with questions.  Sincerely,   R. Edgar Frisk, MD

## 2018-07-09 NOTE — Assessment & Plan Note (Signed)
   Refill prescription has been provided for azelastine nasal spray.  I have recommended that during this time he take 2 sprays per nostril twice daily.  Nasal saline lavage (NeilMed) has been recommended as needed and prior to medicated nasal sprays along with instructions for proper administration.  For thick post nasal drainage, add guaifenesin 1200 mg (Mucinex Maximum Strength)  twice daily as needed with adequate hydration as discussed.  A prescription has been provided for azithromycin, 500 mg on day 1 and 250 mg on days 2 through 5.  He will start this treatment if his symptoms persist, progress, or if he becomes febrile and/or develops discolored mucus.  A laboratory order form has been provided for COVID-19 swab.  When lab results have returned the patient will be called with further recommendations and follow up instructions.

## 2018-07-10 ENCOUNTER — Other Ambulatory Visit: Payer: PPO

## 2018-07-10 DIAGNOSIS — Z20822 Contact with and (suspected) exposure to covid-19: Secondary | ICD-10-CM

## 2018-07-10 DIAGNOSIS — R6889 Other general symptoms and signs: Secondary | ICD-10-CM | POA: Diagnosis not present

## 2018-07-16 ENCOUNTER — Telehealth: Payer: Self-pay | Admitting: *Deleted

## 2018-07-16 LAB — NOVEL CORONAVIRUS, NAA: SARS-CoV-2, NAA: NOT DETECTED

## 2018-07-16 MED ORDER — FLUTICASONE PROPIONATE 50 MCG/ACT NA SUSP: 2.0000 | Freq: Two times a day (BID) | NASAL | 5 refills | Status: DC | PRN

## 2018-07-16 MED ORDER — PREDNISONE 10 MG PO TABS: ORAL_TABLET | ORAL | 0 refills | Status: AC

## 2018-07-16 NOTE — Telephone Encounter (Signed)
When did he have the COVID test?  We ordered it on June 29th and have not received results.

## 2018-07-16 NOTE — Telephone Encounter (Signed)
Regarding his sinus symptoms, please call in prednisone, 40 mg x3 days, 20 mg x1 day, 10 mg x1 day, then stop. Please ask that he use NeilMed nasal saline lavage followed by the azelastine nasal spray, 2 sprays per nostril twice daily for now.   In addition, he may add fluticasone nasal spray, 2 sprays per nostril daily for now. Please asked that he continues using guaifenesin 1200 mg twice daily with adequate hydration for now. If he develops fevers, chills, or discolored mucus, we will call in Augmentin at that time (please confirm that he has no allergies to penicillin). Thank you.

## 2018-07-16 NOTE — Telephone Encounter (Signed)
Also, if he wants to start immunotherapy injections please have him make a first shot appointment at the office which he would like to get the injections. Thank you.

## 2018-07-16 NOTE — Telephone Encounter (Signed)
I believe the results are in the chart now.

## 2018-07-16 NOTE — Telephone Encounter (Signed)
The COVID test was ordered on June 29th. When did he actually have the test? We have not received the results yet. Has he had any fevers, chills, or discolored mucus production?

## 2018-07-16 NOTE — Addendum Note (Signed)
Addended by: Lucrezia Starch I on: 07/16/2018 04:45 PM   Modules accepted: Orders

## 2018-07-16 NOTE — Telephone Encounter (Signed)
He denies all symptoms. He is still able to taste and smell food. Last "fever" was noted on 07-10-2018 99.1 or 99.2.

## 2018-07-16 NOTE — Addendum Note (Signed)
Addended by: Katherina Right D on: 07/16/2018 04:44 PM   Modules accepted: Orders

## 2018-07-16 NOTE — Telephone Encounter (Signed)
flonase and prednisone sent to pharmacy

## 2018-07-16 NOTE — Telephone Encounter (Signed)
Pt called stating that Dr.Bobbit had called in some medication last week. Pt states he is a little better but still does not feel well. States he has a lot of mucus and drainage. He did go and get tested for Covid but hasn't heard back about the results yet. He is wanting advice on what can help. He is also wondering about starting allergy injections.

## 2018-07-16 NOTE — Telephone Encounter (Signed)
07-10-2018 performed at [facility].

## 2018-07-16 NOTE — Telephone Encounter (Signed)
LVM for pt to return call

## 2018-07-17 NOTE — Telephone Encounter (Signed)
Pt returned call. She was informed of Dr. Barnett Hatter message. He will call back if his symtoms get worse. I mailed him allergy shot info and he will call back to schedule start apt.

## 2018-09-01 ENCOUNTER — Other Ambulatory Visit: Payer: Self-pay | Admitting: Adult Health

## 2018-09-01 DIAGNOSIS — Z760 Encounter for issue of repeat prescription: Secondary | ICD-10-CM

## 2018-09-04 NOTE — Telephone Encounter (Signed)
DENIED.  DUPLICATE REQUEST.  THIS HAS BEEN FILLED.

## 2018-09-04 NOTE — Telephone Encounter (Signed)
Sent to the pharmacy by e-scribe. 

## 2018-10-18 ENCOUNTER — Telehealth: Payer: Self-pay | Admitting: Allergy and Immunology

## 2018-10-18 NOTE — Telephone Encounter (Signed)
Pt called and said that he is still coughing and got color drainage and sore throat and thinks he needs antib. [facility] [address] [place]. [phone]

## 2018-10-18 NOTE — Telephone Encounter (Signed)
Switch from nasal saline spray to NeilMed nasal saline rinse. Please send in a prescription for azelastine/fluticasone nasal spray (generic), 1 spray per nostril twice daily. Add guaifenesin 1200 mg (Mucinex Maximum Strength)  twice daily as needed with adequate hydration as discussed.  If he continues to have problems despite these recommendations, please have him come in for a follow-up visit in person or via telemedicine.  Thanks.

## 2018-10-18 NOTE — Telephone Encounter (Signed)
C/o of sinus issue and is taking Mucinex bid, nasal saline spray, Flonase spray bid and nothing seems to be helping. Patient states its gets better then worse. This has been happening off and on 3 weeks. Claims he isn't  taking his antihistamines at all. No symptoms of shortness of breath. Patient hasn't travel recently and doesn't believe he has any symptoms of COVID-19. Please advise on what he may need to do. I advise him to try taking Zyrtec, Xyzal or Allegra for now.

## 2018-10-19 MED ORDER — AZELASTINE-FLUTICASONE 137-50 MCG/ACT NA SUSP: NASAL | 5 refills | Status: DC

## 2018-10-19 NOTE — Telephone Encounter (Signed)
Prescription sent to [facility] in [place]. Called and informed patient of Dr. Mariane Masters suggestions. Patient stated he would pick up new nose spray and try the Mucinex Maximum Strength and return call to office if this didn't help.

## 2018-10-22 ENCOUNTER — Other Ambulatory Visit: Payer: Self-pay

## 2018-10-22 ENCOUNTER — Ambulatory Visit (INDEPENDENT_AMBULATORY_CARE_PROVIDER_SITE_OTHER): Payer: PPO | Admitting: Allergy and Immunology

## 2018-10-22 ENCOUNTER — Ambulatory Visit
Admission: RE | Admit: 2018-10-22 | Discharge: 2018-10-22 | Disposition: A | Discharge status: Home / Self Care | Payer: PPO | Source: Ambulatory Visit | Attending: Allergy and Immunology | Admitting: Allergy and Immunology

## 2018-10-22 ENCOUNTER — Encounter: Payer: Self-pay | Admitting: Allergy and Immunology

## 2018-10-22 VITALS — BP 160/90 | HR 67 | Temp 97.9°F | Resp 16 | Ht 69.69 in | Wt 241.0 lb

## 2018-10-22 DIAGNOSIS — J3089 Other allergic rhinitis: Secondary | ICD-10-CM

## 2018-10-22 DIAGNOSIS — K219 Gastro-esophageal reflux disease without esophagitis: Secondary | ICD-10-CM | POA: Diagnosis not present

## 2018-10-22 DIAGNOSIS — R062 Wheezing: Secondary | ICD-10-CM

## 2018-10-22 DIAGNOSIS — R05 Cough: Secondary | ICD-10-CM | POA: Diagnosis not present

## 2018-10-22 DIAGNOSIS — R059 Cough, unspecified: Secondary | ICD-10-CM

## 2018-10-22 MED ORDER — IPRATROPIUM BROMIDE 0.06 % NA SOLN: 2.0000 | Freq: Three times a day (TID) | NASAL | 5 refills | Status: DC

## 2018-10-22 MED ORDER — HYDROCOD POLST-CPM POLST ER 10-8 MG/5ML PO SUER: 5.0000 mL | Freq: Two times a day (BID) | ORAL | 0 refills | Status: AC | PRN

## 2018-10-22 MED ORDER — FAMOTIDINE 20 MG PO TABS: 20.0000 mg | ORAL_TABLET | Freq: Two times a day (BID) | ORAL | 1 refills | Status: DC

## 2018-10-22 MED ORDER — CARBINOXAMINE MALEATE 4 MG PO TABS: ORAL_TABLET | ORAL | 0 refills | Status: DC

## 2018-10-22 MED ORDER — PSEUDOEPHEDRINE-GUAIFENESIN ER 120-1200 MG PO TB12: 1200.0000 mg | ORAL_TABLET | Freq: Two times a day (BID) | ORAL | 1 refills | Status: DC

## 2018-10-22 NOTE — Progress Notes (Signed)
Follow-up Note  RE: Marcus Gomez. MRN: [id number] DOB: Aug 10, 1945 Date of Office Visit: 10/22/2018  Primary care provider: [name], NP Referring provider: [name], NP  History of present illness: Marcus Gomez" Marcus Gomez. is a 73 y.o. male with allergic rhinitis, gastroesophageal reflux, and history of coughing presenting today for sick visit.  He was previously evaluated via telemedicine on July 09, 2018.  He reports that recently he has suffered a severe cough.  He reports that at times he coughs almost to the point of vomiting and has been disrupting his sleep at night.  He reports that he woke up at 3:30 AM coughing with some wheezing.  He has been experiencing thick postnasal drainage as well as sinus pressure over the forehead.  He denies fevers, chills, discolored mucus production.  He has been attempting to control the symptoms with nasal saline lavage, Mucinex, azelastine/fluticasone nasal spray, and fexofenadine.  He denies heartburn.  Assessment and plan: Coughing The history suggests upper airway cough syndrome.  A chest x-ray, PA and lateral, has been ordered.  Prednisone has been provided, 40 mg x3 days, 20 mg x1 day, 10 mg x1 day, then stop.  A prescription has been provided for Tussionex ER suspension, 5 mL every 12 hours over the next 3 days, then stop.  Treatment plan as outlined below.    Perennial and seasonal allergic rhinitis  For now, continue appropriate allergen avoidance measures and azelastine/fluticasone nasal spray.  I have recommended that during this time he take 2 sprays per nostril twice daily.  A prescription has been provided for ipratropium 0.06% nasal spray, 2 sprays per nostril 2 or 3 times daily as needed.  Nasal saline lavage (NeilMed) has been recommended as needed and prior to medicated nasal sprays along with instructions for proper administration.  For thick post nasal drainage, add guaifenesin 1200 mg  (Mucinex Maximum Strength)  twice daily as needed with adequate hydration as discussed.  A prescription has been provided for carbinoxamine 4 mg every 6-8 hours as needed.  Discontinue fexofenadine (Allegra).  If allergen avoidance measures and medications fail to adequately relieve symptoms, aeroallergen immunotherapy will be considered.  GERD (gastroesophageal reflux disease)  Continue appropriate reflux lifestyle modifications.  A prescription has been provided for famotidine (Pepcid) 20 mg twice daily.  He will take the Pepcid for the next 2 weeks until the cough has resolved.  Once the cough has resolved, he may use this medication as needed.   Meds ordered this encounter  Medications  . chlorpheniramine-HYDROcodone (TUSSIONEX PENNKINETIC ER) 10-8 MG/5ML SUER    Sig: Take 5 mLs by mouth every 12 (twelve) hours as needed for up to 3 days.    Dispense:  60 mL    Refill:  0  . ipratropium (ATROVENT) 0.06 % nasal spray    Sig: Place 2 sprays into both nostrils 3 (three) times daily.    Dispense:  15 mL    Refill:  5  . Carbinoxamine Maleate 4 MG TABS    Sig: 4 mg every 6-8 hours prn    Dispense:  60 tablet    Refill:  0  . famotidine (PEPCID) 20 MG tablet    Sig: Take 1 tablet (20 mg total) by mouth 2 (two) times daily.    Dispense:  60 tablet    Refill:  1  . Pseudoephedrine-Guaifenesin (MUCINEX D MAX STRENGTH) [id number] MG TB12    Sig: Take 1,200 mg by mouth 2 (two) times daily.  Dispense:  20 tablet    Refill:  1    Diagnostics: Spirometry:  Normal with an FEV1 of 92% predicted and an FEV1 ratio of 111%.  Please see scanned spirometry results for details.    Physical examination: Blood pressure (!) 160/90, pulse 67, temperature 97.9 F (36.6 C), temperature source Temporal, resp. rate 16, height 5' 9.69" (1.77 m), weight 241 lb (109.3 kg), SpO2 96 %.  General: Alert, interactive, in no acute distress. HEENT: TMs pearly gray, turbinates edematous with thick  discharge, post-pharynx erythematous. Neck: Supple without lymphadenopathy. Lungs: Clear to auscultation without wheezing, rhonchi or rales. CV: Normal S1, S2 without murmurs. Skin: Warm and dry, without lesions or rashes.  The following portions of the patient's history were reviewed and updated as appropriate: allergies, current medications, past family history, past medical history, past social history, past surgical history and problem list.  Current Outpatient Medications  Medication Sig Dispense Refill  . Azelastine-Fluticasone 137-50 MCG/ACT SUSP Use 1 spray in each nostril twice daily 23 g 5  . fexofenadine (ALLEGRA) 180 MG tablet Take 1 tablet (180 mg total) by mouth daily. 30 tablet 5  . losartan (COZAAR) 50 MG tablet Take 1 tablet by mouth once daily 90 tablet 1  . Multiple Vitamins-Minerals (VISION-VITE PRESERVE PO) Take by mouth.    . Olopatadine HCl (PATADAY) 0.2 % SOLN Place 1 drop into both eyes 1 day or 1 dose. 1 Bottle 5  . Turmeric (QC TUMERIC COMPLEX PO) Take by mouth.    . Carbinoxamine Maleate 4 MG TABS 4 mg every 6-8 hours prn 60 tablet 0  . chlorpheniramine-HYDROcodone (TUSSIONEX PENNKINETIC ER) 10-8 MG/5ML SUER Take 5 mLs by mouth every 12 (twelve) hours as needed for up to 3 days. 60 mL 0  . famotidine (PEPCID) 20 MG tablet Take 1 tablet (20 mg total) by mouth 2 (two) times daily. 60 tablet 1  . ipratropium (ATROVENT) 0.06 % nasal spray Place 2 sprays into both nostrils 3 (three) times daily. 15 mL 5  . Pseudoephedrine-Guaifenesin (MUCINEX D MAX STRENGTH) [id number] MG TB12 Take 1,200 mg by mouth 2 (two) times daily. 20 tablet 1   No current facility-administered medications for this visit.     Allergies  Allergen Reactions  . Lisinopril Cough   Review of systems: Review of systems negative except as noted in HPI / PMHx or noted below: Constitutional: Negative.  HENT: Negative.   Eyes: Negative.  Respiratory: Negative.   Cardiovascular: Negative.   Gastrointestinal: Negative.  Genitourinary: Negative.  Musculoskeletal: Negative.  Neurological: Negative.  Endo/Heme/Allergies: Negative.  Cutaneous: Negative.  Past Medical History:  Diagnosis Date  . Essential hypertension   . Prostate cancer ([place])    Remission    Family History  Problem Relation Age of Onset  . Stroke Mother   . Allergies Father   . COPD Father        never smoker  . Rheum arthritis Father   . Allergic rhinitis Neg Hx   . Angioedema Neg Hx   . Asthma Neg Hx   . Eczema Neg Hx   . Immunodeficiency Neg Hx   . Urticaria Neg Hx     Social History   Socioeconomic History  . Marital status: Divorced    Spouse name: Not on file  . Number of children: Not on file  . Years of education: Not on file  . Highest education level: Not on file  Occupational History  . Not on file  Social Needs  .  Financial resource strain: Not on file  . Food insecurity    Worry: Not on file    Inability: Not on file  . Transportation needs    Medical: Not on file    Non-medical: Not on file  Tobacco Use  . Smoking status: Never Smoker  . Smokeless tobacco: Never Used  Substance and Sexual Activity  . Alcohol use: Yes    Comment: socially   . Drug use: No  . Sexual activity: Yes  Lifestyle  . Physical activity    Days per week: Not on file    Minutes per session: Not on file  . Stress: Not on file  Relationships  . Social Herbalist on phone: Not on file    Gets together: Not on file    Attends religious service: Not on file    Active member of club or organization: Not on file    Attends meetings of clubs or organizations: Not on file    Relationship status: Not on file  . Intimate partner violence    Fear of current or ex partner: Not on file    Emotionally abused: Not on file    Physically abused: Not on file    Forced sexual activity: Not on file  Other Topics Concern  . Not on file  Social History Narrative   Retired - Biomedical scientist     Divorced x 4    Two children - 2 girls both live local.        I appreciate the opportunity to take part in Manasseh's care. Please do not hesitate to contact me with questions.  Sincerely,   R. Edgar Frisk, MD

## 2018-10-22 NOTE — Assessment & Plan Note (Addendum)
The history suggests upper airway cough syndrome.  A chest x-ray, PA and lateral, has been ordered.  Prednisone has been provided, 40 mg x3 days, 20 mg x1 day, 10 mg x1 day, then stop.  A prescription has been provided for Tussionex ER suspension, 5 mL every 12 hours over the next 3 days, then stop.  Treatment plan as outlined below.

## 2018-10-22 NOTE — Patient Instructions (Addendum)
Coughing The history suggests upper airway cough syndrome.  A chest x-ray, PA and lateral, has been ordered.  Prednisone has been provided, 40 mg x3 days, 20 mg x1 day, 10 mg x1 day, then stop.  A prescription has been provided for Tussionex ER suspension, 5 mL every 12 hours over the next 3 days, then stop.  Treatment plan as outlined below.    Perennial and seasonal allergic rhinitis  For now, continue appropriate allergen avoidance measures and azelastine/fluticasone nasal spray.  I have recommended that during this time he take 2 sprays per nostril twice daily.  A prescription has been provided for ipratropium 0.06% nasal spray, 2 sprays per nostril 2 or 3 times daily as needed.  Nasal saline lavage (NeilMed) has been recommended as needed and prior to medicated nasal sprays along with instructions for proper administration.  For thick post nasal drainage, add guaifenesin 1200 mg (Mucinex Maximum Strength)  twice daily as needed with adequate hydration as discussed.  A prescription has been provided for carbinoxamine 4 mg every 6-8 hours as needed.  Discontinue fexofenadine (Allegra).  If allergen avoidance measures and medications fail to adequately relieve symptoms, aeroallergen immunotherapy will be considered.  GERD (gastroesophageal reflux disease)  Continue appropriate reflux lifestyle modifications.  A prescription has been provided for famotidine (Pepcid) 20 mg twice daily.  He will take the Pepcid for the next 2 weeks until the cough has resolved.  Once the cough has resolved, he may use this medication as needed.   Return in about 5 months (around 03/22/2019), or if symptoms worsen or fail to improve.

## 2018-10-22 NOTE — Assessment & Plan Note (Signed)
   For now, continue appropriate allergen avoidance measures and azelastine/fluticasone nasal spray.  I have recommended that during this time he take 2 sprays per nostril twice daily.  A prescription has been provided for ipratropium 0.06% nasal spray, 2 sprays per nostril 2 or 3 times daily as needed.  Nasal saline lavage (NeilMed) has been recommended as needed and prior to medicated nasal sprays along with instructions for proper administration.  For thick post nasal drainage, add guaifenesin 1200 mg (Mucinex Maximum Strength)  twice daily as needed with adequate hydration as discussed.  A prescription has been provided for carbinoxamine 4 mg every 6-8 hours as needed.  Discontinue fexofenadine (Allegra).  If allergen avoidance measures and medications fail to adequately relieve symptoms, aeroallergen immunotherapy will be considered.

## 2018-10-22 NOTE — Assessment & Plan Note (Signed)
   Continue appropriate reflux lifestyle modifications.  A prescription has been provided for famotidine (Pepcid) 20 mg twice daily.  He will take the Pepcid for the next 2 weeks until the cough has resolved.  Once the cough has resolved, he may use this medication as needed. 

## 2018-10-23 ENCOUNTER — Telehealth: Payer: Self-pay | Admitting: *Deleted

## 2018-10-23 NOTE — Telephone Encounter (Signed)
Received fax from [facility] in [place] that they cannot order carbinoxamine- pt will need to call around to other pharmacies to see if there is another that has it in stock and transfer to that pharmacy. Pt informed.

## 2018-10-26 ENCOUNTER — Telehealth: Payer: Self-pay

## 2018-10-26 ENCOUNTER — Telehealth: Payer: Self-pay | Admitting: Allergy and Immunology

## 2018-10-26 MED ORDER — AZITHROMYCIN 250 MG PO TABS: ORAL_TABLET | ORAL | 0 refills | Status: DC

## 2018-10-26 NOTE — Telephone Encounter (Signed)
Dr Verlin Fester, received a fax from [facility]:  Tussionex is on back order.  Is there something else that the patient can have?    Please advise:

## 2018-10-26 NOTE — Telephone Encounter (Signed)
Call to patient, states that he is not getting any better.  Feels the congestion in his chest.  Using all the medications as prescribed, still not helping.  Spoke with Dr Nelva Bush, she suggested Z-pack for 5 days. Prescription sent to [facility] on file.  Lost connection with patient.  Made 4 attempts to call back.  No answer.

## 2018-10-26 NOTE — Telephone Encounter (Signed)
Patient called stating that he was seen on Monday, 10/22/2018 with Dr Verlin Fester and he has taken all prescribed medications, but still has not had any relief.   Patient has used Azelastine-Fluticasone and Atrovent and has been taking Carbinoxamine, Pepcid and Mucinex DM. Patient still has a bad cough, conjestion, and has gotten no sleep because of this.  Patient would like recommendations on what to do for relief.  Please advise.

## 2018-10-30 DIAGNOSIS — J301 Allergic rhinitis due to pollen: Secondary | ICD-10-CM | POA: Diagnosis not present

## 2018-10-30 NOTE — Telephone Encounter (Signed)
Please call the pharmacy to find out what equivalents are available.

## 2018-10-31 DIAGNOSIS — J3089 Other allergic rhinitis: Secondary | ICD-10-CM | POA: Diagnosis not present

## 2018-10-31 NOTE — Progress Notes (Signed)
VIALS EXP 10-31-19

## 2018-10-31 NOTE — Telephone Encounter (Signed)
Call to the pharmacy.  Tussionex is still on back order.  They have Vertussin AC in stock and can fill that.  You will need to print it and sign it.  We can't send this one through epic unless you have the app on your phone or the fingerprint scanner.

## 2018-11-06 NOTE — Telephone Encounter (Signed)
Virtussin AC 10 mL every 4 hours as needed. To be used for 3 days. Patient is NOT to exceed 60 mL in a 24 hour period.

## 2018-11-07 MED ORDER — PREDNISONE 10 MG PO TABS: ORAL_TABLET | ORAL | 0 refills | Status: DC

## 2018-11-07 MED ORDER — GUAIFENESIN-CODEINE 100-10 MG/5ML PO SYRP: 10.0000 mL | ORAL_SOLUTION | Freq: Three times a day (TID) | ORAL | 0 refills | Status: DC | PRN

## 2018-11-07 NOTE — Addendum Note (Signed)
Addended by: Katherina Right D on: 11/07/2018 09:41 AM   Modules accepted: Orders

## 2018-11-07 NOTE — Telephone Encounter (Signed)
Virtussin AC 10 mL is pended to be printed. [place] can you print and have Dr. Verlin Fester sign and fax to patient pharmacy

## 2018-11-07 NOTE — Addendum Note (Signed)
Addended by: Valere Dross on: 11/07/2018 09:26 AM   Modules accepted: Orders

## 2018-11-07 NOTE — Telephone Encounter (Addendum)
Script printed, signed and faxed to [facility].   Per Dr. Verlin Fester I sent in a baby pak of prednisone for the pt to take with the cough syrup. The cough syrup he will take for 3 days.   Pt aware.

## 2018-11-12 ENCOUNTER — Other Ambulatory Visit: Payer: Self-pay

## 2018-11-12 ENCOUNTER — Ambulatory Visit: Payer: Self-pay | Admitting: Allergy and Immunology

## 2018-11-12 ENCOUNTER — Telehealth: Payer: Self-pay | Admitting: *Deleted

## 2018-11-12 ENCOUNTER — Ambulatory Visit (INDEPENDENT_AMBULATORY_CARE_PROVIDER_SITE_OTHER): Payer: PPO

## 2018-11-12 DIAGNOSIS — J309 Allergic rhinitis, unspecified: Secondary | ICD-10-CM | POA: Diagnosis not present

## 2018-11-12 MED ORDER — EPINEPHRINE 0.3 MG/0.3ML IJ SOAJ: 0.3000 mg | Freq: Once | INTRAMUSCULAR | 1 refills | Status: AC | PRN

## 2018-11-12 NOTE — Telephone Encounter (Signed)
PA for Azelastine-Fluticasone has been submitted via CoverMyMeds and is currently pending approval or denial.

## 2018-11-12 NOTE — Progress Notes (Signed)
Immunotherapy   Patient Details  Name: Marcus Gomez. MRN: [id number] Date of Birth: 01-05-1946  11/12/2018   Marcus Gomez. started allergy injections today. Patient received 0.05 of both his blue vials. One with G-T-DM-D and the other with M-C-CR. Patient waited in an exam room for 30 minutes with no problems. Following schedule: A Frequency: 1-2 times weekly per Dr. Verlin Fester Epi-Pen: One was sent in, patient will pick it up prior to next injection. Consent signed and patient instructions given.   [name] 11/12/2018, 9:25 AM

## 2018-11-14 ENCOUNTER — Ambulatory Visit (INDEPENDENT_AMBULATORY_CARE_PROVIDER_SITE_OTHER): Payer: PPO | Admitting: *Deleted

## 2018-11-14 DIAGNOSIS — J309 Allergic rhinitis, unspecified: Secondary | ICD-10-CM | POA: Diagnosis not present

## 2018-11-14 NOTE — Telephone Encounter (Signed)
PA has been approved. Faxing approval notification to the pharmacy.

## 2018-11-14 NOTE — Telephone Encounter (Signed)
Pharmacy verified approval.

## 2018-11-14 NOTE — Telephone Encounter (Signed)
PA Case: [id number], Status: Approved, Coverage Starts on: 11/13/2018 12:00:00 AM, Coverage Ends on: 01/10/2020 12:00:00 AM.

## 2018-11-19 ENCOUNTER — Ambulatory Visit (INDEPENDENT_AMBULATORY_CARE_PROVIDER_SITE_OTHER): Payer: PPO

## 2018-11-19 DIAGNOSIS — J309 Allergic rhinitis, unspecified: Secondary | ICD-10-CM | POA: Diagnosis not present

## 2018-11-22 ENCOUNTER — Ambulatory Visit (INDEPENDENT_AMBULATORY_CARE_PROVIDER_SITE_OTHER): Payer: PPO | Admitting: *Deleted

## 2018-11-22 DIAGNOSIS — J309 Allergic rhinitis, unspecified: Secondary | ICD-10-CM

## 2018-11-26 ENCOUNTER — Ambulatory Visit (INDEPENDENT_AMBULATORY_CARE_PROVIDER_SITE_OTHER): Payer: PPO

## 2018-11-26 DIAGNOSIS — J309 Allergic rhinitis, unspecified: Secondary | ICD-10-CM | POA: Diagnosis not present

## 2018-11-28 ENCOUNTER — Ambulatory Visit (INDEPENDENT_AMBULATORY_CARE_PROVIDER_SITE_OTHER): Payer: PPO

## 2018-11-28 DIAGNOSIS — J309 Allergic rhinitis, unspecified: Secondary | ICD-10-CM | POA: Diagnosis not present

## 2018-12-03 ENCOUNTER — Ambulatory Visit (INDEPENDENT_AMBULATORY_CARE_PROVIDER_SITE_OTHER): Payer: PPO

## 2018-12-03 DIAGNOSIS — J309 Allergic rhinitis, unspecified: Secondary | ICD-10-CM

## 2018-12-05 ENCOUNTER — Ambulatory Visit (INDEPENDENT_AMBULATORY_CARE_PROVIDER_SITE_OTHER): Payer: PPO | Admitting: *Deleted

## 2018-12-05 DIAGNOSIS — J309 Allergic rhinitis, unspecified: Secondary | ICD-10-CM

## 2018-12-10 ENCOUNTER — Ambulatory Visit (INDEPENDENT_AMBULATORY_CARE_PROVIDER_SITE_OTHER): Payer: PPO | Admitting: *Deleted

## 2018-12-10 DIAGNOSIS — J309 Allergic rhinitis, unspecified: Secondary | ICD-10-CM | POA: Diagnosis not present

## 2018-12-12 ENCOUNTER — Ambulatory Visit (INDEPENDENT_AMBULATORY_CARE_PROVIDER_SITE_OTHER): Payer: PPO

## 2018-12-12 DIAGNOSIS — J309 Allergic rhinitis, unspecified: Secondary | ICD-10-CM | POA: Diagnosis not present

## 2018-12-17 ENCOUNTER — Ambulatory Visit (INDEPENDENT_AMBULATORY_CARE_PROVIDER_SITE_OTHER): Payer: PPO

## 2018-12-17 DIAGNOSIS — J309 Allergic rhinitis, unspecified: Secondary | ICD-10-CM

## 2018-12-19 ENCOUNTER — Ambulatory Visit (INDEPENDENT_AMBULATORY_CARE_PROVIDER_SITE_OTHER): Payer: PPO

## 2018-12-19 DIAGNOSIS — J309 Allergic rhinitis, unspecified: Secondary | ICD-10-CM | POA: Diagnosis not present

## 2018-12-24 ENCOUNTER — Ambulatory Visit (INDEPENDENT_AMBULATORY_CARE_PROVIDER_SITE_OTHER): Payer: PPO

## 2018-12-24 DIAGNOSIS — J309 Allergic rhinitis, unspecified: Secondary | ICD-10-CM

## 2018-12-26 ENCOUNTER — Ambulatory Visit (INDEPENDENT_AMBULATORY_CARE_PROVIDER_SITE_OTHER): Payer: PPO

## 2018-12-26 DIAGNOSIS — J309 Allergic rhinitis, unspecified: Secondary | ICD-10-CM | POA: Diagnosis not present

## 2018-12-31 ENCOUNTER — Ambulatory Visit (INDEPENDENT_AMBULATORY_CARE_PROVIDER_SITE_OTHER): Payer: PPO

## 2018-12-31 DIAGNOSIS — J309 Allergic rhinitis, unspecified: Secondary | ICD-10-CM | POA: Diagnosis not present

## 2019-01-02 ENCOUNTER — Ambulatory Visit (INDEPENDENT_AMBULATORY_CARE_PROVIDER_SITE_OTHER): Payer: PPO | Admitting: *Deleted

## 2019-01-02 DIAGNOSIS — J309 Allergic rhinitis, unspecified: Secondary | ICD-10-CM

## 2019-01-07 ENCOUNTER — Ambulatory Visit (INDEPENDENT_AMBULATORY_CARE_PROVIDER_SITE_OTHER): Payer: PPO

## 2019-01-07 DIAGNOSIS — J309 Allergic rhinitis, unspecified: Secondary | ICD-10-CM

## 2019-01-09 ENCOUNTER — Ambulatory Visit (INDEPENDENT_AMBULATORY_CARE_PROVIDER_SITE_OTHER): Payer: PPO | Admitting: *Deleted

## 2019-01-09 ENCOUNTER — Other Ambulatory Visit: Payer: Self-pay | Admitting: Allergy and Immunology

## 2019-01-09 DIAGNOSIS — J309 Allergic rhinitis, unspecified: Secondary | ICD-10-CM | POA: Diagnosis not present

## 2019-01-14 ENCOUNTER — Ambulatory Visit (INDEPENDENT_AMBULATORY_CARE_PROVIDER_SITE_OTHER): Payer: PPO | Admitting: *Deleted

## 2019-01-14 DIAGNOSIS — J309 Allergic rhinitis, unspecified: Secondary | ICD-10-CM

## 2019-01-16 ENCOUNTER — Ambulatory Visit (INDEPENDENT_AMBULATORY_CARE_PROVIDER_SITE_OTHER): Payer: PPO | Admitting: *Deleted

## 2019-01-16 DIAGNOSIS — J309 Allergic rhinitis, unspecified: Secondary | ICD-10-CM | POA: Diagnosis not present

## 2019-01-21 ENCOUNTER — Ambulatory Visit (INDEPENDENT_AMBULATORY_CARE_PROVIDER_SITE_OTHER): Payer: PPO

## 2019-01-21 DIAGNOSIS — J309 Allergic rhinitis, unspecified: Secondary | ICD-10-CM

## 2019-01-23 ENCOUNTER — Ambulatory Visit (INDEPENDENT_AMBULATORY_CARE_PROVIDER_SITE_OTHER): Payer: PPO | Admitting: *Deleted

## 2019-01-23 DIAGNOSIS — J309 Allergic rhinitis, unspecified: Secondary | ICD-10-CM

## 2019-01-28 ENCOUNTER — Ambulatory Visit (INDEPENDENT_AMBULATORY_CARE_PROVIDER_SITE_OTHER): Payer: PPO

## 2019-01-28 DIAGNOSIS — J309 Allergic rhinitis, unspecified: Secondary | ICD-10-CM

## 2019-01-30 ENCOUNTER — Encounter: Payer: Self-pay | Admitting: *Deleted

## 2019-01-31 ENCOUNTER — Ambulatory Visit (INDEPENDENT_AMBULATORY_CARE_PROVIDER_SITE_OTHER): Payer: PPO

## 2019-01-31 DIAGNOSIS — J309 Allergic rhinitis, unspecified: Secondary | ICD-10-CM

## 2019-02-04 ENCOUNTER — Ambulatory Visit (INDEPENDENT_AMBULATORY_CARE_PROVIDER_SITE_OTHER): Payer: PPO

## 2019-02-04 DIAGNOSIS — J309 Allergic rhinitis, unspecified: Secondary | ICD-10-CM | POA: Diagnosis not present

## 2019-02-07 ENCOUNTER — Ambulatory Visit (INDEPENDENT_AMBULATORY_CARE_PROVIDER_SITE_OTHER): Payer: PPO

## 2019-02-07 DIAGNOSIS — J309 Allergic rhinitis, unspecified: Secondary | ICD-10-CM | POA: Diagnosis not present

## 2019-02-11 ENCOUNTER — Ambulatory Visit (INDEPENDENT_AMBULATORY_CARE_PROVIDER_SITE_OTHER): Payer: PPO

## 2019-02-11 DIAGNOSIS — J309 Allergic rhinitis, unspecified: Secondary | ICD-10-CM

## 2019-02-14 ENCOUNTER — Ambulatory Visit (INDEPENDENT_AMBULATORY_CARE_PROVIDER_SITE_OTHER): Payer: PPO

## 2019-02-14 DIAGNOSIS — J309 Allergic rhinitis, unspecified: Secondary | ICD-10-CM

## 2019-02-18 ENCOUNTER — Ambulatory Visit (INDEPENDENT_AMBULATORY_CARE_PROVIDER_SITE_OTHER): Payer: PPO

## 2019-02-18 DIAGNOSIS — J309 Allergic rhinitis, unspecified: Secondary | ICD-10-CM

## 2019-02-25 ENCOUNTER — Ambulatory Visit (INDEPENDENT_AMBULATORY_CARE_PROVIDER_SITE_OTHER): Payer: PPO

## 2019-02-25 DIAGNOSIS — J309 Allergic rhinitis, unspecified: Secondary | ICD-10-CM

## 2019-03-03 ENCOUNTER — Other Ambulatory Visit: Payer: Self-pay | Admitting: Adult Health

## 2019-03-03 DIAGNOSIS — Z760 Encounter for issue of repeat prescription: Secondary | ICD-10-CM

## 2019-03-04 ENCOUNTER — Ambulatory Visit (INDEPENDENT_AMBULATORY_CARE_PROVIDER_SITE_OTHER): Payer: PPO

## 2019-03-04 DIAGNOSIS — J309 Allergic rhinitis, unspecified: Secondary | ICD-10-CM

## 2019-03-11 ENCOUNTER — Ambulatory Visit (INDEPENDENT_AMBULATORY_CARE_PROVIDER_SITE_OTHER): Payer: PPO

## 2019-03-11 DIAGNOSIS — J309 Allergic rhinitis, unspecified: Secondary | ICD-10-CM

## 2019-03-18 ENCOUNTER — Ambulatory Visit (INDEPENDENT_AMBULATORY_CARE_PROVIDER_SITE_OTHER): Payer: PPO

## 2019-03-18 DIAGNOSIS — J309 Allergic rhinitis, unspecified: Secondary | ICD-10-CM | POA: Diagnosis not present

## 2019-04-01 ENCOUNTER — Other Ambulatory Visit: Payer: Self-pay

## 2019-04-01 ENCOUNTER — Ambulatory Visit (INDEPENDENT_AMBULATORY_CARE_PROVIDER_SITE_OTHER): Payer: PPO | Admitting: Allergy and Immunology

## 2019-04-01 ENCOUNTER — Ambulatory Visit: Payer: Self-pay

## 2019-04-01 ENCOUNTER — Encounter: Payer: Self-pay | Admitting: Allergy and Immunology

## 2019-04-01 DIAGNOSIS — K219 Gastro-esophageal reflux disease without esophagitis: Secondary | ICD-10-CM

## 2019-04-01 DIAGNOSIS — J309 Allergic rhinitis, unspecified: Secondary | ICD-10-CM

## 2019-04-01 DIAGNOSIS — J3089 Other allergic rhinitis: Secondary | ICD-10-CM | POA: Diagnosis not present

## 2019-04-01 MED ORDER — AZELASTINE-FLUTICASONE 137-50 MCG/ACT NA SUSP: NASAL | 5 refills | Status: DC

## 2019-04-01 NOTE — Assessment & Plan Note (Signed)
   Continue appropriate reflux lifestyle modifications and famotidine (Pepcid) 20 mg 1-2 times daily if needed.

## 2019-04-01 NOTE — Patient Instructions (Addendum)
Perennial and seasonal allergic rhinitis Improving on immunotherapy.  Continue appropriate allergen avoidance measures, immunotherapy injections per protocol, and azelastine nasal spray, 1 spray per nostril twice daily if needed.  Nasal saline lavage (NeilMed) has been recommended as needed and prior to medicated nasal sprays along with instructions for proper administration.  GERD (gastroesophageal reflux disease)  Continue appropriate reflux lifestyle modifications and famotidine (Pepcid) 20 mg 1-2 times daily if needed.   Return in about 1 year (around 03/31/2020), or if symptoms worsen or fail to improve.

## 2019-04-01 NOTE — Assessment & Plan Note (Signed)
Improving on immunotherapy.  Continue appropriate allergen avoidance measures, immunotherapy injections per protocol, and azelastine nasal spray, 1 spray per nostril twice daily if needed.  Nasal saline lavage (NeilMed) has been recommended as needed and prior to medicated nasal sprays along with instructions for proper administration.

## 2019-04-01 NOTE — Progress Notes (Signed)
Follow-up Note  RE: Marcus Gomez. MRN: [id number] DOB: 1945-02-04 Date of Office Visit: 04/01/2019  Primary care provider: [name], NP Referring provider: [name], NP  History of present illness: Marcus Gomez" Marcus Gomez. is a 74 y.o. male allergic rhinoconjunctivitis, gastroesophageal reflux, and history of coughing presenting today for follow-up.  He was last seen in this clinic in October 2020.  He has been receiving immunotherapy injections without problems or complications.  He reports that the immunotherapy injections have "helped immensely" with regards to symptom reduction and reduction of nasal spray requirement.  He continues to use nasal saline lavage on a daily basis.  He reports that his reflux has been stable and only requires famotidine sporadically.  He has no new problems or complaints today.  Assessment and plan: Perennial and seasonal allergic rhinitis Improving on immunotherapy.  Continue appropriate allergen avoidance measures, immunotherapy injections per protocol, and azelastine nasal spray, 1 spray per nostril twice daily if needed.  Nasal saline lavage (NeilMed) has been recommended as needed and prior to medicated nasal sprays along with instructions for proper administration.  GERD (gastroesophageal reflux disease)  Continue appropriate reflux lifestyle modifications and famotidine (Pepcid) 20 mg 1-2 times daily if needed.   Meds ordered this encounter  Medications  . Azelastine-Fluticasone 137-50 MCG/ACT SUSP    Sig: Use 1 spray in each nostril twice daily    Dispense:  23 g    Refill:  5    Physical examination: Blood pressure 138/76, pulse 60, temperature 98 F (36.7 C), temperature source Temporal, resp. rate 18, SpO2 100 %.  General: Alert, interactive, in no acute distress. HEENT: TMs pearly gray, turbinates minimally edematous without discharge, post-pharynx mildly erythematous. Neck: Supple without  lymphadenopathy. Lungs: Clear to auscultation without wheezing, rhonchi or rales. CV: Normal S1, S2 without murmurs. Skin: Warm and dry, without lesions or rashes.  The following portions of the patient's history were reviewed and updated as appropriate: allergies, current medications, past family history, past medical history, past social history, past surgical history and problem list.  Current Outpatient Medications  Medication Sig Dispense Refill  . Azelastine-Fluticasone 137-50 MCG/ACT SUSP Use 1 spray in each nostril twice daily 23 g 5  . Carbinoxamine Maleate 4 MG TABS 4 mg every 6-8 hours prn 60 tablet 0  . EPINEPHrine 0.3 mg/0.3 mL IJ SOAJ injection Inject 0.3 mLs (0.3 mg total) into the muscle once as needed for up to 1 dose for anaphylaxis. 0.3 mL 1  . ipratropium (ATROVENT) 0.06 % nasal spray Place 2 sprays into both nostrils 3 (three) times daily. 15 mL 5  . losartan (COZAAR) 50 MG tablet Take 1 tablet (50 mg total) by mouth daily. Due for physical 30 tablet 0  . Olopatadine HCl (PATADAY) 0.2 % SOLN Place 1 drop into both eyes 1 day or 1 dose. 1 Bottle 5  . Turmeric (QC TUMERIC COMPLEX PO) Take by mouth.    Marland Kitchen azithromycin (ZITHROMAX Z-PAK) 250 MG tablet Take 2 tablets the 1st day, then take 1 tablet daily until finished (Patient not taking: Reported on 04/01/2019) 6 each 0  . famotidine (PEPCID) 20 MG tablet Take 1 tablet by mouth twice daily (Patient not taking: Reported on 04/01/2019) 60 tablet 2  . fexofenadine (ALLEGRA) 180 MG tablet Take 1 tablet (180 mg total) by mouth daily. (Patient not taking: Reported on 04/01/2019) 30 tablet 5  . guaiFENesin-codeine (VIRTUSSIN A/C) 100-10 MG/5ML syrup Take 10 mLs by mouth 3 (three) times daily  as needed for cough. (Patient not taking: Reported on 04/01/2019) 120 mL 0  . Multiple Vitamins-Minerals (VISION-VITE PRESERVE PO) Take by mouth.    . predniSONE (DELTASONE) 10 MG tablet Take 1 tablet twice daily for 4 days, then 1 tablet on day 5, then  stop. (Patient not taking: Reported on 04/01/2019) 9 tablet 0  . Pseudoephedrine-Guaifenesin (MUCINEX D MAX STRENGTH) [id number] MG TB12 Take 1,200 mg by mouth 2 (two) times daily. (Patient not taking: Reported on 04/01/2019) 20 tablet 1   No current facility-administered medications for this visit.    Allergies  Allergen Reactions  . Lisinopril Cough    I appreciate the opportunity to take part in [name]'s care. Please do not hesitate to contact me with questions.  Sincerely,   R. Edgar Frisk, MD

## 2019-04-02 ENCOUNTER — Other Ambulatory Visit: Payer: Self-pay | Admitting: Adult Health

## 2019-04-02 DIAGNOSIS — Z760 Encounter for issue of repeat prescription: Secondary | ICD-10-CM

## 2019-04-02 NOTE — Telephone Encounter (Signed)
30 DAY SUPPLY SENT TO THE PHARMACY WITH MESSAGE TO SCHEDULE CPX.

## 2019-04-08 ENCOUNTER — Ambulatory Visit (INDEPENDENT_AMBULATORY_CARE_PROVIDER_SITE_OTHER): Payer: PPO

## 2019-04-08 DIAGNOSIS — J309 Allergic rhinitis, unspecified: Secondary | ICD-10-CM

## 2019-04-15 ENCOUNTER — Ambulatory Visit (INDEPENDENT_AMBULATORY_CARE_PROVIDER_SITE_OTHER): Payer: PPO

## 2019-04-15 DIAGNOSIS — J309 Allergic rhinitis, unspecified: Secondary | ICD-10-CM

## 2019-04-23 ENCOUNTER — Ambulatory Visit (INDEPENDENT_AMBULATORY_CARE_PROVIDER_SITE_OTHER): Payer: PPO

## 2019-04-23 DIAGNOSIS — J309 Allergic rhinitis, unspecified: Secondary | ICD-10-CM

## 2019-04-29 ENCOUNTER — Ambulatory Visit (INDEPENDENT_AMBULATORY_CARE_PROVIDER_SITE_OTHER): Payer: PPO

## 2019-04-29 DIAGNOSIS — J309 Allergic rhinitis, unspecified: Secondary | ICD-10-CM

## 2019-05-02 ENCOUNTER — Other Ambulatory Visit: Payer: Self-pay | Admitting: Adult Health

## 2019-05-02 DIAGNOSIS — Z760 Encounter for issue of repeat prescription: Secondary | ICD-10-CM

## 2019-05-03 NOTE — Telephone Encounter (Signed)
DENIED.  PT NEEDS YEARLY CPX.

## 2019-05-06 ENCOUNTER — Ambulatory Visit (INDEPENDENT_AMBULATORY_CARE_PROVIDER_SITE_OTHER): Payer: PPO

## 2019-05-06 DIAGNOSIS — J309 Allergic rhinitis, unspecified: Secondary | ICD-10-CM | POA: Diagnosis not present

## 2019-05-13 ENCOUNTER — Ambulatory Visit (INDEPENDENT_AMBULATORY_CARE_PROVIDER_SITE_OTHER): Payer: PPO

## 2019-05-13 DIAGNOSIS — J309 Allergic rhinitis, unspecified: Secondary | ICD-10-CM | POA: Diagnosis not present

## 2019-05-20 ENCOUNTER — Ambulatory Visit (INDEPENDENT_AMBULATORY_CARE_PROVIDER_SITE_OTHER): Payer: PPO

## 2019-05-20 DIAGNOSIS — J309 Allergic rhinitis, unspecified: Secondary | ICD-10-CM | POA: Diagnosis not present

## 2019-05-20 NOTE — Progress Notes (Signed)
VIALS EXP 05-19-20 

## 2019-05-21 DIAGNOSIS — J3089 Other allergic rhinitis: Secondary | ICD-10-CM | POA: Diagnosis not present

## 2019-05-22 ENCOUNTER — Other Ambulatory Visit: Payer: Self-pay | Admitting: Adult Health

## 2019-05-22 DIAGNOSIS — Z760 Encounter for issue of repeat prescription: Secondary | ICD-10-CM

## 2019-05-22 DIAGNOSIS — J3081 Allergic rhinitis due to animal (cat) (dog) hair and dander: Secondary | ICD-10-CM | POA: Diagnosis not present

## 2019-05-22 NOTE — Telephone Encounter (Signed)
Denied.  Past due for cpx. 

## 2019-05-27 ENCOUNTER — Ambulatory Visit (INDEPENDENT_AMBULATORY_CARE_PROVIDER_SITE_OTHER): Payer: PPO

## 2019-05-27 DIAGNOSIS — J309 Allergic rhinitis, unspecified: Secondary | ICD-10-CM | POA: Diagnosis not present

## 2019-05-29 ENCOUNTER — Telehealth: Payer: Self-pay | Admitting: Allergy and Immunology

## 2019-05-29 NOTE — Telephone Encounter (Signed)
Any fevers or chills? By the way, he needs to be careful about the pseudoephedrine and the Mucinex D because it can raise blood pressure.

## 2019-05-29 NOTE — Telephone Encounter (Signed)
Patient is already taking Mucinex-D along with the azelastine-fluticasone nose spray. He is doing the nasal rinses prior to nasal sprays as well. He is making sure to stay well hydrated.

## 2019-05-29 NOTE — Telephone Encounter (Signed)
She should be using nasal saline lavage followed by azelastine nasal spray, 2 sprays per nostril twice daily. In addition,Add guaifenesin [phone] mg (Mucinex)  twice daily as needed with adequate hydration. Follow-up if this problem persists or progresses.

## 2019-05-29 NOTE — Telephone Encounter (Signed)
Patient called and said that he got his allergy shot on Monday and then he started having yellow drainage and thinks he has a sinus infection. No fever. [facility] [address] [place]. [phone].

## 2019-05-29 NOTE — Telephone Encounter (Signed)
Called and left a voicemail asking for patient to return call to discuss.  °

## 2019-05-29 NOTE — Telephone Encounter (Signed)
I spoke with patient. He has had nonproductive cough, yellow sinus drainage, sinus headache for 2-3 days. Patient is using nasal saline lavage as well. Please advise on further instructions and thank you.

## 2019-05-30 MED ORDER — PREDNISONE 10 MG PO TABS: ORAL_TABLET | ORAL | 0 refills | Status: AC

## 2019-05-30 NOTE — Telephone Encounter (Signed)
I spoke with him this morning. He is not as congested as yesterday, but still has some stuffiness. The cough is much better. Yellow nasal drainage.

## 2019-05-30 NOTE — Telephone Encounter (Signed)
I have sent the prescription in to the patient's requested pharmacy. I also spoke with [facility] and let him know that he should only start this if his symptoms worsen.

## 2019-05-30 NOTE — Telephone Encounter (Signed)
No fever or chills reported yesterday. I do apologize about the later reply, I had left for the day. I will let him know about the elevated blood pressure from those two meds.

## 2019-05-30 NOTE — Telephone Encounter (Signed)
Please call in prednisone, 20 mg x 4 days, 10 mg x1 day, then stop. If his symptoms continue to improve, he shouldn't start it. However, he can start this if his symptoms start to increase.  Thanks.

## 2019-06-11 ENCOUNTER — Ambulatory Visit (INDEPENDENT_AMBULATORY_CARE_PROVIDER_SITE_OTHER): Payer: PPO

## 2019-06-11 DIAGNOSIS — J309 Allergic rhinitis, unspecified: Secondary | ICD-10-CM

## 2019-06-17 ENCOUNTER — Ambulatory Visit (INDEPENDENT_AMBULATORY_CARE_PROVIDER_SITE_OTHER): Payer: PPO

## 2019-06-17 DIAGNOSIS — J309 Allergic rhinitis, unspecified: Secondary | ICD-10-CM | POA: Diagnosis not present

## 2019-06-24 ENCOUNTER — Ambulatory Visit (INDEPENDENT_AMBULATORY_CARE_PROVIDER_SITE_OTHER): Payer: PPO

## 2019-06-24 DIAGNOSIS — J309 Allergic rhinitis, unspecified: Secondary | ICD-10-CM

## 2019-07-01 ENCOUNTER — Ambulatory Visit (INDEPENDENT_AMBULATORY_CARE_PROVIDER_SITE_OTHER): Payer: PPO

## 2019-07-01 DIAGNOSIS — J309 Allergic rhinitis, unspecified: Secondary | ICD-10-CM | POA: Diagnosis not present

## 2019-07-02 ENCOUNTER — Encounter (INDEPENDENT_AMBULATORY_CARE_PROVIDER_SITE_OTHER): Payer: PPO | Admitting: Ophthalmology

## 2019-07-02 ENCOUNTER — Other Ambulatory Visit: Payer: Self-pay

## 2019-07-02 DIAGNOSIS — H353132 Nonexudative age-related macular degeneration, bilateral, intermediate dry stage: Secondary | ICD-10-CM

## 2019-07-02 DIAGNOSIS — H35033 Hypertensive retinopathy, bilateral: Secondary | ICD-10-CM

## 2019-07-02 DIAGNOSIS — H43813 Vitreous degeneration, bilateral: Secondary | ICD-10-CM | POA: Diagnosis not present

## 2019-07-02 DIAGNOSIS — I10 Essential (primary) hypertension: Secondary | ICD-10-CM

## 2019-07-08 ENCOUNTER — Ambulatory Visit (INDEPENDENT_AMBULATORY_CARE_PROVIDER_SITE_OTHER): Payer: PPO

## 2019-07-08 DIAGNOSIS — J309 Allergic rhinitis, unspecified: Secondary | ICD-10-CM

## 2019-07-16 ENCOUNTER — Ambulatory Visit (INDEPENDENT_AMBULATORY_CARE_PROVIDER_SITE_OTHER): Payer: PPO

## 2019-07-16 DIAGNOSIS — J309 Allergic rhinitis, unspecified: Secondary | ICD-10-CM

## 2019-07-24 ENCOUNTER — Ambulatory Visit (INDEPENDENT_AMBULATORY_CARE_PROVIDER_SITE_OTHER): Payer: PPO | Admitting: Adult Health

## 2019-07-24 ENCOUNTER — Encounter: Payer: Self-pay | Admitting: Adult Health

## 2019-07-24 ENCOUNTER — Other Ambulatory Visit: Payer: Self-pay

## 2019-07-24 VITALS — BP 150/78 | Temp 98.3°F | Wt 247.0 lb

## 2019-07-24 DIAGNOSIS — Z0000 Encounter for general adult medical examination without abnormal findings: Secondary | ICD-10-CM | POA: Diagnosis not present

## 2019-07-24 DIAGNOSIS — Z8546 Personal history of malignant neoplasm of prostate: Secondary | ICD-10-CM

## 2019-07-24 DIAGNOSIS — I10 Essential (primary) hypertension: Secondary | ICD-10-CM | POA: Diagnosis not present

## 2019-07-24 DIAGNOSIS — E782 Mixed hyperlipidemia: Secondary | ICD-10-CM | POA: Diagnosis not present

## 2019-07-24 MED ORDER — LOSARTAN POTASSIUM 50 MG PO TABS: 50.0000 mg | ORAL_TABLET | Freq: Every day | ORAL | 3 refills | Status: DC

## 2019-07-24 NOTE — Progress Notes (Signed)
Subjective:    Patient ID: Marcus Gomez., male    DOB: 28-Nov-1945, 74 y.o.   MRN: [id number]  HPI  Patient presents for yearly preventative medicine examination. He is a pleasant 74 year old male who  has a past medical history of Essential hypertension and Prostate cancer ([place]).  Essential Hypertension -takes Cozaar 50 mg daily.  His blood pressure is elevated today but he has been out of medication for approximately 5 weeks.  He does not monitor his blood pressure at home on a routine basis.  He denies dizziness, lightheadedness, chest pain, or shortness of breath BP Readings from Last 3 Encounters:  07/24/19 (!) 150/78  04/01/19 138/76  10/22/18 (!) 160/90   Hyperlipidemia -diet controlled in the past Lab Results  Component Value Date   CHOL 165 01/25/2018   HDL 41.70 01/25/2018   LDLCALC 106 (H) 01/25/2018   TRIG 90.0 01/25/2018   CHOLHDL 4 01/25/2018   H/o of prostate cancer -in the past has been followed by urology on a yearly basis.  He has not been back to see neurology for at least a year.  All immunizations and health maintenance protocols were reviewed with the patient and needed orders were placed.  Appropriate screening laboratory values were ordered for the patient including screening of hyperlipidemia, renal function and hepatic function. If indicated by BPH, a PSA was ordered.  Medication reconciliation,  past medical history, social history, problem list and allergies were reviewed in detail with the patient  Goals were established with regard to weight loss, exercise, and  diet in compliance with medications  Wt Readings from Last 3 Encounters:  07/24/19 247 lb (112 kg)  10/22/18 241 lb (109.3 kg)  01/25/18 246 lb (111.6 kg)   He is up-to-date on routine colon cancer screening  Review of Systems  Constitutional: Negative.   HENT: Negative.   Eyes: Negative.   Respiratory: Negative.   Cardiovascular: Negative.   Gastrointestinal:  Negative.   Endocrine: Negative.   Genitourinary: Negative.   Musculoskeletal: Negative.   Skin: Negative.   Allergic/Immunologic: Negative.   Neurological: Negative.   Hematological: Negative.   Psychiatric/Behavioral: Negative.   All other systems reviewed and are negative.  Past Medical History:  Diagnosis Date  . Essential hypertension   . Prostate cancer ([place])    Remission    Social History   Socioeconomic History  . Marital status: Divorced    Spouse name: Not on file  . Number of children: Not on file  . Years of education: Not on file  . Highest education level: Not on file  Occupational History  . Not on file  Tobacco Use  . Smoking status: Never Smoker  . Smokeless tobacco: Never Used  Vaping Use  . Vaping Use: Never used  Substance and Sexual Activity  . Alcohol use: Yes    Comment: socially   . Drug use: No  . Sexual activity: Yes  Other Topics Concern  . 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       Social Determinants of Health   Financial Resource Strain:   . Difficulty of Paying Living Expenses:   Food Insecurity:   . Worried About Charity fundraiser in the Last Year:   . [place] in the Last Year:   Transportation Needs:   . Lack of  Transportation (Medical):   Marland Kitchen Lack of Transportation (Non-Medical):   Physical Activity:   . Days of Exercise per Week:   . Minutes of Exercise per Session:   Stress:   . Feeling of Stress :   Social Connections:   . Frequency of Communication with Friends and Family:   . Frequency of Social Gatherings with Friends and Family:   . Attends Religious Services:   . Active Member of Clubs or Organizations:   . Attends Archivist Meetings:   Marland Kitchen Marital Status:   Intimate Partner Violence:   . Fear of Current or Ex-Partner:   . Emotionally Abused:   Marland Kitchen Physically Abused:   . Sexually Abused:     Past  Surgical History:  Procedure Laterality Date  . KNEE SURGERY Bilateral 2015 & 2014  . SHOULDER SURGERY Right 2016    Family History  Problem Relation Age of Onset  . Stroke Mother   . Allergies Father   . COPD Father        never smoker  . Rheum arthritis Father   . Allergic rhinitis Neg Hx   . Angioedema Neg Hx   . Asthma Neg Hx   . Eczema Neg Hx   . Immunodeficiency Neg Hx   . Urticaria Neg Hx     Allergies  Allergen Reactions  . Lisinopril Cough    Current Outpatient Medications on File Prior to Visit  Medication Sig Dispense Refill  . EPINEPHrine 0.3 mg/0.3 mL IJ SOAJ injection Inject 0.3 mLs (0.3 mg total) into the muscle once as needed for up to 1 dose for anaphylaxis. 0.3 mL 1  . ipratropium (ATROVENT) 0.06 % nasal spray Place 2 sprays into both nostrils 3 (three) times daily. 15 mL 5  . Multiple Vitamins-Minerals (ONE-A-DAY MENS 50+ PO) Take by mouth.    . Multiple Vitamins-Minerals (PRESERVISION AREDS 2+MULTI VIT PO) Take by mouth.    . Olopatadine HCl (PATADAY) 0.2 % SOLN Place 1 drop into both eyes 1 day or 1 dose. 1 Bottle 5  . Turmeric (QC TUMERIC COMPLEX PO) Take by mouth.     No current facility-administered medications on file prior to visit.    BP (!) 150/78   Temp 98.3 F (36.8 C)   Wt 247 lb (112 kg)   BMI 35.76 kg/m       Objective:   Physical Exam Vitals and nursing note reviewed.  Constitutional:      General: He is not in acute distress.    Appearance: Normal appearance. He is well-developed and normal weight.  HENT:     Head: Normocephalic and atraumatic.     Right Ear: Tympanic membrane, ear canal and external ear normal. There is no impacted cerumen.     Left Ear: Tympanic membrane, ear canal and external ear normal. There is no impacted cerumen.     Nose: Nose normal. No congestion or rhinorrhea.     Mouth/Throat:     Mouth: Mucous membranes are moist.     Pharynx: Oropharynx is clear. No oropharyngeal exudate or posterior  oropharyngeal erythema.  Eyes:     General:        Right eye: No discharge.        Left eye: No discharge.     Extraocular Movements: Extraocular movements intact.     Conjunctiva/sclera: Conjunctivae normal.     Pupils: Pupils are equal, round, and reactive to light.  Neck:     Vascular: No carotid bruit.  Trachea: No tracheal deviation.  Cardiovascular:     Rate and Rhythm: Normal rate and regular rhythm.     Pulses: Normal pulses.     Heart sounds: Normal heart sounds. No murmur heard.  No friction rub. No gallop.   Pulmonary:     Effort: Pulmonary effort is normal. No respiratory distress.     Breath sounds: Normal breath sounds. No stridor. No wheezing, rhonchi or rales.  Chest:     Chest wall: No tenderness.  Abdominal:     General: Bowel sounds are normal. There is no distension.     Palpations: Abdomen is soft. There is no mass.     Tenderness: There is no abdominal tenderness. There is no right CVA tenderness, left CVA tenderness, guarding or rebound.     Hernia: No hernia is present.  Musculoskeletal:        General: No swelling, tenderness, deformity or signs of injury. Normal range of motion.     Right lower leg: No edema.     Left lower leg: No edema.  Lymphadenopathy:     Cervical: No cervical adenopathy.  Skin:    General: Skin is warm and dry.     Capillary Refill: Capillary refill takes less than 2 seconds.     Coloration: Skin is not jaundiced or pale.     Findings: No bruising, erythema, lesion or rash.  Neurological:     General: No focal deficit present.     Mental Status: He is alert and oriented to person, place, and time.     Cranial Nerves: No cranial nerve deficit.     Sensory: No sensory deficit.     Motor: No weakness.     Coordination: Coordination normal.     Gait: Gait normal.     Deep Tendon Reflexes: Reflexes normal.  Psychiatric:        Mood and Affect: Mood normal.        Behavior: Behavior normal.        Thought Content: Thought  content normal.        Judgment: Judgment normal.       Assessment & Plan:  1. Routine general medical examination at a health care facility - Needs to work on weight loss through diet and exercise - Follow up in one year or sooner if needed - CBC with Differential/Platelet - Comprehensive metabolic panel - Lipid panel - PSA - TSH  2. Essential hypertension - Restart on Cozaar 50 mg daily.  - encouraged to monitor BP at home  - CBC with Differential/Platelet - Comprehensive metabolic panel - Lipid panel - PSA - TSH - losartan (COZAAR) 50 MG tablet; Take 1 tablet (50 mg total) by mouth daily.  Dispense: 90 tablet; Refill: 3  3. Mixed hyperlipidemia - Consider statin  - CBC with Differential/Platelet - Comprehensive metabolic panel - Lipid panel - PSA - TSH  4. History of prostate cancer  - PSA  [name], NP

## 2019-07-25 ENCOUNTER — Other Ambulatory Visit: Payer: Self-pay | Admitting: Family Medicine

## 2019-07-25 ENCOUNTER — Telehealth: Payer: Self-pay | Admitting: Adult Health

## 2019-07-25 LAB — PSA: PSA: <0.1 ng/mL (ref <=4.0)

## 2019-07-25 LAB — COMPREHENSIVE METABOLIC PANEL
AG Ratio: 1.8 (calc) (ref 1.0–2.5)
ALT: 19 U/L (ref 9–46)
AST: 19 U/L (ref 10–35)
Albumin: 4.2 g/dL (ref 3.6–5.1)
Alkaline phosphatase (APISO): 70 U/L (ref 35–144)
BUN: 14 mg/dL (ref 7–25)
CO2: 24 mmol/L (ref 20–32)
Calcium: 9.3 mg/dL (ref 8.6–10.3)
Chloride: 104 mmol/L (ref 98–110)
Creat: 0.88 mg/dL (ref 0.70–1.18)
Globulin: 2.3 g/dL (calc) (ref 1.9–3.7)
Glucose, Bld: 111 mg/dL — ABNORMAL HIGH (ref 65–99)
Potassium: 4.7 mmol/L (ref 3.5–5.3)
Sodium: 138 mmol/L (ref 135–146)
Total Bilirubin: 1.2 mg/dL (ref 0.2–1.2)
Total Protein: 6.5 g/dL (ref 6.1–8.1)

## 2019-07-25 LAB — CBC WITH DIFFERENTIAL/PLATELET
Absolute Monocytes: 546 cells/uL (ref 200–950)
Basophils Absolute: 33 cells/uL (ref 0–200)
Basophils Relative: 0.5 %
Eosinophils Absolute: 130 cells/uL (ref 15–500)
Eosinophils Relative: 2 %
HCT: 43.1 % (ref 38.5–50.0)
Hemoglobin: 14.1 g/dL (ref 13.2–17.1)
Lymphs Abs: 1742 cells/uL (ref 850–3900)
MCH: 29.7 pg (ref 27.0–33.0)
MCHC: 32.7 g/dL (ref 32.0–36.0)
MCV: 90.9 fL (ref 80.0–100.0)
MPV: 11.3 fL (ref 7.5–12.5)
Monocytes Relative: 8.4 %
Neutro Abs: 4050 cells/uL (ref 1500–7800)
Neutrophils Relative %: 62.3 %
Platelets: 205 10*3/uL (ref 140–400)
RBC: 4.74 10*6/uL (ref 4.20–5.80)
RDW: 12 % (ref 11.0–15.0)
Total Lymphocyte: 26.8 %
WBC: 6.5 10*3/uL (ref 3.8–10.8)

## 2019-07-25 LAB — LIPID PANEL
Cholesterol: 187 mg/dL (ref <200)
HDL: 51 mg/dL (ref >=40)
LDL Cholesterol (Calc): 114 mg/dL (calc) — ABNORMAL HIGH
Non-HDL Cholesterol (Calc): 136 mg/dL (calc) — ABNORMAL HIGH (ref <130)
Total CHOL/HDL Ratio: 3.7 (calc) (ref <5.0)
Triglycerides: 112 mg/dL (ref <150)

## 2019-07-25 LAB — TSH: TSH: 1.91 mIU/L (ref 0.40–4.50)

## 2019-07-25 MED ORDER — ROSUVASTATIN CALCIUM 5 MG PO TABS
5.0000 mg | ORAL_TABLET | Freq: Every day | ORAL | 3 refills | Status: DC
Start: 2019-07-25 — End: 2019-11-27

## 2019-07-25 NOTE — Telephone Encounter (Signed)
Noted  

## 2019-07-25 NOTE — Telephone Encounter (Signed)
Pt was returning Misty's call about results.   Pt can be reached at [phone]

## 2019-07-29 ENCOUNTER — Ambulatory Visit (INDEPENDENT_AMBULATORY_CARE_PROVIDER_SITE_OTHER): Payer: PPO

## 2019-07-29 DIAGNOSIS — J309 Allergic rhinitis, unspecified: Secondary | ICD-10-CM | POA: Diagnosis not present

## 2019-08-06 ENCOUNTER — Other Ambulatory Visit: Payer: Self-pay

## 2019-08-06 ENCOUNTER — Ambulatory Visit (INDEPENDENT_AMBULATORY_CARE_PROVIDER_SITE_OTHER): Payer: PPO

## 2019-08-06 DIAGNOSIS — J309 Allergic rhinitis, unspecified: Secondary | ICD-10-CM

## 2019-08-12 ENCOUNTER — Ambulatory Visit (INDEPENDENT_AMBULATORY_CARE_PROVIDER_SITE_OTHER): Payer: PPO

## 2019-08-12 DIAGNOSIS — J309 Allergic rhinitis, unspecified: Secondary | ICD-10-CM | POA: Diagnosis not present

## 2019-08-19 ENCOUNTER — Ambulatory Visit (INDEPENDENT_AMBULATORY_CARE_PROVIDER_SITE_OTHER): Payer: PPO | Admitting: *Deleted

## 2019-08-19 DIAGNOSIS — J309 Allergic rhinitis, unspecified: Secondary | ICD-10-CM

## 2019-08-26 ENCOUNTER — Ambulatory Visit (INDEPENDENT_AMBULATORY_CARE_PROVIDER_SITE_OTHER): Payer: PPO

## 2019-08-26 DIAGNOSIS — J309 Allergic rhinitis, unspecified: Secondary | ICD-10-CM | POA: Diagnosis not present

## 2019-08-28 DIAGNOSIS — J3089 Other allergic rhinitis: Secondary | ICD-10-CM | POA: Diagnosis not present

## 2019-08-28 NOTE — Progress Notes (Signed)
VIALS EXP 08-27-20

## 2019-08-29 DIAGNOSIS — J3081 Allergic rhinitis due to animal (cat) (dog) hair and dander: Secondary | ICD-10-CM | POA: Diagnosis not present

## 2019-09-02 ENCOUNTER — Other Ambulatory Visit: Payer: Self-pay | Admitting: *Deleted

## 2019-09-02 ENCOUNTER — Telehealth: Payer: Self-pay

## 2019-09-02 MED ORDER — AZELASTINE HCL 0.1 % NA SOLN: 1.0000 | Freq: Two times a day (BID) | NASAL | 5 refills | Status: DC | PRN

## 2019-09-02 NOTE — Telephone Encounter (Signed)
Noted.  I agree.  Thanks 

## 2019-09-02 NOTE — Telephone Encounter (Signed)
Patient called to discuss his allergies. He has been having some issues and would like to talk to a nurse regarding them.  Please Advise.

## 2019-09-02 NOTE — Telephone Encounter (Signed)
Called and spoke with the patient and he stated that last Thursday he started to experience sinus congestion and sinus headache and feeling of congestion in his chest. He stated that on Friday he had a fever and cold chills. He states that today he does feel some better but that he still feels under the weather. I did advise that the patient go and receive a COVID test to be safe and to help avoid the spread if he were to be positive. Patient stated that he is vaccinated. I did advise to patient that he can still get COVID but that the symptoms are less severe and that in order to not spread this to others I did recommend that the patient get COVID tested. Patient verbalized understanding and is looking to get tested at either [facility] or [facility]. He has not been using his azelastine nasal spray because he ran out of his prescription. I advised that I would send in a new prescription but that I wanted him to also get COVID testing first before going to pick up the medication and possibly spreading germs. Patient verbalized understanding. Please advise anything further.

## 2019-09-03 DIAGNOSIS — Z20828 Contact with and (suspected) exposure to other viral communicable diseases: Secondary | ICD-10-CM | POA: Diagnosis not present

## 2019-09-06 ENCOUNTER — Telehealth: Payer: Self-pay | Admitting: Allergy and Immunology

## 2019-09-06 NOTE — Telephone Encounter (Signed)
Pt was tested last Tuesday to covid and it was negative

## 2019-09-06 NOTE — Telephone Encounter (Signed)
Cough has gotten worse. He is unable to sleep. He has a lot of drainage in his throat and that's causing the coughing. Pt is not taking any antihistamine. Pt is doing saline, and nasal spray, 1 600mg  mucinexDM twice daily. No fever, no body aches no chills

## 2019-09-06 NOTE — Telephone Encounter (Signed)
Patient is using azelastine and he said it is not making him better. He also takes Mucinex DM and that is not helping. He said he was awake all night with drainage. He would like to know if there is anything else that would help.

## 2019-09-06 NOTE — Telephone Encounter (Signed)
Noted. He is scheduled for OV next Monday.

## 2019-09-06 NOTE — Telephone Encounter (Signed)
Please call patient back.  I don't see an OV since March.   He needs a visit to assess this coughing to see what's contributing to his coughing.   Did he get his COVID testing done?

## 2019-09-09 ENCOUNTER — Other Ambulatory Visit: Payer: Self-pay

## 2019-09-09 ENCOUNTER — Ambulatory Visit (INDEPENDENT_AMBULATORY_CARE_PROVIDER_SITE_OTHER): Payer: PPO | Admitting: Allergy and Immunology

## 2019-09-09 ENCOUNTER — Encounter: Payer: Self-pay | Admitting: Allergy and Immunology

## 2019-09-09 VITALS — BP 130/70 | HR 67 | Temp 98.4°F | Resp 16 | Ht 70.0 in | Wt 240.8 lb

## 2019-09-09 DIAGNOSIS — R05 Cough: Secondary | ICD-10-CM | POA: Diagnosis not present

## 2019-09-09 DIAGNOSIS — R059 Cough, unspecified: Secondary | ICD-10-CM

## 2019-09-09 DIAGNOSIS — K219 Gastro-esophageal reflux disease without esophagitis: Secondary | ICD-10-CM

## 2019-09-09 DIAGNOSIS — J3089 Other allergic rhinitis: Secondary | ICD-10-CM | POA: Diagnosis not present

## 2019-09-09 MED ORDER — AZELASTINE-FLUTICASONE 137-50 MCG/ACT NA SUSP: 1.0000 | Freq: Two times a day (BID) | NASAL | 5 refills | Status: DC | PRN

## 2019-09-09 MED ORDER — HYDROCOD POLST-CPM POLST ER 10-8 MG/5ML PO SUER
ORAL | 0 refills | Status: DC
Start: 2019-09-09 — End: 2019-11-27

## 2019-09-09 MED ORDER — OMEPRAZOLE 20 MG PO TBDD: DELAYED_RELEASE_TABLET | ORAL | 5 refills | Status: DC

## 2019-09-09 NOTE — Assessment & Plan Note (Signed)
The history suggests upper airway cough syndrome with probable contribution from acid reflux.  Prednisone has been provided, 40 mg x3 days, 20 mg x1 day, 10 mg x1 day, then stop.  A prescription has been provided for Tussionex ER suspension, 5 mL every 12 hours over the next 3 days, then stop.  Treatment plan as outlined below.

## 2019-09-09 NOTE — Progress Notes (Signed)
Follow-up Note  RE: Marcus Gomez. MRN: [id number] DOB: 1945/12/28 Date of Office Visit: 09/09/2019  Primary care provider: [name], NP Referring provider: [name], NP  History of present illness: Marcus Gomez" Marcus Gomez. is a 74 y.o. male with allergic rhinoconjunctivitis, gastroesophageal reflux, and history of persistent coughing presenting today for sick visit.  He was last seen in this clinic on April 01, 2019.  He reports that over the past 10 days he has had a severe, persistent cough and that, at times, he has coughed to the point of vomiting.  The cough is disruptive to sleep.  The past few nights he has been been better able to sleep while in a recliner.  The cough originates as a tickle in the base of his throat and he believes that it may be related to thick postnasal drainage.  He reports that he may have felt slightly feverish at the very onset of the cough, however since then denies fevers, chills, and discolored mucus production.  He was Covid tested 6 days ago with a negative result.  He has occasional heartburn which he treats with famotidine as needed.   Assessment and plan: Coughing The history suggests upper airway cough syndrome with probable contribution from acid reflux.  Prednisone has been provided, 40 mg x3 days, 20 mg x1 day, 10 mg x1 day, then stop.  A prescription has been provided for Tussionex ER suspension, 5 mL every 12 hours over the next 3 days, then stop.  Treatment plan as outlined below.    Perennial and seasonal allergic rhinitis  Continue appropriate allergen avoidance measures.  A prescription has been provided for azelastine/fluticasone nasal spray, 1 spray per nostril twice daily as needed. Proper nasal spray technique has been discussed and demonstrated.  Nasal saline lavage (NeilMed) has been recommended as needed and prior to medicated nasal sprays along with instructions for proper administration.  For  thick post nasal drainage, add guaifenesin [phone] mg (Mucinex)  twice daily as needed with adequate hydration as discussed.  Resume immunotherapy injections after cough has subsided.  GERD (gastroesophageal reflux disease)  Continue appropriate reflux lifestyle modifications.  A prescription has been provided for omeprazole (Prilosec) 20 mg daily, 30 minutes prior to breakfast.  I have recommended taking famotidine (Pepcid) 20 mg twice daily for now.   Meds ordered this encounter  Medications  . chlorpheniramine-HYDROcodone (TUSSIONEX PENNKINETIC ER) 10-8 MG/5ML SUER    Sig: 34ml every 12 hours over the next 3 days    Dispense:  40 mL    Refill:  0  . Azelastine-Fluticasone 137-50 MCG/ACT SUSP    Sig: Place 1 spray into both nostrils 2 (two) times daily as needed.    Dispense:  25 g    Refill:  5  . Omeprazole 20 MG TBDD    Sig: 1 30 minutes before breakfast    Dispense:  30 tablet    Refill:  5    Diagnostics: Spirometry reveals an FVC of 3.90 L and an FEV1 of 3.30 L (106% predicted) without significant postbronchodilator improvement.  Please see scanned spirometry results for details.    Physical examination: Blood pressure 130/70, pulse 67, temperature 98.4 F (36.9 C), temperature source Oral, resp. rate 16, height 5\' 10"  (1.778 m), weight 240 lb 12.8 oz (109.2 kg), SpO2 98 %.  General: Alert, interactive, in no acute distress. HEENT: TMs pearly gray, turbinates moderately edematous with thick discharge, post-pharynx moderately erythematous. Neck: Supple without lymphadenopathy. Lungs: Clear  to auscultation without wheezing, rhonchi or rales. CV: Normal S1, S2 without murmurs. Skin: Warm and dry, without lesions or rashes.  The following portions of the patient's history were reviewed and updated as appropriate: allergies, current medications, past family history, past medical history, past social history, past surgical history and problem list.  Current Outpatient  Medications  Medication Sig Dispense Refill  . azelastine (ASTELIN) 0.1 % nasal spray Place 1 spray into both nostrils 2 (two) times daily as needed for rhinitis. 30 mL 5  . EPINEPHrine 0.3 mg/0.3 mL IJ SOAJ injection Inject 0.3 mLs (0.3 mg total) into the muscle once as needed for up to 1 dose for anaphylaxis. 0.3 mL 1  . losartan (COZAAR) 50 MG tablet Take 1 tablet (50 mg total) by mouth daily. 90 tablet 3  . Multiple Vitamins-Minerals (ONE-A-DAY MENS 50+ PO) Take by mouth.    . Multiple Vitamins-Minerals (PRESERVISION AREDS 2+MULTI VIT PO) Take by mouth.    . Olopatadine HCl (PATADAY) 0.2 % SOLN Place 1 drop into both eyes 1 day or 1 dose. 1 Bottle 5  . Turmeric (QC TUMERIC COMPLEX PO) Take by mouth.    . Azelastine-Fluticasone 137-50 MCG/ACT SUSP Place 1 spray into both nostrils 2 (two) times daily as needed. 25 g 5  . chlorpheniramine-HYDROcodone (TUSSIONEX PENNKINETIC ER) 10-8 MG/5ML SUER 83ml every 12 hours over the next 3 days 40 mL 0  . ipratropium (ATROVENT) 0.06 % nasal spray Place 2 sprays into both nostrils 3 (three) times daily. (Patient not taking: Reported on 09/09/2019) 15 mL 5  . Omeprazole 20 MG TBDD 1 30 minutes before breakfast 30 tablet 5  . rosuvastatin (CRESTOR) 5 MG tablet Take 1 tablet (5 mg total) by mouth at bedtime. (Patient not taking: Reported on 09/09/2019) 90 tablet 3   No current facility-administered medications for this visit.    Allergies  Allergen Reactions  . Lisinopril Cough   Review of systems: Review of systems negative except as noted in HPI / PMHx.  Past Medical History:  Diagnosis Date  . Essential hypertension   . Prostate cancer ([place])    Remission    Family History  Problem Relation Age of Onset  . Stroke Mother   . Allergies Father   . COPD Father        never smoker  . Rheum arthritis Father   . Allergic rhinitis Neg Hx   . Angioedema Neg Hx   . Asthma Neg Hx   . Eczema Neg Hx   . Immunodeficiency Neg Hx   . Urticaria Neg Hx       Social History   Socioeconomic History  . Marital status: Divorced    Spouse name: Not on file  . Number of children: Not on file  . Years of education: Not on file  . Highest education level: Not on file  Occupational History  . Not on file  Tobacco Use  . Smoking status: Never Smoker  . Smokeless tobacco: Never Used  Vaping Use  . Vaping Use: Never used  Substance and Sexual Activity  . Alcohol use: Yes    Comment: socially   . Drug use: No  . Sexual activity: Yes  Other Topics Concern  . 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       Social Determinants of Health   Financial Resource Strain:   .  Difficulty of Paying Living Expenses: Not on file  Food Insecurity:   . Worried About Charity fundraiser in the Last Year: Not on file  . Ran Out of Food in the Last Year: Not on file  Transportation Needs:   . Lack of Transportation (Medical): Not on file  . Lack of Transportation (Non-Medical): Not on file  Physical Activity:   . Days of Exercise per Week: Not on file  . Minutes of Exercise per Session: Not on file  Stress:   . Feeling of Stress : Not on file  Social Connections:   . Frequency of Communication with Friends and Family: Not on file  . Frequency of Social Gatherings with Friends and Family: Not on file  . Attends Religious Services: Not on file  . Active Member of Clubs or Organizations: Not on file  . Attends Archivist Meetings: Not on file  . Marital Status: Not on file  Intimate Partner Violence:   . Fear of Current or Ex-Partner: Not on file  . Emotionally Abused: Not on file  . Physically Abused: Not on file  . Sexually Abused: Not on file    I appreciate the opportunity to take part in Debbie's care. Please do not hesitate to contact me with questions.  Sincerely,   R. Edgar Frisk, MD

## 2019-09-09 NOTE — Assessment & Plan Note (Addendum)
   Continue appropriate allergen avoidance measures.  A prescription has been provided for azelastine/fluticasone nasal spray, 1 spray per nostril twice daily as needed. Proper nasal spray technique has been discussed and demonstrated.  Nasal saline lavage (NeilMed) has been recommended as needed and prior to medicated nasal sprays along with instructions for proper administration.  For thick post nasal drainage, add guaifenesin [phone] mg (Mucinex)  twice daily as needed with adequate hydration as discussed.  Resume immunotherapy injections after cough has subsided.

## 2019-09-09 NOTE — Assessment & Plan Note (Addendum)
   Continue appropriate reflux lifestyle modifications.  A prescription has been provided for omeprazole (Prilosec) 20 mg daily, 30 minutes prior to breakfast.  I have recommended taking famotidine (Pepcid) 20 mg twice daily for now.

## 2019-09-09 NOTE — Patient Instructions (Addendum)
Coughing The history suggests upper airway cough syndrome with probable contribution from acid reflux.  Prednisone has been provided, 40 mg x3 days, 20 mg x1 day, 10 mg x1 day, then stop.  A prescription has been provided for Tussionex ER suspension, 5 mL every 12 hours over the next 3 days, then stop.  Treatment plan as outlined below.    Perennial and seasonal allergic rhinitis  Continue appropriate allergen avoidance measures.  A prescription has been provided for azelastine/fluticasone nasal spray, 1 spray per nostril twice daily as needed. Proper nasal spray technique has been discussed and demonstrated.  Nasal saline lavage (NeilMed) has been recommended as needed and prior to medicated nasal sprays along with instructions for proper administration.  For thick post nasal drainage, add guaifenesin [phone] mg (Mucinex)  twice daily as needed with adequate hydration as discussed.  Resume immunotherapy injections after cough has subsided.  GERD (gastroesophageal reflux disease)  Continue appropriate reflux lifestyle modifications.  A prescription has been provided for omeprazole (Prilosec) 20 mg daily, 30 minutes prior to breakfast.  I have recommended taking famotidine (Pepcid) 20 mg twice daily for now.   Return in about 3 months (around 12/10/2019), or if symptoms worsen or fail to improve.

## 2019-09-12 ENCOUNTER — Telehealth: Payer: Self-pay | Admitting: Adult Health

## 2019-09-12 NOTE — Telephone Encounter (Signed)
Pt stated the rosuvastatin is making him sick-the longest he could take it was three days and it would make him feel sick on his stomach. He would like to know what his PCP wants to do?   Pt would like a call back at [phone]

## 2019-09-13 NOTE — Telephone Encounter (Signed)
Have him take it twice a week on Monday and Friday

## 2019-09-13 NOTE — Telephone Encounter (Signed)
I spoke with patient. We went over the information below & he verbalized understanding.

## 2019-09-17 ENCOUNTER — Ambulatory Visit (INDEPENDENT_AMBULATORY_CARE_PROVIDER_SITE_OTHER): Payer: PPO

## 2019-09-17 DIAGNOSIS — J309 Allergic rhinitis, unspecified: Secondary | ICD-10-CM | POA: Diagnosis not present

## 2019-09-23 ENCOUNTER — Ambulatory Visit (INDEPENDENT_AMBULATORY_CARE_PROVIDER_SITE_OTHER): Payer: PPO

## 2019-09-23 DIAGNOSIS — J309 Allergic rhinitis, unspecified: Secondary | ICD-10-CM | POA: Diagnosis not present

## 2019-10-01 ENCOUNTER — Ambulatory Visit (INDEPENDENT_AMBULATORY_CARE_PROVIDER_SITE_OTHER): Payer: PPO | Admitting: *Deleted

## 2019-10-01 DIAGNOSIS — J309 Allergic rhinitis, unspecified: Secondary | ICD-10-CM

## 2019-10-08 ENCOUNTER — Ambulatory Visit (INDEPENDENT_AMBULATORY_CARE_PROVIDER_SITE_OTHER): Payer: PPO | Admitting: *Deleted

## 2019-10-08 DIAGNOSIS — J309 Allergic rhinitis, unspecified: Secondary | ICD-10-CM | POA: Diagnosis not present

## 2019-10-14 DIAGNOSIS — M19042 Primary osteoarthritis, left hand: Secondary | ICD-10-CM | POA: Diagnosis not present

## 2019-10-14 DIAGNOSIS — M79642 Pain in left hand: Secondary | ICD-10-CM | POA: Diagnosis not present

## 2019-10-14 DIAGNOSIS — M25442 Effusion, left hand: Secondary | ICD-10-CM | POA: Diagnosis not present

## 2019-10-15 ENCOUNTER — Ambulatory Visit (INDEPENDENT_AMBULATORY_CARE_PROVIDER_SITE_OTHER): Payer: PPO | Admitting: *Deleted

## 2019-10-15 DIAGNOSIS — J309 Allergic rhinitis, unspecified: Secondary | ICD-10-CM

## 2019-10-21 ENCOUNTER — Ambulatory Visit (INDEPENDENT_AMBULATORY_CARE_PROVIDER_SITE_OTHER): Payer: PPO

## 2019-10-21 DIAGNOSIS — J309 Allergic rhinitis, unspecified: Secondary | ICD-10-CM

## 2019-10-28 ENCOUNTER — Ambulatory Visit (INDEPENDENT_AMBULATORY_CARE_PROVIDER_SITE_OTHER): Payer: PPO

## 2019-10-28 DIAGNOSIS — J309 Allergic rhinitis, unspecified: Secondary | ICD-10-CM

## 2019-10-30 DIAGNOSIS — M19042 Primary osteoarthritis, left hand: Secondary | ICD-10-CM | POA: Diagnosis not present

## 2019-11-11 ENCOUNTER — Ambulatory Visit (INDEPENDENT_AMBULATORY_CARE_PROVIDER_SITE_OTHER): Payer: PPO

## 2019-11-11 DIAGNOSIS — J309 Allergic rhinitis, unspecified: Secondary | ICD-10-CM

## 2019-11-25 ENCOUNTER — Ambulatory Visit (INDEPENDENT_AMBULATORY_CARE_PROVIDER_SITE_OTHER): Payer: PPO

## 2019-11-25 DIAGNOSIS — J309 Allergic rhinitis, unspecified: Secondary | ICD-10-CM

## 2019-11-26 NOTE — Progress Notes (Signed)
Follow Up Note  RE: Marcus Gomez. MRN: [id number] DOB: 12-14-1945 Date of Office Visit: 11/27/2019  Referring provider: [name], NP Primary care provider: [name], NP  Chief Complaint: Follow-up, Allergic Rhinitis , and Cough  History of Present Illness: I had the pleasure of seeing Marcus Gomez for a follow up visit at the [facility] on 11/27/2019. He is a 74 y.o. male, who is being followed for coughing, allergic rhinitis on AIT and GERD. His previous allergy office visit was on 09/09/2019 with Dr. Verlin Fester. Today is a regular follow up visit.  Coughing Coughing improved after taking the prednisone and Tussionex but now having another flare which started about 2.5 weeks ago. Worse in the mornings and at night. Sometimes coughing so much at times that he has post tussive emesis episodes.   No history of asthma. No wheezing, shortness of breath, chest tightness.   Denies any fevers or chills. No prior asthma diagnosis or inhaler use.   Perennial and seasonal allergic rhinitis 2019 skin testing was positive to grass, trees, mold, cat, dog, cockroach and dust mites. Patient does have some PND. Currently on Astelin nasal spray 2 sprays per nostril three times a day with some benefit.  Patient took an Allegra this morning due to nasal congestion. Taking Mucinex in the morning and at night with some benefit. Noted that since he went to less frequent allergy injections the symptoms seemed to be worse. His allergies always flare in the fall when he has a dog show.   GERD (gastroesophageal reflux disease) Currently on Prilosec 20mg  daily only in the mornings. Did not try famotidine.  Denies any significant reflux symptoms.   10/22/2018 CXR: IMPRESSION: No acute abnormality of the lungs.  Assessment and Plan: Marcus Gomez is a 74 y.o. male with: Coughing Coughing improved initially after prednisone and Tussionex.  However now  having another flare which started 2.5 weeks ago.  Worse in the mornings and at night.  Having posttussive emesis at times.  No prior diagnosis of asthma or albuterol use.  Denies any significant reflux symptoms.  Discussed with patient that coughing can be multifactorial in nature. In his case I'm concerned if PND and GERD may be contributing to it.    Today's spirometry was normal with no improvement in FEV1 post bronchodilator treatment. Clinically feeling the same.   Get chest X-ray in 1-2 weeks if symptoms not improving. Last X-ray was over 1 year ago.   INCREASE omeprazole to 40mg  daily.  START ipratropium nasal spray 1-2 sprays per nostril twice a day as needed for runny nose/drainage.  STOP azelastine nasal spray (white bottle). Start Singulair (montelukast) 10mg  daily at night. See if it helps with your coughing.  Cautioned that in some children/adults can experience behavioral changes including hyperactivity, agitation, depression, sleep disturbances and suicidal ideations. These side effects are rare, but if you notice them you should notify me and discontinue Singulair (montelukast).  Perennial and seasonal allergic rhinitis Past history - 2019 skin testing was positive to grass, trees, mold, cat, dog, cockroach and dust mites. Started AIT on 11/12/2018 (G-T-DM-D & M-C-CR). Interim history - Increased nasal symptoms. Only using azelastine right now. Noticed worsening symptoms after dog show every fall. Seems like every 2 weeks injections not helping as much as weekly injections.  Continue environmental control measures.  Will change allergy shots to once a week instead of every 2 weeks.  START Flonase (fluticasone) nasal spray 1 spray per nostril twice  a day as needed for nasal congestion.   Take a daily over the counter antihistamines such as Zyrtec (cetirizine), Claritin (loratadine), Allegra (fexofenadine), or Xyzal (levocetirizine) once a day.   Start ipratropium nasal  spray and Singulair as above.   GERD (gastroesophageal reflux disease) Did not try famotidine.  Continue appropriate reflux lifestyle modifications.  INCREASE omeprazole (Prilosec) to 40mg  daily, 30 minutes prior to breakfast.  Return in about 2 months (around 01/27/2020).  Meds ordered this encounter  Medications  . fluticasone (FLONASE) 50 MCG/ACT nasal spray    Sig: Place 1 spray into both nostrils in the morning and at bedtime. For nasal congestion    Dispense:  16 g    Refill:  5  . ipratropium (ATROVENT) 0.03 % nasal spray    Sig: Place 2 sprays into both nostrils every 12 (twelve) hours. For drainage    Dispense:  30 mL    Refill:  5  . montelukast (SINGULAIR) 10 MG tablet    Sig: Take 1 tablet (10 mg total) by mouth at bedtime.    Dispense:  30 tablet    Refill:  5  . omeprazole (PRILOSEC) 40 MG capsule    Sig: Take 40mg  daily 30 minutes before breakfast.    Dispense:  30 capsule    Refill:  5   Diagnostics: Spirometry:  Tracings reviewed. His effort: Good reproducible efforts. FVC: 4.05L FEV1: 3.21L, 103% predicted FEV1/FVC ratio: 79% Interpretation: Spirometry consistent with normal pattern with no improvement in FEV1 post bronchodilator treatment. Clinically feeling the same.  Please see scanned spirometry results for details.  Medication List:  Current Outpatient Medications  Medication Sig Dispense Refill  . EPINEPHrine 0.3 mg/0.3 mL IJ SOAJ injection Inject 0.3 mLs (0.3 mg total) into the muscle once as needed for up to 1 dose for anaphylaxis. 0.3 mL 1  . losartan (COZAAR) 50 MG tablet Take 1 tablet (50 mg total) by mouth daily. 90 tablet 3  . Multiple Vitamins-Minerals (ONE-A-DAY MENS 50+ PO) Take by mouth.    . Multiple Vitamins-Minerals (PRESERVISION AREDS 2+MULTI VIT PO) Take by mouth.    . Olopatadine HCl (PATADAY) 0.2 % SOLN Place 1 drop into both eyes 1 day or 1 dose. 1 Bottle 5  . Turmeric (QC TUMERIC COMPLEX PO) Take by mouth.    . fluticasone  (FLONASE) 50 MCG/ACT nasal spray Place 1 spray into both nostrils in the morning and at bedtime. For nasal congestion 16 g 5  . ipratropium (ATROVENT) 0.03 % nasal spray Place 2 sprays into both nostrils every 12 (twelve) hours. For drainage 30 mL 5  . montelukast (SINGULAIR) 10 MG tablet Take 1 tablet (10 mg total) by mouth at bedtime. 30 tablet 5  . omeprazole (PRILOSEC) 40 MG capsule Take 40mg  daily 30 minutes before breakfast. 30 capsule 5   No current facility-administered medications for this visit.   Allergies: Allergies  Allergen Reactions  . Lisinopril Cough   I reviewed his past medical history, social history, family history, and environmental history and no significant changes have been reported from his previous visit.  Review of Systems  Constitutional: Negative for appetite change, chills, fever and unexpected weight change.  HENT: Positive for congestion and postnasal drip. Negative for rhinorrhea.   Eyes: Negative for itching.  Respiratory: Positive for cough. Negative for chest tightness, shortness of breath and wheezing.   Gastrointestinal: Negative for abdominal pain.  Skin: Negative for rash.  Allergic/Immunologic: Positive for environmental allergies.  Neurological: Negative for headaches.  Objective: BP 122/82   Pulse 65   Temp 98.1 F (36.7 C) (Temporal)   Resp 16   SpO2 98%  There is no height or weight on file to calculate BMI. Physical Exam Vitals and nursing note reviewed.  Constitutional:      Appearance: Normal appearance. He is well-developed.  HENT:     Head: Normocephalic and atraumatic.     Right Ear: Tympanic membrane and external ear normal.     Left Ear: Tympanic membrane and external ear normal.     Nose: Nose normal.     Mouth/Throat:     Mouth: Mucous membranes are moist.     Pharynx: Oropharynx is clear.  Eyes:     Conjunctiva/sclera: Conjunctivae normal.  Cardiovascular:     Rate and Rhythm: Normal rate and regular rhythm.      Heart sounds: Normal heart sounds. No murmur heard.   Pulmonary:     Effort: Pulmonary effort is normal.     Breath sounds: Normal breath sounds. No wheezing, rhonchi or rales.  Musculoskeletal:     Cervical back: Neck supple.  Skin:    General: Skin is warm.     Findings: No rash.  Neurological:     Mental Status: He is alert and oriented to person, place, and time.  Psychiatric:        Behavior: Behavior normal.    Previous notes and tests were reviewed. The plan was reviewed with the patient/family, and all questions/concerned were addressed.  It was my pleasure to see Marcus Gomez today and participate in his care. Please feel free to contact me with any questions or concerns.  Sincerely,  Rexene Alberts, DO Allergy & Immunology  [facility] office: [place] office: [phone]

## 2019-11-27 ENCOUNTER — Encounter: Payer: Self-pay | Admitting: Allergy

## 2019-11-27 ENCOUNTER — Other Ambulatory Visit: Payer: Self-pay

## 2019-11-27 ENCOUNTER — Ambulatory Visit (INDEPENDENT_AMBULATORY_CARE_PROVIDER_SITE_OTHER): Payer: PPO | Admitting: Allergy

## 2019-11-27 VITALS — BP 122/82 | HR 65 | Temp 98.1°F | Resp 16

## 2019-11-27 DIAGNOSIS — K219 Gastro-esophageal reflux disease without esophagitis: Secondary | ICD-10-CM | POA: Diagnosis not present

## 2019-11-27 DIAGNOSIS — J3089 Other allergic rhinitis: Secondary | ICD-10-CM

## 2019-11-27 DIAGNOSIS — R059 Cough, unspecified: Secondary | ICD-10-CM | POA: Diagnosis not present

## 2019-11-27 MED ORDER — FLUTICASONE PROPIONATE 50 MCG/ACT NA SUSP: 1.0000 | Freq: Two times a day (BID) | NASAL | 5 refills | Status: DC

## 2019-11-27 MED ORDER — OMEPRAZOLE 40 MG PO CPDR: DELAYED_RELEASE_CAPSULE | ORAL | 5 refills | Status: DC

## 2019-11-27 MED ORDER — IPRATROPIUM BROMIDE 0.03 % NA SOLN: 2.0000 | Freq: Two times a day (BID) | NASAL | 5 refills | Status: DC

## 2019-11-27 MED ORDER — MONTELUKAST SODIUM 10 MG PO TABS: 10.0000 mg | ORAL_TABLET | Freq: Every day | ORAL | 5 refills | Status: DC

## 2019-11-27 NOTE — Assessment & Plan Note (Signed)
Coughing improved initially after prednisone and Tussionex.  However now having another flare which started 2.5 weeks ago.  Worse in the mornings and at night.  Having posttussive emesis at times.  No prior diagnosis of asthma or albuterol use.  Denies any significant reflux symptoms.  Discussed with patient that coughing can be multifactorial in nature. In his case I'm concerned if PND and GERD may be contributing to it.    Today's spirometry was normal with no improvement in FEV1 post bronchodilator treatment. Clinically feeling the same.   Get chest X-ray in 1-2 weeks if symptoms not improving. Last X-ray was over 1 year ago.   INCREASE omeprazole to 40mg  daily.  START ipratropium nasal spray 1-2 sprays per nostril twice a day as needed for runny nose/drainage.  STOP azelastine nasal spray (white bottle). Start Singulair (montelukast) 10mg  daily at night. See if it helps with your coughing.  Cautioned that in some children/adults can experience behavioral changes including hyperactivity, agitation, depression, sleep disturbances and suicidal ideations. These side effects are rare, but if you notice them you should notify me and discontinue Singulair (montelukast).

## 2019-11-27 NOTE — Patient Instructions (Addendum)
Coughing:  Get chest X-ray in 1-2 weeks if symptoms not improving.  Coughing can be multifactorial in nature.   START ipratropium nasal spray 1-2 sprays per nostril twice a day as needed for runny nose/drainage.  STOP azelastine nasal spray (white bottle). Start Singulair (montelukast) 10mg  daily at night - this is good for allergies. See if it helps with your coughing.  Cautioned that in some children/adults can experience behavioral changes including hyperactivity, agitation, depression, sleep disturbances and suicidal ideations. These side effects are rare, but if you notice them you should notify me and discontinue Singulair (montelukast).  Perennial and seasonal allergic rhinitis  2019 skin testing was positive to grass, trees, mold, cat, dog, cockroach and dust mites.  Continue environmental control measures.  Will change allergy shots to once a week instead of every 2 weeks.  START Flonase (fluticasone) nasal spray 1 spray per nostril twice a day as needed for nasal congestion.   Take a daily over the counter antihistamines such as Zyrtec (cetirizine), Claritin (loratadine), Allegra (fexofenadine), or Xyzal (levocetirizine) once a day.   GERD (gastroesophageal reflux disease)  Continue appropriate reflux lifestyle modifications.  INCREASE omeprazole (Prilosec) to 40mg  daily, 30 minutes prior to breakfast.  Follow up in 2 months or sooner if needed.  Reducing Pollen Exposure . Pollen seasons: trees (spring), grass (summer) and ragweed/weeds (fall). Marland Kitchen Keep windows closed in your home and car to lower pollen exposure.  Susa Simmonds air conditioning in the bedroom and throughout the house if possible.  . Avoid going out in dry windy days - especially early morning. . Pollen counts are highest between 5 - 10 AM and on dry, hot and windy days.  . Save outside activities for late afternoon or after a heavy rain, when pollen levels are lower.  . Avoid mowing of grass if you have  grass pollen allergy. Marland Kitchen Be aware that pollen can also be transported indoors on people and pets.  . Dry your clothes in an automatic dryer rather than hanging them outside where they might collect pollen.  . Rinse hair and eyes before bedtime. Mold Control . Mold and fungi can grow on a variety of surfaces provided certain temperature and moisture conditions exist.  . Outdoor molds grow on plants, decaying vegetation and soil. The major outdoor mold, Alternaria and Cladosporium, are found in very high numbers during hot and dry conditions. Generally, a late summer - fall peak is seen for common outdoor fungal spores. Rain will temporarily lower outdoor mold spore count, but counts rise rapidly when the rainy period ends. . The most important indoor molds are Aspergillus and Penicillium. Dark, humid and poorly ventilated basements are ideal sites for mold growth. The next most common sites of mold growth are the bathroom and the kitchen. Outdoor (Seasonal) Mold Control . Use air conditioning and keep windows closed. . Avoid exposure to decaying vegetation. Marland Kitchen Avoid leaf raking. . Avoid grain handling. . Consider wearing a face mask if working in moldy areas.  Indoor (Perennial) Mold Control  . Maintain humidity below 50%. . Get rid of mold growth on hard surfaces with water, detergent and, if necessary, 5% bleach (do not mix with other cleaners). Then dry the area completely. If mold covers an area more than 10 square feet, consider hiring an indoor environmental professional. . For clothing, washing with soap and water is best. If moldy items cannot be cleaned and dried, throw them away. . Remove sources e.g. contaminated carpets. . Repair and seal leaking  roofs or pipes. Using dehumidifiers in damp basements may be helpful, but empty the water and clean units regularly to prevent mildew from forming. All rooms, especially basements, bathrooms and kitchens, require ventilation and cleaning to deter  mold and mildew growth. Avoid carpeting on concrete or damp floors, and storing items in damp areas. Pet Allergen Avoidance: . Contrary to popular opinion, there are no "hypoallergenic" breeds of dogs or cats. That is because people are not allergic to an animal's hair, but to an allergen found in the animal's saliva, dander (dead skin flakes) or urine. Pet allergy symptoms typically occur within minutes. For some people, symptoms can build up and become most severe 8 to 12 hours after contact with the animal. People with severe allergies can experience reactions in public places if dander has been transported on the pet owners' clothing. Marland Kitchen Keeping an animal outdoors is only a partial solution, since homes with pets in the yard still have higher concentrations of animal allergens. . Before getting a pet, ask your allergist to determine if you are allergic to animals. If your pet is already considered part of your family, try to minimize contact and keep the pet out of the bedroom and other rooms where you spend a great deal of time. . As with dust mites, vacuum carpets often or replace carpet with a hardwood floor, tile or linoleum. . High-efficiency particulate air (HEPA) cleaners can reduce allergen levels over time. . While dander and saliva are the source of cat and dog allergens, urine is the source of allergens from rabbits, hamsters, mice and [place] pigs; so ask a non-allergic family member to clean the animal's cage. . If you have a pet allergy, talk to your allergist about the potential for allergy immunotherapy (allergy shots). This strategy can often provide long-term relief. Cockroach Allergen Avoidance Cockroaches are often found in the homes of densely populated urban areas, schools or commercial buildings, but these creatures can lurk almost anywhere. This does not mean that you have a dirty house or living area. . Block all areas where roaches can enter the home. This includes crevices,  wall cracks and windows.  . Cockroaches need water to survive, so fix and seal all leaky faucets and pipes. Have an exterminator go through the house when your family and pets are gone to eliminate any remaining roaches. Marland Kitchen Keep food in lidded containers and put pet food dishes away after your pets are done eating. Vacuum and sweep the floor after meals, and take out garbage and recyclables. Use lidded garbage containers in the kitchen. Wash dishes immediately after use and clean under stoves, refrigerators or toasters where crumbs can accumulate. Wipe off the stove and other kitchen surfaces and cupboards regularly. Control of House Dust Mite Allergen . Dust mite allergens are a common trigger of allergy and asthma symptoms. While they can be found throughout the house, these microscopic creatures thrive in warm, humid environments such as bedding, upholstered furniture and carpeting. . Because so much time is spent in the bedroom, it is essential to reduce mite levels there.  . Encase pillows, mattresses, and box springs in special allergen-proof fabric covers or airtight, zippered plastic covers.  . Bedding should be washed weekly in hot water (130 F) and dried in a hot dryer. Allergen-proof covers are available for comforters and pillows that can't be regularly washed.  Wendee Copp the allergy-proof covers every few months. Minimize clutter in the bedroom. Keep pets out of the bedroom.  Marland Kitchen Keep  humidity less than 50% by using a dehumidifier or air conditioning. You can buy a humidity measuring device called a hygrometer to monitor this.  . If possible, replace carpets with hardwood, linoleum, or washable area rugs. If that's not possible, vacuum frequently with a vacuum that has a HEPA filter. . Remove all upholstered furniture and non-washable window drapes from the bedroom. . Remove all non-washable stuffed toys from the bedroom.  Wash stuffed toys weekly.  Gastroesophageal Reflux Disease,  Adult Gastroesophageal reflux (GER) happens when acid from the stomach flows up into the tube that connects the mouth and the stomach (esophagus). Normally, food travels down the esophagus and stays in the stomach to be digested. With GER, food and stomach acid sometimes move back up into the esophagus. You may have a disease called gastroesophageal reflux disease (GERD) if the reflux:  Happens often.  Causes frequent or very bad symptoms.  Causes problems such as damage to the esophagus. When this happens, the esophagus becomes sore and swollen (inflamed). Over time, GERD can make small holes (ulcers) in the lining of the esophagus. What are the causes? This condition is caused by a problem with the muscle between the esophagus and the stomach. When this muscle is weak or not normal, it does not close properly to keep food and acid from coming back up from the stomach. The muscle can be weak because of:  Tobacco use.  Pregnancy.  Having a certain type of hernia (hiatal hernia).  Alcohol use.  Certain foods and drinks, such as coffee, chocolate, onions, and peppermint. What increases the risk? You are more likely to develop this condition if you:  Are overweight.  Have a disease that affects your connective tissue.  Use NSAID medicines. What are the signs or symptoms? Symptoms of this condition include:  Heartburn.  Difficult or painful swallowing.  The feeling of having a lump in the throat.  A bitter taste in the mouth.  Bad breath.  Having a lot of saliva.  Having an upset or bloated stomach.  Belching.  Chest pain. Different conditions can cause chest pain. Make sure you see your doctor if you have chest pain.  Shortness of breath or noisy breathing (wheezing).  Ongoing (chronic) cough or a cough at night.  Wearing away of the surface of teeth (tooth enamel).  Weight loss. How is this treated? Treatment will depend on how bad your symptoms are. Your  doctor may suggest:  Changes to your diet.  Medicine.  Surgery. Follow these instructions at home: Eating and drinking   Follow a diet as told by your doctor. You may need to avoid foods and drinks such as: ? Coffee and tea (with or without caffeine). ? Drinks that contain alcohol. ? Energy drinks and sports drinks. ? Bubbly (carbonated) drinks or sodas. ? Chocolate and cocoa. ? Peppermint and mint flavorings. ? Garlic and onions. ? Horseradish. ? Spicy and acidic foods. These include peppers, chili powder, curry powder, vinegar, hot sauces, and BBQ sauce. ? Citrus fruit juices and citrus fruits, such as oranges, lemons, and limes. ? Tomato-based foods. These include red sauce, chili, salsa, and pizza with red sauce. ? Fried and fatty foods. These include donuts, french fries, potato chips, and high-fat dressings. ? High-fat meats. These include hot dogs, rib eye steak, sausage, ham, and bacon. ? High-fat dairy items, such as whole milk, butter, and cream cheese.  Eat small meals often. Avoid eating large meals.  Avoid drinking large amounts of liquid with  your meals.  Avoid eating meals during the 2-3 hours before bedtime.  Avoid lying down right after you eat.  Do not exercise right after you eat. Lifestyle   Do not use any products that contain nicotine or tobacco. These include cigarettes, e-cigarettes, and chewing tobacco. If you need help quitting, ask your doctor.  Try to lower your stress. If you need help doing this, ask your doctor.  If you are overweight, lose an amount of weight that is healthy for you. Ask your doctor about a safe weight loss goal. General instructions  Pay attention to any changes in your symptoms.  Take over-the-counter and prescription medicines only as told by your doctor. Do not take aspirin, ibuprofen, or other NSAIDs unless your doctor says it is okay.  Wear loose clothes. Do not wear anything tight around your waist.  Raise  (elevate) the head of your bed about 6 inches (15 cm).  Avoid bending over if this makes your symptoms worse.  Keep all follow-up visits as told by your doctor. This is important. Contact a doctor if:  You have new symptoms.  You lose weight and you do not know why.  You have trouble swallowing or it hurts to swallow.  You have wheezing or a cough that keeps happening.  Your symptoms do not get better with treatment.  You have a hoarse voice. Get help right away if:  You have pain in your arms, neck, jaw, teeth, or back.  You feel sweaty, dizzy, or light-headed.  You have chest pain or shortness of breath.  You throw up (vomit) and your throw-up looks like blood or coffee grounds.  You pass out (faint).  Your poop (stool) is bloody or black.  You cannot swallow, drink, or eat. Summary  If a person has gastroesophageal reflux disease (GERD), food and stomach acid move back up into the esophagus and cause symptoms or problems such as damage to the esophagus.  Treatment will depend on how bad your symptoms are.  Follow a diet as told by your doctor.  Take all medicines only as told by your doctor. This information is not intended to replace advice given to you by your health care provider. Make sure you discuss any questions you have with your health care provider. Document Revised: 07/05/2017 Document Reviewed: 07/05/2017 Elsevier Patient Education  [place].

## 2019-11-27 NOTE — Assessment & Plan Note (Signed)
Did not try famotidine.  Continue appropriate reflux lifestyle modifications.  INCREASE omeprazole (Prilosec) to 40mg  daily, 30 minutes prior to breakfast.

## 2019-11-27 NOTE — Assessment & Plan Note (Addendum)
Past history - 2019 skin testing was positive to grass, trees, mold, cat, dog, cockroach and dust mites. Started AIT on 11/12/2018 (G-T-DM-D & M-C-CR). Interim history - Increased nasal symptoms. Only using azelastine right now. Noticed worsening symptoms after dog show every fall. Seems like every 2 weeks injections not helping as much as weekly injections.  Continue environmental control measures.  Will change allergy shots to once a week instead of every 2 weeks.  START Flonase (fluticasone) nasal spray 1 spray per nostril twice a day as needed for nasal congestion.   Take a daily over the counter antihistamines such as Zyrtec (cetirizine), Claritin (loratadine), Allegra (fexofenadine), or Xyzal (levocetirizine) once a day.   Start ipratropium nasal spray and Singulair as above.

## 2019-12-03 ENCOUNTER — Ambulatory Visit (INDEPENDENT_AMBULATORY_CARE_PROVIDER_SITE_OTHER): Payer: PPO

## 2019-12-03 DIAGNOSIS — J309 Allergic rhinitis, unspecified: Secondary | ICD-10-CM | POA: Diagnosis not present

## 2019-12-04 ENCOUNTER — Ambulatory Visit: Payer: PPO | Admitting: Allergy

## 2019-12-04 DIAGNOSIS — M79642 Pain in left hand: Secondary | ICD-10-CM | POA: Diagnosis not present

## 2019-12-04 DIAGNOSIS — M19042 Primary osteoarthritis, left hand: Secondary | ICD-10-CM | POA: Diagnosis not present

## 2019-12-09 ENCOUNTER — Ambulatory Visit (INDEPENDENT_AMBULATORY_CARE_PROVIDER_SITE_OTHER): Payer: PPO | Admitting: *Deleted

## 2019-12-09 DIAGNOSIS — J309 Allergic rhinitis, unspecified: Secondary | ICD-10-CM | POA: Diagnosis not present

## 2019-12-09 NOTE — Progress Notes (Signed)
Exp 12/10/20

## 2019-12-12 DIAGNOSIS — J3089 Other allergic rhinitis: Secondary | ICD-10-CM | POA: Diagnosis not present

## 2019-12-13 DIAGNOSIS — J3081 Allergic rhinitis due to animal (cat) (dog) hair and dander: Secondary | ICD-10-CM | POA: Diagnosis not present

## 2019-12-16 ENCOUNTER — Telehealth: Payer: Self-pay | Admitting: Adult Health

## 2019-12-16 ENCOUNTER — Ambulatory Visit (INDEPENDENT_AMBULATORY_CARE_PROVIDER_SITE_OTHER): Payer: PPO | Admitting: *Deleted

## 2019-12-16 DIAGNOSIS — J309 Allergic rhinitis, unspecified: Secondary | ICD-10-CM | POA: Diagnosis not present

## 2019-12-16 NOTE — Progress Notes (Signed)
  Chronic Care Management   Note  12/16/2019 Name: Marcus Gomez [facility]. MRN: [id number] DOB: [birth year]  Marcus Stanford Everest Brod. is a 74 y.o. year old male who is a primary care patient of [name], NP. I reached out to [employer]. by phone today in response to a referral sent by [name]'s PCP, [name], NP.   Marcus Gomez was given information about Chronic Care Management services today including:  1. CCM service includes personalized support from designated clinical staff supervised by his physician, including individualized plan of care and coordination with other care providers 2. 24/7 contact phone numbers for assistance for urgent and routine care needs. 3. Service will only be billed when office clinical staff spend 20 minutes or more in a month to coordinate care. 4. Only one practitioner may furnish and bill the service in a calendar month. 5. The patient may stop CCM services at any time (effective at the end of the month) by phone call to the office staff.   Patient agreed to services and verbal consent obtained.   Follow up plan:   Carley Perdue [employer] Scheduler

## 2019-12-24 ENCOUNTER — Ambulatory Visit (INDEPENDENT_AMBULATORY_CARE_PROVIDER_SITE_OTHER): Payer: PPO | Admitting: *Deleted

## 2019-12-24 DIAGNOSIS — J309 Allergic rhinitis, unspecified: Secondary | ICD-10-CM

## 2019-12-24 DIAGNOSIS — R339 Retention of urine, unspecified: Secondary | ICD-10-CM | POA: Diagnosis not present

## 2019-12-24 DIAGNOSIS — C61 Malignant neoplasm of prostate: Secondary | ICD-10-CM | POA: Diagnosis not present

## 2019-12-30 ENCOUNTER — Ambulatory Visit (INDEPENDENT_AMBULATORY_CARE_PROVIDER_SITE_OTHER): Payer: PPO

## 2019-12-30 DIAGNOSIS — J309 Allergic rhinitis, unspecified: Secondary | ICD-10-CM

## 2020-01-06 DIAGNOSIS — M19042 Primary osteoarthritis, left hand: Secondary | ICD-10-CM | POA: Diagnosis not present

## 2020-01-07 ENCOUNTER — Ambulatory Visit (INDEPENDENT_AMBULATORY_CARE_PROVIDER_SITE_OTHER): Payer: PPO | Admitting: *Deleted

## 2020-01-07 DIAGNOSIS — J309 Allergic rhinitis, unspecified: Secondary | ICD-10-CM

## 2020-01-13 ENCOUNTER — Telehealth: Payer: Self-pay | Admitting: Pharmacist

## 2020-01-13 ENCOUNTER — Ambulatory Visit (INDEPENDENT_AMBULATORY_CARE_PROVIDER_SITE_OTHER): Payer: PPO | Admitting: *Deleted

## 2020-01-13 DIAGNOSIS — J309 Allergic rhinitis, unspecified: Secondary | ICD-10-CM | POA: Diagnosis not present

## 2020-01-13 NOTE — Chronic Care Management (AMB) (Signed)
° ° °  Chronic Care Management Pharmacy Assistant   Name: Marcus Gomez.  MRN: [id number] DOB: [birth year]  Reason for Encounter: Medication Review/Initial Questions for Pharmacist visit on 01-14-2020  Patient Questions:  1. Have you seen any other providers since your last visit?   Yes, allergy doctor 2. Any changes in your medications or health? No  Yes 3. Any side effects from any medications? No 4. Do you have any symptoms or problems not managed by your medications?  5. Any concerns about your health right now? No 6. Has your provider asked that you check blood pressure, blood sugar, or follow a special diet at home? No 7. Do you get any type of exercise regularly?  yes 8. Can you think of a goal you would like to reach for your health?   He would like to live to 75 years old 51. Do you have any problems getting your medications?  10. Is there anything that you would like to discuss during the appointment? No  The patient was asked to please bring medications, blood pressure/ blood sugar log, and supplements to him appointment.       PCP : Shirline Frees, NP  Allergies:   Allergies  Allergen Reactions   Lisinopril Cough    Medications: Outpatient Encounter Medications as of 01/13/2020  Medication Sig   EPINEPHrine 0.3 mg/0.3 mL IJ SOAJ injection Inject 0.3 mLs (0.3 mg total) into the muscle once as needed for up to 1 dose for anaphylaxis.   fluticasone (FLONASE) 50 MCG/ACT nasal spray Place 1 spray into both nostrils in the morning and at bedtime. For nasal congestion   ipratropium (ATROVENT) 0.03 % nasal spray Place 2 sprays into both nostrils every 12 (twelve) hours. For drainage   losartan (COZAAR) 50 MG tablet Take 1 tablet (50 mg total) by mouth daily.   montelukast (SINGULAIR) 10 MG tablet Take 1 tablet (10 mg total) by mouth at bedtime.   Multiple Vitamins-Minerals (ONE-A-DAY MENS 50+ PO) Take by mouth.   Multiple Vitamins-Minerals  (PRESERVISION AREDS 2+MULTI VIT PO) Take by mouth.   Olopatadine HCl (PATADAY) 0.2 % SOLN Place 1 drop into both eyes 1 day or 1 dose.   omeprazole (PRILOSEC) 40 MG capsule Take 40mg  daily 30 minutes before breakfast.   Turmeric (QC TUMERIC COMPLEX PO) Take by mouth.   No facility-administered encounter medications on file as of 01/13/2020.    Current Diagnosis: Patient Active Problem List   Diagnosis Date Noted   Acute maxillary sinusitis 07/09/2018   GERD (gastroesophageal reflux disease) 07/24/2017   Perennial and seasonal allergic rhinitis 03/13/2017   Allergic conjunctivitis 03/13/2017   Coughing 03/13/2017   Lung field abnormal finding on examination 08/31/2015   Essential hypertension 08/31/2015    Goals Addressed   None     Follow-Up:  Pharmacist Review   09/02/2015, [facility] Clinical Pharmacy Assistant [phone]

## 2020-01-14 ENCOUNTER — Other Ambulatory Visit: Payer: Self-pay | Admitting: Adult Health

## 2020-01-14 ENCOUNTER — Ambulatory Visit: Payer: PPO | Admitting: Pharmacist

## 2020-01-14 DIAGNOSIS — I10 Essential (primary) hypertension: Secondary | ICD-10-CM

## 2020-01-14 DIAGNOSIS — E782 Mixed hyperlipidemia: Secondary | ICD-10-CM

## 2020-01-14 NOTE — Chronic Care Management (AMB) (Signed)
Chronic Care Management Pharmacy  Name: Marcus Gomez.  MRN: [id number] DOB: 03-16-1945  Initial Planning Appointment: completed 01/13/20  Initial Questions: 1. Have you seen any other providers since your last visit? n/a 2. Any changes in your medicines or health? No   Chief Complaint/ HPI  Marcus Rosko [name].,  75 y.o. , male presents for their Initial CCM visit with the clinical pharmacist via telephone due to COVID-19 Pandemic.  PCP : [name], NP  Their chronic conditions include: HTN, HLD, GERD, allergic rhinits, arthritis  Office Visits: 07/24/19 [name], NP: Patient presented for follow up. Prescribed rosuvastatin 5 mg daily. Fasting glucose elevated. Recommend restarting losartan 50 mg daily and encouraged to monitor BP at home.  Consult Visit: 01/13/20 Chip Boer, CMA (allergy): Patient presented for immunotherapy injections.  01/06/20 Daryll Brod (orthopedics): Patient presented for arthritis in hand follow up.   12/04/19 Daryll Brod (orthopedics): Patient presented for left hand pain. Refilled celecoxib 200 mg daily.  11/27/19 Rexene Alberts, DO (allergy): Patient presented for follow up for allergies. Prescribed Flonase, montelukast and increased omeprazole to 40 mg daily.  10/30/19 Daryll Brod (orthopedics): Patient presented for left hand pain. Prescribed celecoxib 200 mg daily.  09/09/19 Golda Acre, MD (allergy): Patient presented for cough evaluation. Prescribed omeprazole 20 mg, azelastine & fluticasone nasal spray PRN.  Medications: Outpatient Encounter Medications as of 01/14/2020  Medication Sig   EPINEPHrine 0.3 mg/0.3 mL IJ SOAJ injection Inject 0.3 mLs (0.3 mg total) into the muscle once as needed for up to 1 dose for anaphylaxis.   fexofenadine (ALLEGRA) 180 MG tablet Take 180 mg by mouth daily as needed for allergies or rhinitis.   fluticasone (FLONASE) 50 MCG/ACT nasal spray Place 1 spray into both nostrils in  the morning and at bedtime. For nasal congestion   ipratropium (ATROVENT) 0.03 % nasal spray Place 2 sprays into both nostrils every 12 (twelve) hours. For drainage   losartan (COZAAR) 50 MG tablet Take 1 tablet (50 mg total) by mouth daily.   montelukast (SINGULAIR) 10 MG tablet Take 1 tablet (10 mg total) by mouth at bedtime.   Multiple Vitamins-Minerals (ONE-A-DAY MENS 50+ PO) Take by mouth.   Multiple Vitamins-Minerals (PRESERVISION AREDS 2+MULTI VIT PO) Take 1 capsule by mouth in the morning and at bedtime.   omeprazole (PRILOSEC) 40 MG capsule Take 75m daily 30 minutes before breakfast.   rosuvastatin (CRESTOR) 5 MG tablet Take 5 mg by mouth 2 (two) times a week.   Turmeric (QC TUMERIC COMPLEX PO) Take 1,000 mg by mouth in the morning and at bedtime.   [DISCONTINUED] Olopatadine HCl (PATADAY) 0.2 % SOLN Place 1 drop into both eyes 1 day or 1 dose.   No facility-administered encounter medications on file as of 01/14/2020.   Patient reports that overall he does not like medications and is unsure if he really needs them. He was not taking any medications up until 3 years ago.   Patient reports his biggest challenge right now is getting his allergies under control. He recently changed allergy doctors and has been getting immunotherapy weekly injections. He was feeling better but the weather that came in over the weekend made him stuffed up again. 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 He works about 3-4 days a week.  Patient spends a lot of time fishing and makes his work schedule around it. He also lives with his daughter who just turned  7 and moved in with him recently. He owns a 4 bedroom and 3 bathroom house and was living in it by himself.  He does have someone coming in to help him clean his house but it is hard to keep people. He has 7 dogs, some of which are left over from his dog showing days, but they are all  older.   Patient reports eating out about 70% of the time and its usually food from restaurants and not fast food. He does eat at the house some and eats seafood 1-2 days a week. For breakfast he eats at home about half the time and usually has cheerios with strawberries.  For exercise, he tries to walk a couple of miles at least 3 times a week but his knees and shoulder bothers him some. He does report that the Celebrex helps with his knees.  Current Diagnosis/Assessment:  Goals Addressed            This Visit's Progress    Pharmacy Care Plan       CARE PLAN ENTRY (see longitudinal plan of care for additional care plan information)  Current Barriers:   Chronic Disease Management support, education, and care coordination needs related to Hypertension, Hyperlipidemia, Allergic Rhinitis, and arthritis   Hypertension BP Readings from Last 3 Encounters:  11/27/19 122/82  09/09/19 130/70  07/24/19 (!) 150/78    Pharmacist Clinical Goal(s): o Over the next 90 days, patient will work with PharmD and providers to maintain BP goal <140/90  Current regimen:  o Losartan 50 mg 1 tablet daily  Interventions: o Discussed the importance of checking blood pressure at home while taking blood pressure medications o Discussed DASH eating plan recommendations:  Emphasizes vegetables, fruits, and whole-grains  Includes fat-free or low-fat dairy products, fish, poultry, beans, nuts, and vegetable oils  Limits foods that are high in saturated fat. These foods include fatty meats, full-fat dairy products, and tropical oils such as coconut, palm kernel, and palm oils.  Limits sugar-sweetened beverages and sweets  Limiting sodium intake to < 1500 mg/day   Patient self care activities - Over the next 90 days, patient will: o Check blood pressure weekly, document, and provide at future appointments o Ensure daily salt intake < 2300 mg/day  Hyperlipidemia Lab Results  Component Value  Date/Time   LDLCALC 114 (H) 07/24/2019 08:23 AM    Pharmacist Clinical Goal(s): o Over the next 90 days, patient will work with PharmD and providers to achieve LDL goal < 100  Current regimen:  o Rosuvastatin 5 mg 1 tablet twice weekly  Interventions: o Discussed lowering cholesterol through diet by:  Limiting foods with cholesterol such as liver and other organ meats, egg yolks, shrimp, and whole milk dairy products  Avoiding saturated fats and trans fats and incorporating healthier fats, such as lean meat, nuts, and unsaturated oils like canola and olive oils  Eating foods with soluble fiber such as whole-grain cereals such as oatmeal and oat bran, fruits such as apples, bananas, oranges, pears, and prunes, legumes such as kidney beans, lentils, chick peas, black-eyed peas, and lima beans, and green leafy vegetables  Limiting alcohol intake   Patient self care activities - Over the next 90 days, patient will: o Continue current medications  Allergic rhinitis  Pharmacist Clinical Goal(s): o Over the next 90 days, patient will work with PharmD and providers to manage symptoms of allergic rhinitis  Current regimen:   Flonase 50 mcg/act nasal spray 1 spray in the  morning and at bedtime  Ipratropium 0.03% nasal spray 1 spray in each nostril in the morning and bedtime  Montelukast 10 mg 1 tablet daily at bedtime  Allegra 160 mg 1 tablet as needed  Interventions: o Discussed avoiding allergy triggers  Patient self care activities - Over the next 90 days, patient will: o Continue current medications  Arthritis  Pharmacist Clinical Goal(s) o Over the next 90 days, patient will work with PharmD and providers to manage pain with arthritis  Current regimen:  o Celecoxib 200 mg 1 capsule every other day with food  Interventions: o Discussed preference of Tylenol over NSAIDs such as Advil or Aleve due to risk of bleeding with Celebrex use o Recommended a trial of diclofenac  gel to see if this helps with joint pain  Patient self care activities - Over the next 90 days, patient will: o Continue current medication o Trial diclofenac gel to see if this helps with joint pain  Medication management  Pharmacist Clinical Goal(s): o Over the next 90 days, patient will work with PharmD and providers to maintain optimal medication adherence  Current pharmacy: [facility]  Interventions o Comprehensive medication review performed. o Continue current medication management strategy  Patient self care activities - Over the next 90 days, patient will: o Take medications as prescribed o Report any questions or concerns to PharmD and/or provider(s)  Initial goal documentation       SDOH Interventions   Flowsheet Row Most Recent Value  SDOH Interventions   Financial Strain Interventions Intervention Not Indicated  Transportation Interventions Intervention Not Indicated      Hypertension   BP goal is:  <140/90  Office blood pressures are  BP Readings from Last 3 Encounters:  11/27/19 122/82  09/09/19 130/70  07/24/19 (!) 150/78   Patient checks BP at home none Patient home BP readings are ranging: n/a  Patient has failed these meds in the past: none Patient is currently controlled on the following medications:   Losartan 50 mg 1 tablet daily  We discussed diet and exercise extensively  -DASH eating plan recommendations:  Emphasizes vegetables, fruits, and whole-grains  Includes fat-free or low-fat dairy products, fish, poultry, beans, nuts, and vegetable oils  Limits foods that are high in saturated fat. These foods include fatty meats, full-fat dairy products, and tropical oils such as coconut, palm kernel, and palm oils.  Limits sugar-sweetened beverages and sweets  Limiting sodium intake to < 1500 mg/day -Diet: doesn't add salt to food; using a lower sodium option -Discussed the importance of checking blood pressure at home while taking a  blood pressure medicine   Plan Patient will start checking blood pressure at home Continue current medications     Hyperlipidemia   LDL goal < 100  Last lipids Lab Results  Component Value Date   CHOL 187 07/24/2019   HDL 51 07/24/2019   LDLCALC 114 (H) 07/24/2019   TRIG 112 07/24/2019   CHOLHDL 3.7 07/24/2019   Hepatic Function Latest Ref Rng & Units 07/24/2019 01/25/2018 03/16/2016  Total Protein 6.1 - 8.1 g/dL 6.5 6.8 6.8  Albumin 3.5 - 5.2 g/dL - 4.2 4.2  AST 10 - 35 U/L 19 22 15   ALT 9 - 46 U/L 19 26 15   Alk Phosphatase 39 - 117 U/L - 65 76  Total Bilirubin 0.2 - 1.2 mg/dL 1.2 1.0 1.0  Bilirubin, Direct 0.0 - 0.3 mg/dL - 0.2 0.2     The 10-year ASCVD risk score (Dryden.,  et al., 2013) is: 24.1%   Values used to calculate the score:     Age: 61 years     Sex: Male     Is Non-Hispanic African American: No     Diabetic: No     Tobacco smoker: No     Systolic Blood Pressure: 371 mmHg     Is BP treated: Yes     HDL Cholesterol: 51 mg/dL     Total Cholesterol: 187 mg/dL   Patient has failed these meds in past: none Patient is currently uncontrolled on the following medications:   Rosuvastatin 5 mg 1 tablet twice weekly - Saturday night and another day during the week  We discussed:  diet and exercise extensively  -Lowering cholesterol through diet by:  Limiting foods with cholesterol such as liver and other organ meats, egg yolks, shrimp, and whole milk dairy products  Avoiding saturated fats and trans fats and incorporating healthier fats, such as lean meat, nuts, and unsaturated oils like canola and olive oils  Eating foods with soluble fiber such as whole-grain cereals such as oatmeal and oat bran, fruits such as apples, bananas, oranges, pears, and prunes, legumes such as kidney beans, lentils, chick peas, black-eyed peas, and lima beans, and green leafy vegetables  Limiting alcohol intake -Patient reports he cut back on beer because of the Celebrex which  may help his cholesterol  Plan Will discuss with PCP about trying another statin as patient still has some upset stomach. Continue current medications  GERD   Patient has failed these meds in past: none Patient is currently controlled on the following medications:   Omeprazole 40 mg 1 capsule daily before breakfast  We discussed:  Patient denies any heartburn but does admit that coughing has subsided since taking this every day  Plan  Continue current medications  Allergic rhinitis   Patient is currently controlled on the following medications:   Flonase 50 mcg/act nasal spray 1 spray in the morning and at bedtime  Ipratropium 0.03% nasal spray 1 spray in each nostril in the morning and bedtime  Montelukast 10 mg 1 tablet daily at bedtime  Allegra 160 mg 1 tablet PRN   We discussed: patient reports this current regimen seems to be working for his allergies  Plan  Continue current medications   Arthritis   Patient has failed these meds in past: none Patient is currently controlled on the following medications:   Celecoxib 200 mg 1 capsule every other day with food  We discussed:  Patient played football for several years and thinks this is from this; discussed preference of Tylenol over NSAIDs and recommended trial of diclofenac gel  Plan Managed by Dr. Fredna Dow. Continue current medications  Miscellaneous/OTC   Patient is currently on the following medications:   Turmeric 1000 mg 1 tablet daily  Pataday 0.2% 1 drop in both eyes  Multivitamin 1 tablet daily  Preservision Areds 2 1 tablet twice daily  SootheXP eye drops 1-2 times a day  Eye gel daily  We discussed:  Discussed bleed risk with Turmeric and NSAID use - patient reports it is working and will continue to use for now  Plan  Continue current medications  Vaccines   Reviewed and discussed patient's vaccination history.    Immunization History  Administered Date(s) Administered    Influenza, High Dose Seasonal PF 11/03/2017   Moderna SARS-COV2 Booster Vaccination 12/21/2019   Moderna Sars-Covid-2 Vaccination 02/22/2019, 03/22/2019   Pneumococcal Conjugate-13 03/16/2016   Pneumococcal Polysaccharide-23 01/25/2018  Verified COVID vaccine administration dates with [facility] and added to immunization record.  Plan  Recommended patient receive shingles, influenza, and tetanus vaccine in office/at pharmacy.  Patient reported he plans to get influenza vaccine next week.   Medication Management   Patient's preferred pharmacy is:  [facility] [address] [place] [address] Phone: [phone] Fax: [phone]  [facility] ([address]), [place] [address] [place] [address] Phone: [phone] Fax: [phone]  Uses pill box? No - sitting in the cabinet - all in different cabinets for different times of day  Pt endorses 100% compliance   We discussed: Discussed benefits of medication synchronization, packaging and delivery as well as enhanced pharmacist oversight with [employer].  Plan  Continue current medication management strategy  Follow up: 4 month phone visit  [name], PharmD [facility]ist [place] [place] [phone]

## 2020-01-16 DIAGNOSIS — N401 Enlarged prostate with lower urinary tract symptoms: Secondary | ICD-10-CM | POA: Diagnosis not present

## 2020-01-16 DIAGNOSIS — N138 Other obstructive and reflux uropathy: Secondary | ICD-10-CM | POA: Diagnosis not present

## 2020-01-17 ENCOUNTER — Other Ambulatory Visit: Payer: Self-pay | Admitting: Adult Health

## 2020-01-17 MED ORDER — SIMVASTATIN 10 MG PO TABS: 10.0000 mg | ORAL_TABLET | ORAL | 0 refills | Status: DC

## 2020-01-17 NOTE — Patient Instructions (Addendum)
Hi [name]!  It was so lovely to get to meet you over the phone this week! Keep up the good work with taking care of yourself through regular exercise and making healthy diet choices and taking your medications as prescribed. Please start checking your blood pressure at home regularly to make sure your medication is working properly. I have attached some helpful tips for checking your blood pressure at home and will plan on having my assistant reach out to you to see how the at home monitoring is going in a few months.  [name] also sent in a replacement called simvastatin for the rosuvastatin to see how you tolerate or see if it upsets your stomach. Let me know if you have any issues with it!  Please give me a call if you need anything or have any questions before our follow up!  Best, Maddie  [name], PharmD [facility] Clinical Pharmacist [place] [place]   Visit Information  Goals Addressed            This Visit's Progress   . Pharmacy Care Plan       CARE PLAN ENTRY (see longitudinal plan of care for additional care plan information)  Current Barriers:  . Chronic Disease Management support, education, and care coordination needs related to Hypertension, Hyperlipidemia, Allergic Rhinitis, and arthritis   Hypertension BP Readings from Last 3 Encounters:  11/27/19 122/82  09/09/19 130/70  07/24/19 (!) 150/78   . Pharmacist Clinical Goal(s): o Over the next 90 days, patient will work with PharmD and providers to maintain BP goal <140/90 . Current regimen:  o Losartan 50 mg 1 tablet daily . Interventions: o Discussed the importance of checking blood pressure at home while taking blood pressure medications o Discussed DASH eating plan recommendations: . Emphasizes vegetables, fruits, and whole-grains . Includes fat-free or low-fat dairy products, fish, poultry, beans, nuts, and vegetable oils . Limits foods that are high in saturated fat. These  foods include fatty meats, full-fat dairy products, and tropical oils such as coconut, palm kernel, and palm oils. . Limits sugar-sweetened beverages and sweets . Limiting sodium intake to < 1500 mg/day  . Patient self care activities - Over the next 90 days, patient will: o Check blood pressure weekly, document, and provide at future appointments o Ensure daily salt intake < 2300 mg/day  Hyperlipidemia Lab Results  Component Value Date/Time   LDLCALC 114 (H) 07/24/2019 08:23 AM   . Pharmacist Clinical Goal(s): o Over the next 90 days, patient will work with PharmD and providers to achieve LDL goal < 100 . Current regimen:  o Rosuvastatin 5 mg 1 tablet twice weekly . Interventions: o Discussed lowering cholesterol through diet by: Marland Kitchen Limiting foods with cholesterol such as liver and other organ meats, egg yolks, shrimp, and whole milk dairy products . Avoiding saturated fats and trans fats and incorporating healthier fats, such as lean meat, nuts, and unsaturated oils like canola and olive oils . Eating foods with soluble fiber such as whole-grain cereals such as oatmeal and oat bran, fruits such as apples, bananas, oranges, pears, and prunes, legumes such as kidney beans, lentils, chick peas, black-eyed peas, and lima beans, and green leafy vegetables . Limiting alcohol intake  . Patient self care activities - Over the next 90 days, patient will: o Continue current medications  Allergic rhinitis . Pharmacist Clinical Goal(s): o Over the next 90 days, patient will work with PharmD and providers to manage symptoms of allergic rhinitis .  Current regimen:  . Flonase 50 mcg/act nasal spray 1 spray in the morning and at bedtime . Ipratropium 0.03% nasal spray 1 spray in each nostril in the morning and bedtime . Montelukast 10 mg 1 tablet daily at bedtime . Allegra 160 mg 1 tablet as needed . Interventions: o Discussed avoiding allergy triggers . Patient self care activities - Over the  next 90 days, patient will: o Continue current medications  Arthritis . Pharmacist Clinical Goal(s) o Over the next 90 days, patient will work with PharmD and providers to manage pain with arthritis . Current regimen:  o Celecoxib 200 mg 1 capsule every other day with food . Interventions: o Discussed preference of Tylenol over NSAIDs such as Advil or Aleve due to risk of bleeding with Celebrex use o Recommended a trial of diclofenac gel to see if this helps with joint pain . Patient self care activities - Over the next 90 days, patient will: o Continue current medication o Trial diclofenac gel to see if this helps with joint pain  Medication management . Pharmacist Clinical Goal(s): o Over the next 90 days, patient will work with PharmD and providers to maintain optimal medication adherence . Current pharmacy: [facility] . Interventions o Comprehensive medication review performed. o Continue current medication management strategy . Patient self care activities - Over the next 90 days, patient will: o Take medications as prescribed o Report any questions or concerns to PharmD and/or provider(s)  Initial goal documentation        Mr. Coia was given information about Chronic Care Management services today including:  1. CCM service includes personalized support from designated clinical staff supervised by his physician, including individualized plan of care and coordination with other care providers 2. 24/7 contact phone numbers for assistance for urgent and routine care needs. 3. Standard insurance, coinsurance, copays and deductibles apply for chronic care management only during months in which we provide at least 20 minutes of these services. Most insurances cover these services at 100%, however patients may be responsible for any copay, coinsurance and/or deductible if applicable. This service may help you avoid the need for more expensive face-to-face services. 4. Only one  practitioner may furnish and bill the service in a calendar month. 5. The patient may stop CCM services at any time (effective at the end of the month) by phone call to the office staff.  Patient agreed to services and verbal consent obtained.   The patient verbalized understanding of instructions, educational materials, and care plan provided today and agreed to receive a mailed copy of patient instructions, educational materials, and care plan.  Telephone follow up appointment with pharmacy team member scheduled for: 4 months  [name], [facility]  How to Take Your Blood Pressure You can take your blood pressure at home with a machine. You may need to check your blood pressure at home:  To check if you have high blood pressure (hypertension).  To check your blood pressure over time.  To make sure your blood pressure medicine is working. Supplies needed: You will need a blood pressure machine, or monitor. You can buy one at a drugstore or online. When choosing one:  Choose one with an arm cuff.  Choose one that wraps around your upper arm. Only one finger should fit between your arm and the cuff.  Do not choose one that measures your blood pressure from your wrist or finger. Your doctor can suggest a monitor. How to prepare Avoid these things for  30 minutes before checking your blood pressure:  Drinking caffeine.  Drinking alcohol.  Eating.  Smoking.  Exercising. Five minutes before checking your blood pressure:  Pee.  Sit in a dining chair. Avoid sitting in a soft couch or armchair.  Be quiet. Do not talk. How to take your blood pressure Follow the instructions that came with your machine. If you have a digital blood pressure monitor, these may be the instructions: 1. Sit up straight. 2. Place your feet on the floor. Do not cross your ankles or legs. 3. Rest your left arm at the level of your heart. You may rest it on a table, desk, or chair. 4. Pull up your  shirt sleeve. 5. Wrap the blood pressure cuff around the upper part of your left arm. The cuff should be 1 inch (2.5 cm) above your elbow. It is best to wrap the cuff around bare skin. 6. Fit the cuff snugly around your arm. You should be able to place only one finger between the cuff and your arm. 7. Put the cord inside the groove of your elbow. 8. Press the power button. 9. Sit quietly while the cuff fills with air and loses air. 10. Write down the numbers on the screen. 11. Wait 2-3 minutes and then repeat steps 1-10. What do the numbers mean? Two numbers make up your blood pressure. The first number is called systolic pressure. The second is called diastolic pressure. An example of a blood pressure reading is "120 over 80" (or 120/80). If you are an adult and do not have a medical condition, use this guide to find out if your blood pressure is normal: Normal  First number: below 120.  Second number: below 80. Elevated  First number: 120-129.  Second number: below 80. Hypertension stage 1  First number: 130-139.  Second number: 80-89. Hypertension stage 2  First number: 140 or above.  Second number: 36 or above. Your blood pressure is above normal even if only the top or bottom number is above normal. Follow these instructions at home:  Check your blood pressure as often as your doctor tells you to.  Take your monitor to your next doctor's appointment. Your doctor will: ? Make sure you are using it correctly. ? Make sure it is working right.  Make sure you understand what your blood pressure numbers should be.  Tell your doctor if your medicines are causing side effects. Contact a doctor if:  Your blood pressure keeps being high. Get help right away if:  Your first blood pressure number is higher than 180.  Your second blood pressure number is higher than 120. This information is not intended to replace advice given to you by your health care provider. Make sure  you discuss any questions you have with your health care provider. Document Revised: 12/09/2016 Document Reviewed: 06/05/2015 Elsevier Patient Education  2020 [employer].

## 2020-01-20 ENCOUNTER — Ambulatory Visit (INDEPENDENT_AMBULATORY_CARE_PROVIDER_SITE_OTHER): Payer: PPO

## 2020-01-20 DIAGNOSIS — J309 Allergic rhinitis, unspecified: Secondary | ICD-10-CM | POA: Diagnosis not present

## 2020-01-22 ENCOUNTER — Telehealth: Payer: Self-pay | Admitting: Allergy

## 2020-01-22 NOTE — Telephone Encounter (Signed)
Left a message for patient to call the office in regards to this matter. 

## 2020-01-22 NOTE — Telephone Encounter (Signed)
Starting last week patient has had non-stop nasal congestion, all stopped up with heavy mucus. No coughing or sneezing which was the original problem. Patient is taking all of his medications, montelukast and nasal sprays, but would like to know if there is something else he can take or do. Patient has an appointment 01/19.   Please advise.

## 2020-01-22 NOTE — Telephone Encounter (Signed)
Spoke with patient states he is doing his nasal spray Flonase as directed is going to begin taking antihistamines as mentioned before. Any further advise we can give him?

## 2020-01-22 NOTE — Telephone Encounter (Signed)
Attempted to contact patient unfortunately the number listed was incorrect and needs to be removed. Contacted patient's emergency number who is his daughter and left a detail message to have patient call us back to get updated phone information and to see how he is doing since he received 0.4 cc of Red vial given on 01/20/2020

## 2020-01-22 NOTE — Telephone Encounter (Signed)
I advised him at the last visit to start Flonase, did he start it?   START Flonase (fluticasone) nasal spray 1 spray per nostril twice a day as needed for nasal congestion.

## 2020-01-22 NOTE — Telephone Encounter (Signed)
Patient has a running nose since August and has been consistent. Since seeing Dr. Maudie Mercury patient was doing well and getting congested as before. Patient had tried Allegra last week which has not improve, Claritin in the past has worked. Advise patient to try Zyrtec or Xyzal to see if this helps. Patient did say he is not consistent in taking antihistamines and will try this time due to his symptoms. IF this doesn't work do you have any other recommendations?

## 2020-01-23 NOTE — Telephone Encounter (Signed)
Patient notified

## 2020-01-23 NOTE — Telephone Encounter (Signed)
He can take some OTC Afrin nasal spray for 3-5 days for the nasal congestion flare.  Not recommended to take for long term but okay to take for a few days at a time.   Keep appointment later this month to discuss further.

## 2020-01-28 NOTE — Progress Notes (Deleted)
Follow Up Note  RE: Marcus Gomez. MRN: [id number] DOB: [birth year] Date of Office Visit: 01/29/2020  Referring provider: [name], NP Primary care provider: [name], NP  Chief Complaint: No chief complaint on file.  History of Present Illness: I had the pleasure of seeing Marcus Gomez for a follow up visit at the [facility] [place] [facility] [place] on 01/28/2020. He is a 75 y.o. male, who is being followed for coughing, allergic rhinitis on AIT and GERD. His previous allergy office visit was on 01/27/2019 with Dr. Maudie Mercury. Today is a regular follow up visit.  He can take some OTC Afrin nasal spray for 3-5 days for the nasal congestion flare.  Not recommended to take for long term but okay to take for a few days at a time.   Coughing Coughing improved initially after prednisone and Tussionex.  However now having another flare which started 2.5 weeks ago.  Worse in the mornings and at night.  Having posttussive emesis at times.  No prior diagnosis of asthma or albuterol use.  Denies any significant reflux symptoms.  Discussed with patient that coughing can be multifactorial in nature. In his case I'm concerned if PND and GERD may be contributing to it.    Today's spirometry was normal with no improvement in FEV1 post bronchodilator treatment. Clinically feeling the same.   Get chest X-ray in 1-2 weeks if symptoms not improving. Last X-ray was over 1 year ago.   INCREASE omeprazole to 40mg  daily.  START ipratropium nasal spray 1-2 sprays per nostril twice a day as needed for runny nose/drainage. ? STOP azelastine nasal spray (white bottle).  Start Singulair (montelukast) 10mg  daily at night. See if it helps with your coughing.   Cautioned that in some children/adults can experience behavioral changes including hyperactivity, agitation, depression, sleep disturbances and suicidal ideations. These side effects are rare, but if you notice them you should notify me  and discontinue Singulair (montelukast).  Perennial and seasonal allergic rhinitis Past history - 2019 skin testing was positive to grass, trees, mold, cat, dog, cockroach and dust mites. Started AIT on 11/12/2018(G-T-DM-D & M-C-CR). Interim history - Increased nasal symptoms. Only using azelastine right now. Noticed worsening symptoms after dog show every fall. Seems like every 2 weeks injections not helping as much as weekly injections.  Continue environmental control measures.  Will change allergy shots to once a week instead of every 2 weeks.  START Flonase (fluticasone) nasal spray 1 spray per nostril twice a day as needed for nasal congestion.   Take a daily over the counter antihistamines such as Zyrtec (cetirizine), Claritin (loratadine), Allegra (fexofenadine), or Xyzal (levocetirizine) once a day.   Start ipratropium nasal spray and Singulair as above.   GERD (gastroesophageal reflux disease) Did not try famotidine.  Continue appropriate reflux lifestyle modifications.  INCREASE omeprazole (Prilosec) to 40mg  daily, 30 minutes prior to breakfast.  Return in about 2 months (around 01/27/2020).   Assessment and Plan: Marcus Gomez is a 75 y.o. male with: No problem-specific Assessment & Plan notes found for this encounter.  No follow-ups on file.  No orders of the defined types were placed in this encounter.  Lab Orders  No laboratory test(s) ordered today    Diagnostics: Spirometry:  Tracings reviewed. His effort: {Blank single:19197::"Good reproducible efforts.","It was hard to get consistent efforts and there is a question as to whether this reflects a maximal maneuver.","Poor effort, data can not be interpreted."} FVC: ***L FEV1: ***L, ***% predicted FEV1/FVC ratio: ***%  Interpretation: {Blank single:19197::"Spirometry consistent with mild obstructive disease","Spirometry consistent with moderate obstructive disease","Spirometry consistent with severe obstructive  disease","Spirometry consistent with possible restrictive disease","Spirometry consistent with mixed obstructive and restrictive disease","Spirometry uninterpretable due to technique","Spirometry consistent with normal pattern","No overt abnormalities noted given today's efforts"}.  Please see scanned spirometry results for details.  Skin Testing: {Blank single:19197::"Select foods","Environmental allergy panel","Environmental allergy panel and select foods","Food allergy panel","None","Deferred due to recent antihistamines use"}. Positive test to: ***. Negative test to: ***.  Results discussed with patient/family.   Medication List:  Current Outpatient Medications  Medication Sig Dispense Refill  . EPINEPHrine 0.3 mg/0.3 mL IJ SOAJ injection Inject 0.3 mLs (0.3 mg total) into the muscle once as needed for up to 1 dose for anaphylaxis. 0.3 mL 1  . fexofenadine (ALLEGRA) 180 MG tablet Take 180 mg by mouth daily as needed for allergies or rhinitis.    . fluticasone (FLONASE) 50 MCG/ACT nasal spray Place 1 spray into both nostrils in the morning and at bedtime. For nasal congestion 16 g 5  . ipratropium (ATROVENT) 0.03 % nasal spray Place 2 sprays into both nostrils every 12 (twelve) hours. For drainage 30 mL 5  . losartan (COZAAR) 50 MG tablet Take 1 tablet (50 mg total) by mouth daily. 90 tablet 3  . montelukast (SINGULAIR) 10 MG tablet Take 1 tablet (10 mg total) by mouth at bedtime. 30 tablet 5  . Multiple Vitamins-Minerals (ONE-A-DAY MENS 50+ PO) Take by mouth.    . Multiple Vitamins-Minerals (PRESERVISION AREDS 2+MULTI VIT PO) Take 1 capsule by mouth in the morning and at bedtime.    Marland Kitchen omeprazole (PRILOSEC) 40 MG capsule Take 40mg  daily 30 minutes before breakfast. 30 capsule 5  . simvastatin (ZOCOR) 10 MG tablet Take 1 tablet (10 mg total) by mouth 2 (two) times a week. 24 tablet 0  . Turmeric (QC TUMERIC COMPLEX PO) Take 1,000 mg by mouth in the morning and at bedtime.     No current  facility-administered medications for this visit.   Allergies: Allergies  Allergen Reactions  . Lisinopril Cough   I reviewed his past medical history, social history, family history, and environmental history and no significant changes have been reported from his previous visit.  Review of Systems  Constitutional: Negative for appetite change, chills, fever and unexpected weight change.  HENT: Positive for congestion and postnasal drip. Negative for rhinorrhea.   Eyes: Negative for itching.  Respiratory: Positive for cough. Negative for chest tightness, shortness of breath and wheezing.   Gastrointestinal: Negative for abdominal pain.  Skin: Negative for rash.  Allergic/Immunologic: Positive for environmental allergies.  Neurological: Negative for headaches.   Objective: There were no vitals taken for this visit. There is no height or weight on file to calculate BMI. Physical Exam Vitals and nursing note reviewed.  Constitutional:      Appearance: Normal appearance. He is well-developed.  HENT:     Head: Normocephalic and atraumatic.     Right Ear: Tympanic membrane and external ear normal.     Left Ear: Tympanic membrane and external ear normal.     Nose: Nose normal.     Mouth/Throat:     Mouth: Mucous membranes are moist.     Pharynx: Oropharynx is clear.  Eyes:     Conjunctiva/sclera: Conjunctivae normal.  Cardiovascular:     Rate and Rhythm: Normal rate and regular rhythm.     Heart sounds: Normal heart sounds. No murmur heard.   Pulmonary:     Effort: Pulmonary  effort is normal.     Breath sounds: Normal breath sounds. No wheezing, rhonchi or rales.  Musculoskeletal:     Cervical back: Neck supple.  Skin:    General: Skin is warm.     Findings: No rash.  Neurological:     Mental Status: He is alert and oriented to person, place, and time.  Psychiatric:        Behavior: Behavior normal.    Previous notes and tests were reviewed. The plan was reviewed with  the patient/family, and all questions/concerned were addressed.  It was my pleasure to see Marcus Gomez today and participate in his care. Please feel free to contact me with any questions or concerns.  Sincerely,  Rexene Alberts, DO Allergy & Immunology  [facility] office: [place] office: [phone]

## 2020-01-29 ENCOUNTER — Ambulatory Visit: Payer: PPO | Admitting: Allergy

## 2020-01-31 ENCOUNTER — Ambulatory Visit (INDEPENDENT_AMBULATORY_CARE_PROVIDER_SITE_OTHER): Payer: PPO | Admitting: *Deleted

## 2020-01-31 DIAGNOSIS — J309 Allergic rhinitis, unspecified: Secondary | ICD-10-CM | POA: Diagnosis not present

## 2020-02-04 ENCOUNTER — Ambulatory Visit (INDEPENDENT_AMBULATORY_CARE_PROVIDER_SITE_OTHER): Payer: PPO

## 2020-02-04 DIAGNOSIS — J309 Allergic rhinitis, unspecified: Secondary | ICD-10-CM | POA: Diagnosis not present

## 2020-02-05 DIAGNOSIS — M6701 Short Achilles tendon (acquired), right ankle: Secondary | ICD-10-CM | POA: Diagnosis not present

## 2020-02-05 DIAGNOSIS — M7661 Achilles tendinitis, right leg: Secondary | ICD-10-CM | POA: Diagnosis not present

## 2020-02-05 DIAGNOSIS — M898X7 Other specified disorders of bone, ankle and foot: Secondary | ICD-10-CM | POA: Diagnosis not present

## 2020-02-10 ENCOUNTER — Ambulatory Visit (INDEPENDENT_AMBULATORY_CARE_PROVIDER_SITE_OTHER): Payer: PPO

## 2020-02-10 DIAGNOSIS — J309 Allergic rhinitis, unspecified: Secondary | ICD-10-CM

## 2020-02-11 NOTE — Progress Notes (Signed)
Follow Up Note  RE: Marcus Gomez. MRN: [id number] DOB: 1945/03/18 Date of Office Visit: 02/12/2020  Referring provider: Shirline Frees, NP Primary care provider: Shirline Frees, NP  Chief Complaint: Allergic Rhinitis  (No issues with allergies today and does not need any refills )  History of Present Illness: I had the pleasure of seeing Marcus Gomez for a follow up visit at the [facility] on 02/12/2020. He is a 75 y.o. male, who is being followed for coughing, allergic rhinitis on AIT and GERD. His previous allergy office visit was on 11/27/2019 with Dr. Selena Batten. Today is a regular follow up visit.  Coughing Resolved after 1 week after the last visit. Did not get CXR.   He did start ipratropium, Singulair and increased omeprazole. Not sure which one is helping the most.  His allergic symptoms seem to be the best controlled in a long time.   Perennial and seasonal allergic rhinitis Takes allegra daily and Flonase 1 spray daily with no nosebleeds. Currently on weekly allergy injections - no reactions.   GERD (gastroesophageal reflux disease) Currently taking omeprazole (Prilosec) 40mg  daily with unknown benefit.  Assessment and Plan: Marcus Gomez is a 75 y.o. male with: Coughing Coughing resolved with adding on ipratropium nasal spray, Singulair and increased Protonix. His allergies are the best they've been. He hasn't tried to wean off any of these medications as he is afraid the coughing will return.  Try to use ipratropium nasal spray 1-2 sprays per nostril twice a day only if needed for nasal drainage.  If you stop the nasal spray and no issues with the coughing:  THEN decrease the omeprazole to 20mg  daily in the mornings.   Perennial and seasonal allergic rhinitis Past history - 2019 skin testing was positive to grass, trees, mold, cat, dog, cockroach and dust mites. Started AIT on 11/12/2018 (G-T-DM-D & M-C-CR). Interim history - now back  on weekly injections and with above medication changes his allergies are under best control they've been in awhile.   Continue environmental control measures.  Continue allergy injections once a week.   Continue Singulair 10mg  daily.  Continue Flonase (fluticasone) nasal spray 1 spray per nostril twice a day as needed for nasal congestion.   Take a daily over the counter antihistamines such as Zyrtec (cetirizine), Claritin (loratadine), Allegra (fexofenadine), or Xyzal (levocetirizine) once a day.   Will try to wean off ipratropium nasal spray as above.   GERD (gastroesophageal reflux disease) Declines any symptoms.  Continue appropriate reflux lifestyle modifications.  Decrease the omeprazole to 20mg  daily in the mornings as instructed above. No food/drink for 30 minutes afterwards.   Return in about 4 months (around 06/11/2020).  Diagnostics: None.  Medication List:  Current Outpatient Medications  Medication Sig Dispense Refill  . EPINEPHrine 0.3 mg/0.3 mL IJ SOAJ injection Inject 0.3 mLs (0.3 mg total) into the muscle once as needed for up to 1 dose for anaphylaxis. 0.3 mL 1  . fexofenadine (ALLEGRA) 180 MG tablet Take 180 mg by mouth daily as needed for allergies or rhinitis.    . fluticasone (FLONASE) 50 MCG/ACT nasal spray Place 1 spray into both nostrils in the morning and at bedtime. For nasal congestion 16 g 5  . ipratropium (ATROVENT) 0.03 % nasal spray Place 2 sprays into both nostrils every 12 (twelve) hours. For drainage 30 mL 5  . losartan (COZAAR) 50 MG tablet Take 1 tablet (50 mg total) by mouth daily. 90 tablet 3  .  montelukast (SINGULAIR) 10 MG tablet Take 1 tablet (10 mg total) by mouth at bedtime. 30 tablet 5  . Multiple Vitamins-Minerals (PRESERVISION AREDS 2+MULTI VIT PO) Take 1 capsule by mouth in the morning and at bedtime.    Marland Kitchen omeprazole (PRILOSEC) 40 MG capsule Take 40mg  daily 30 minutes before breakfast. 30 capsule 5  . simvastatin (ZOCOR) 10 MG tablet  Take 1 tablet (10 mg total) by mouth 2 (two) times a week. 24 tablet 0  . Turmeric (QC TUMERIC COMPLEX PO) Take 1,000 mg by mouth in the morning and at bedtime.    . Multiple Vitamins-Minerals (ONE-A-DAY MENS 50+ PO) Take by mouth. (Patient not taking: Reported on 02/12/2020)     No current facility-administered medications for this visit.   Allergies: Allergies  Allergen Reactions  . Lisinopril Cough   I reviewed his past medical history, social history, family history, and environmental history and no significant changes have been reported from his previous visit.  Review of Systems  Constitutional: Negative for appetite change, chills, fever and unexpected weight change.  HENT: Negative for congestion, postnasal drip and rhinorrhea.   Eyes: Negative for itching.  Respiratory: Negative for cough, chest tightness, shortness of breath and wheezing.   Gastrointestinal: Negative for abdominal pain.  Skin: Negative for rash.  Allergic/Immunologic: Positive for environmental allergies.  Neurological: Negative for headaches.   Objective: Pulse 70   Temp 97.9 F (36.6 C)   Resp 18   Ht 5\' 10"  (1.778 m)   Wt 246 lb 12.8 oz (111.9 kg)   SpO2 96%   BMI 35.41 kg/m  Body mass index is 35.41 kg/m. Physical Exam Vitals and nursing note reviewed.  Constitutional:      Appearance: Normal appearance. He is well-developed.  HENT:     Head: Normocephalic and atraumatic.     Right Ear: Tympanic membrane and external ear normal.     Left Ear: Tympanic membrane and external ear normal.     Nose: Nose normal.     Mouth/Throat:     Mouth: Mucous membranes are moist.     Pharynx: Oropharynx is clear.  Eyes:     Conjunctiva/sclera: Conjunctivae normal.  Cardiovascular:     Rate and Rhythm: Normal rate and regular rhythm.     Heart sounds: Normal heart sounds. No murmur heard.   Pulmonary:     Effort: Pulmonary effort is normal.     Breath sounds: Normal breath sounds. No wheezing,  rhonchi or rales.  Musculoskeletal:     Cervical back: Neck supple.  Skin:    General: Skin is warm.     Findings: No rash.  Neurological:     Mental Status: He is alert and oriented to person, place, and time.  Psychiatric:        Behavior: Behavior normal.    Previous notes and tests were reviewed. The plan was reviewed with the patient/family, and all questions/concerned were addressed.  It was my pleasure to see Abrahan today and participate in his care. Please feel free to contact me with any questions or concerns.  Sincerely,  Wyline Mood, DO Allergy & Immunology  [facility] office: [phone] [facility] office: [phone]

## 2020-02-12 ENCOUNTER — Ambulatory Visit (INDEPENDENT_AMBULATORY_CARE_PROVIDER_SITE_OTHER): Payer: PPO | Admitting: Orthopedic Surgery

## 2020-02-12 ENCOUNTER — Encounter: Payer: Self-pay | Admitting: Allergy

## 2020-02-12 ENCOUNTER — Ambulatory Visit: Payer: PPO | Admitting: Allergy

## 2020-02-12 ENCOUNTER — Other Ambulatory Visit: Payer: Self-pay

## 2020-02-12 ENCOUNTER — Ambulatory Visit (INDEPENDENT_AMBULATORY_CARE_PROVIDER_SITE_OTHER): Payer: PPO

## 2020-02-12 VITALS — HR 70 | Temp 97.9°F | Resp 18 | Ht 70.0 in | Wt 246.8 lb

## 2020-02-12 DIAGNOSIS — M25511 Pain in right shoulder: Secondary | ICD-10-CM

## 2020-02-12 DIAGNOSIS — J3089 Other allergic rhinitis: Secondary | ICD-10-CM | POA: Diagnosis not present

## 2020-02-12 DIAGNOSIS — K219 Gastro-esophageal reflux disease without esophagitis: Secondary | ICD-10-CM

## 2020-02-12 DIAGNOSIS — M25519 Pain in unspecified shoulder: Secondary | ICD-10-CM

## 2020-02-12 DIAGNOSIS — R059 Cough, unspecified: Secondary | ICD-10-CM

## 2020-02-12 NOTE — Patient Instructions (Addendum)
Coughing:  Try to use ipratropium nasal spray 1-2 sprays per nostril twice a day only if needed for nasal drainage.  If your stop the nasal spray and no issues with the coughing:  THEN decrease the omeprazole to 20mg  daily in the mornings.   Perennial and seasonal allergic rhinitis  2019 skin testing was positive to grass, trees, mold, cat, dog, cockroach and dust mites.  Continue environmental control measures.  Continue allergy injections once a week.   Continue Singulair 10mg  daily.  Continue Flonase (fluticasone) nasal spray 1 spray per nostril twice a day as needed for nasal congestion.   Take a daily over the counter antihistamines such as Zyrtec (cetirizine), Claritin (loratadine), Allegra (fexofenadine), or Xyzal (levocetirizine) once a day.   GERD (gastroesophageal reflux disease)  Continue appropriate reflux lifestyle modifications.  Decrease the omeprazole to 20mg  daily in the mornings. No food/drink for 30 minutes afterwards.   Follow up in 4 months or sooner if needed.  Reducing Pollen Exposure . Pollen seasons: trees (spring), grass (summer) and ragweed/weeds (fall). Marland Kitchen Keep windows closed in your home and car to lower pollen exposure.  Susa Simmonds air conditioning in the bedroom and throughout the house if possible.  . Avoid going out in dry windy days - especially early morning. . Pollen counts are highest between 5 - 10 AM and on dry, hot and windy days.  . Save outside activities for late afternoon or after a heavy rain, when pollen levels are lower.  . Avoid mowing of grass if you have grass pollen allergy. Marland Kitchen Be aware that pollen can also be transported indoors on people and pets.  . Dry your clothes in an automatic dryer rather than hanging them outside where they might collect pollen.  . Rinse hair and eyes before bedtime. Mold Control . Mold and fungi can grow on a variety of surfaces provided certain temperature and moisture conditions exist.  . Outdoor  molds grow on plants, decaying vegetation and soil. The major outdoor mold, Alternaria and Cladosporium, are found in very high numbers during hot and dry conditions. Generally, a late summer - fall peak is seen for common outdoor fungal spores. Rain will temporarily lower outdoor mold spore count, but counts rise rapidly when the rainy period ends. . The most important indoor molds are Aspergillus and Penicillium. Dark, humid and poorly ventilated basements are ideal sites for mold growth. The next most common sites of mold growth are the bathroom and the kitchen. Outdoor (Seasonal) Mold Control . Use air conditioning and keep windows closed. . Avoid exposure to decaying vegetation. Marland Kitchen Avoid leaf raking. . Avoid grain handling. . Consider wearing a face mask if working in moldy areas.  Indoor (Perennial) Mold Control  . Maintain humidity below 50%. . Get rid of mold growth on hard surfaces with water, detergent and, if necessary, 5% bleach (do not mix with other cleaners). Then dry the area completely. If mold covers an area more than 10 square feet, consider hiring an indoor environmental professional. . For clothing, washing with soap and water is best. If moldy items cannot be cleaned and dried, throw them away. . Remove sources e.g. contaminated carpets. . Repair and seal leaking roofs or pipes. Using dehumidifiers in damp basements may be helpful, but empty the water and clean units regularly to prevent mildew from forming. All rooms, especially basements, bathrooms and kitchens, require ventilation and cleaning to deter mold and mildew growth. Avoid carpeting on concrete or damp floors, and storing items  in damp areas. Pet Allergen Avoidance: . Contrary to popular opinion, there are no "hypoallergenic" breeds of dogs or cats. That is because people are not allergic to an animal's hair, but to an allergen found in the animal's saliva, dander (dead skin flakes) or urine. Pet allergy symptoms  typically occur within minutes. For some people, symptoms can build up and become most severe 8 to 12 hours after contact with the animal. People with severe allergies can experience reactions in public places if dander has been transported on the pet owners' clothing. Marland Kitchen Keeping an animal outdoors is only a partial solution, since homes with pets in the yard still have higher concentrations of animal allergens. . Before getting a pet, ask your allergist to determine if you are allergic to animals. If your pet is already considered part of your family, try to minimize contact and keep the pet out of the bedroom and other rooms where you spend a great deal of time. . As with dust mites, vacuum carpets often or replace carpet with a hardwood floor, tile or linoleum. . High-efficiency particulate air (HEPA) cleaners can reduce allergen levels over time. . While dander and saliva are the source of cat and dog allergens, urine is the source of allergens from rabbits, hamsters, mice and [place] pigs; so ask a non-allergic family member to clean the animal's cage. . If you have a pet allergy, talk to your allergist about the potential for allergy immunotherapy (allergy shots). This strategy can often provide long-term relief. Cockroach Allergen Avoidance Cockroaches are often found in the homes of densely populated urban areas, schools or commercial buildings, but these creatures can lurk almost anywhere. This does not mean that you have a dirty house or living area. . Block all areas where roaches can enter the home. This includes crevices, wall cracks and windows.  . Cockroaches need water to survive, so fix and seal all leaky faucets and pipes. Have an exterminator go through the house when your family and pets are gone to eliminate any remaining roaches. Marland Kitchen Keep food in lidded containers and put pet food dishes away after your pets are done eating. Vacuum and sweep the floor after meals, and take out garbage  and recyclables. Use lidded garbage containers in the kitchen. Wash dishes immediately after use and clean under stoves, refrigerators or toasters where crumbs can accumulate. Wipe off the stove and other kitchen surfaces and cupboards regularly. Control of House Dust Mite Allergen . Dust mite allergens are a common trigger of allergy and asthma symptoms. While they can be found throughout the house, these microscopic creatures thrive in warm, humid environments such as bedding, upholstered furniture and carpeting. . Because so much time is spent in the bedroom, it is essential to reduce mite levels there.  . Encase pillows, mattresses, and box springs in special allergen-proof fabric covers or airtight, zippered plastic covers.  . Bedding should be washed weekly in hot water (130 F) and dried in a hot dryer. Allergen-proof covers are available for comforters and pillows that can't be regularly washed.  Wendee Copp the allergy-proof covers every few months. Minimize clutter in the bedroom. Keep pets out of the bedroom.  Marland Kitchen Keep humidity less than 50% by using a dehumidifier or air conditioning. You can buy a humidity measuring device called a hygrometer to monitor this.  . If possible, replace carpets with hardwood, linoleum, or washable area rugs. If that's not possible, vacuum frequently with a vacuum that has a HEPA filter. Marland Kitchen  Remove all upholstered furniture and non-washable window drapes from the bedroom. . Remove all non-washable stuffed toys from the bedroom.  Wash stuffed toys weekly.  Gastroesophageal Reflux Disease, Adult Gastroesophageal reflux (GER) happens when acid from the stomach flows up into the tube that connects the mouth and the stomach (esophagus). Normally, food travels down the esophagus and stays in the stomach to be digested. With GER, food and stomach acid sometimes move back up into the esophagus. You may have a disease called gastroesophageal reflux disease (GERD) if the  reflux:  Happens often.  Causes frequent or very bad symptoms.  Causes problems such as damage to the esophagus. When this happens, the esophagus becomes sore and swollen (inflamed). Over time, GERD can make small holes (ulcers) in the lining of the esophagus. What are the causes? This condition is caused by a problem with the muscle between the esophagus and the stomach. When this muscle is weak or not normal, it does not close properly to keep food and acid from coming back up from the stomach. The muscle can be weak because of:  Tobacco use.  Pregnancy.  Having a certain type of hernia (hiatal hernia).  Alcohol use.  Certain foods and drinks, such as coffee, chocolate, onions, and peppermint. What increases the risk? You are more likely to develop this condition if you:  Are overweight.  Have a disease that affects your connective tissue.  Use NSAID medicines. What are the signs or symptoms? Symptoms of this condition include:  Heartburn.  Difficult or painful swallowing.  The feeling of having a lump in the throat.  A bitter taste in the mouth.  Bad breath.  Having a lot of saliva.  Having an upset or bloated stomach.  Belching.  Chest pain. Different conditions can cause chest pain. Make sure you see your doctor if you have chest pain.  Shortness of breath or noisy breathing (wheezing).  Ongoing (chronic) cough or a cough at night.  Wearing away of the surface of teeth (tooth enamel).  Weight loss. How is this treated? Treatment will depend on how bad your symptoms are. Your doctor may suggest:  Changes to your diet.  Medicine.  Surgery. Follow these instructions at home: Eating and drinking   Follow a diet as told by your doctor. You may need to avoid foods and drinks such as: ? Coffee and tea (with or without caffeine). ? Drinks that contain alcohol. ? Energy drinks and sports drinks. ? Bubbly (carbonated) drinks or sodas. ? Chocolate  and cocoa. ? Peppermint and mint flavorings. ? Garlic and onions. ? Horseradish. ? Spicy and acidic foods. These include peppers, chili powder, curry powder, vinegar, hot sauces, and BBQ sauce. ? Citrus fruit juices and citrus fruits, such as oranges, lemons, and limes. ? Tomato-based foods. These include red sauce, chili, salsa, and pizza with red sauce. ? Fried and fatty foods. These include donuts, french fries, potato chips, and high-fat dressings. ? High-fat meats. These include hot dogs, rib eye steak, sausage, ham, and bacon. ? High-fat dairy items, such as whole milk, butter, and cream cheese.  Eat small meals often. Avoid eating large meals.  Avoid drinking large amounts of liquid with your meals.  Avoid eating meals during the 2-3 hours before bedtime.  Avoid lying down right after you eat.  Do not exercise right after you eat. Lifestyle   Do not use any products that contain nicotine or tobacco. These include cigarettes, e-cigarettes, and chewing tobacco. If you need help  quitting, ask your doctor.  Try to lower your stress. If you need help doing this, ask your doctor.  If you are overweight, lose an amount of weight that is healthy for you. Ask your doctor about a safe weight loss goal. General instructions  Pay attention to any changes in your symptoms.  Take over-the-counter and prescription medicines only as told by your doctor. Do not take aspirin, ibuprofen, or other NSAIDs unless your doctor says it is okay.  Wear loose clothes. Do not wear anything tight around your waist.  Raise (elevate) the head of your bed about 6 inches (15 cm).  Avoid bending over if this makes your symptoms worse.  Keep all follow-up visits as told by your doctor. This is important. Contact a doctor if:  You have new symptoms.  You lose weight and you do not know why.  You have trouble swallowing or it hurts to swallow.  You have wheezing or a cough that keeps  happening.  Your symptoms do not get better with treatment.  You have a hoarse voice. Get help right away if:  You have pain in your arms, neck, jaw, teeth, or back.  You feel sweaty, dizzy, or light-headed.  You have chest pain or shortness of breath.  You throw up (vomit) and your throw-up looks like blood or coffee grounds.  You pass out (faint).  Your poop (stool) is bloody or black.  You cannot swallow, drink, or eat. Summary  If a person has gastroesophageal reflux disease (GERD), food and stomach acid move back up into the esophagus and cause symptoms or problems such as damage to the esophagus.  Treatment will depend on how bad your symptoms are.  Follow a diet as told by your doctor.  Take all medicines only as told by your doctor. This information is not intended to replace advice given to you by your health care provider. Make sure you discuss any questions you have with your health care provider. Document Revised: 07/05/2017 Document Reviewed: 07/05/2017 Elsevier Patient Education  [place].

## 2020-02-12 NOTE — Assessment & Plan Note (Signed)
Coughing resolved with adding on ipratropium nasal spray, Singulair and increased Protonix. His allergies are the best they've been. He hasn't tried to wean off any of these medications as he is afraid the coughing will return.  Try to use ipratropium nasal spray 1-2 sprays per nostril twice a day only if needed for nasal drainage.  If you stop the nasal spray and no issues with the coughing:  THEN decrease the omeprazole to 20mg  daily in the mornings.

## 2020-02-12 NOTE — Assessment & Plan Note (Signed)
Declines any symptoms.  Continue appropriate reflux lifestyle modifications.  Decrease the omeprazole to 20mg  daily in the mornings as instructed above. No food/drink for 30 minutes afterwards.

## 2020-02-12 NOTE — Assessment & Plan Note (Signed)
Past history - 2019 skin testing was positive to grass, trees, mold, cat, dog, cockroach and dust mites. Started AIT on 11/12/2018 (G-T-DM-D & M-C-CR). Interim history - now back on weekly injections and with above medication changes his allergies are under best control they've been in awhile.   Continue environmental control measures.  Continue allergy injections once a week.   Continue Singulair 10mg  daily.  Continue Flonase (fluticasone) nasal spray 1 spray per nostril twice a day as needed for nasal congestion.   Take a daily over the counter antihistamines such as Zyrtec (cetirizine), Claritin (loratadine), Allegra (fexofenadine), or Xyzal (levocetirizine) once a day.   Will try to wean off ipratropium nasal spray as above.

## 2020-02-13 ENCOUNTER — Ambulatory Visit (INDEPENDENT_AMBULATORY_CARE_PROVIDER_SITE_OTHER): Payer: PPO

## 2020-02-13 VITALS — BP 132/60 | HR 64 | Temp 97.9°F | Ht 70.0 in | Wt 243.4 lb

## 2020-02-13 DIAGNOSIS — Z0000 Encounter for general adult medical examination without abnormal findings: Secondary | ICD-10-CM | POA: Diagnosis not present

## 2020-02-13 DIAGNOSIS — Z23 Encounter for immunization: Secondary | ICD-10-CM | POA: Diagnosis not present

## 2020-02-13 NOTE — Progress Notes (Signed)
Subjective:   Marcus Gomez. is a 75 y.o. male who presents for an Initial Medicare Annual Wellness Visit.  Review of Systems    N/A  Cardiac Risk Factors include: advanced age (>76men, >72 women);male gender;dyslipidemia     Objective:    Today's Vitals   02/13/20 1032  BP: 132/60  Pulse: 64  Temp: 97.9 F (36.6 C)  TempSrc: Oral  SpO2: 97%  Weight: 243 lb 7 oz (110.4 kg)  Height: 5\' 10"  (1.778 m)   Body mass index is 34.93 kg/m.  Advanced Directives 02/13/2020 09/28/2014  Does Patient Have a Medical Advance Directive? Yes Yes  Type of Paramedic of [place];Living will Living will;Healthcare Power of [place];Out of facility DNR (pink MOST or yellow form)  Does patient want to make changes to medical advance directive? No - Patient declined -  Copy of [place] in Chart? No - copy requested No - copy requested    Current Medications (verified) Outpatient Encounter Medications as of 02/13/2020  Medication Sig  . fexofenadine (ALLEGRA) 180 MG tablet Take 180 mg by mouth daily as needed for allergies or rhinitis.  . fluticasone (FLONASE) 50 MCG/ACT nasal spray Place 1 spray into both nostrils in the morning and at bedtime. For nasal congestion  . losartan (COZAAR) 50 MG tablet Take 1 tablet (50 mg total) by mouth daily.  . montelukast (SINGULAIR) 10 MG tablet Take 1 tablet (10 mg total) by mouth at bedtime.  . Multiple Vitamins-Minerals (PRESERVISION AREDS 2+MULTI VIT PO) Take 1 capsule by mouth in the morning and at bedtime.  Marland Kitchen omeprazole (PRILOSEC) 40 MG capsule Take 40mg  daily 30 minutes before breakfast.  . simvastatin (ZOCOR) 10 MG tablet Take 1 tablet (10 mg total) by mouth 2 (two) times a week.  . Turmeric (QC TUMERIC COMPLEX PO) Take 1,000 mg by mouth in the morning and at bedtime.  Marland Kitchen EPINEPHrine 0.3 mg/0.3 mL IJ SOAJ injection Inject 0.3 mLs (0.3 mg total) into the muscle once as needed for up to 1 dose for  anaphylaxis. (Patient not taking: Reported on 02/13/2020)  . ipratropium (ATROVENT) 0.03 % nasal spray Place 2 sprays into both nostrils every 12 (twelve) hours. For drainage (Patient not taking: Reported on 02/13/2020)  . Multiple Vitamins-Minerals (ONE-A-DAY MENS 50+ PO) Take by mouth. (Patient not taking: No sig reported)   No facility-administered encounter medications on file as of 02/13/2020.    Allergies (verified) Lisinopril   History: Past Medical History:  Diagnosis Date  . Essential hypertension   . Prostate cancer ([place])    Remission   Past Surgical History:  Procedure Laterality Date  . KNEE SURGERY Bilateral 2015 & 2014  . SHOULDER SURGERY Right 2016   Family History  Problem Relation Age of Onset  . Stroke Mother   . Allergies Father   . COPD Father        never smoker  . Rheum arthritis Father   . Allergic rhinitis Neg Hx   . Angioedema Neg Hx   . Asthma Neg Hx   . Eczema Neg Hx   . Immunodeficiency Neg Hx   . Urticaria Neg Hx    Social History   Socioeconomic History  . Marital status: Divorced    Spouse name: Not on file  . Number of children: Not on file  . Years of education: Not on file  . Highest education level: Not on file  Occupational History  . Not on file  Tobacco  Use  . Smoking status: Never Smoker  . Smokeless tobacco: Never Used  Vaping Use  . Vaping Use: Never used  Substance and Sexual Activity  . Alcohol use: Yes    Comment: socially   . Drug use: No  . Sexual activity: Yes  Other Topics Concern  . 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       Social Determinants of Health   Financial Resource Strain: Low Risk   . Difficulty of Paying Living Expenses: Not hard at all  Food Insecurity: No Food Insecurity  . Worried About Charity fundraiser in the Last Year: Never true  . Ran Out of Food in the Last Year: Never true  Transportation Needs: No  Transportation Needs  . Lack of Transportation (Medical): No  . Lack of Transportation (Non-Medical): No  Physical Activity: Sufficiently Active  . Days of Exercise per Week: 7 days  . Minutes of Exercise per Session: 30 min  Stress: No Stress Concern Present  . Feeling of Stress : Not at all  Social Connections: Moderately Integrated  . Frequency of Communication with Friends and Family: Never  . Frequency of Social Gatherings with Friends and Family: More than three times a week  . Attends Religious Services: 1 to 4 times per year  . Active Member of Clubs or Organizations: Yes  . Attends Archivist Meetings: More than 4 times per year  . Marital Status: Divorced    Tobacco Counseling Counseling given: Not Answered   Clinical Intake:  Pre-visit preparation completed: Yes  Pain : No/denies pain     Nutritional Status: BMI > 30  Obese Nutritional Risks: None Diabetes: No  How often do you need to have someone help you when you read instructions, pamphlets, or other written materials from your doctor or pharmacy?: 1 - Never What is the last grade level you completed in school?: 2 years of college  Diabetic? No   Interpreter Needed?: No  Information entered by :: [facility] In your present state of health, do you have any difficulty performing the following activities: 02/13/2020  Hearing? N  Vision? N  Difficulty concentrating or making decisions? N  Walking or climbing stairs? Y  Comment has knee pain, and bone spur to foot  Dressing or bathing? N  Doing errands, shopping? N  Preparing Food and eating ? N  Using the Toilet? N  In the past six months, have you accidently leaked urine? N  Do you have problems with loss of bowel control? N  Managing your Medications? N  Managing your Finances? N  Housekeeping or managing your Housekeeping? N  Some recent data might be hidden    Patient Care Team: [name], NP as  PCP - General (Family Medicine) [name], [facility] as Pharmacist (Pharmacist) Ortho, [facility] (Orthopedic Surgery)  Indicate any recent Medical Services you may have received from other than [facility] providers in the past year (date may be approximate).     Assessment:   This is a routine wellness examination for [place].  Hearing/Vision screen  Hearing Screening   125Hz  250Hz  500Hz  1000Hz  2000Hz  3000Hz  4000Hz  6000Hz  8000Hz   Right ear:           Left ear:           Vision Screening Comments: Patient states gets  eyes examined once per year to specialist and to ophthalmologist   Dietary issues and exercise activities discussed: Current Exercise Habits: Home exercise routine, Type of exercise: walking, Time (Minutes): 35, Frequency (Times/Week): 7, Weekly Exercise (Minutes/Week): 245, Intensity: Mild, Exercise limited by: orthopedic condition(s)  Goals    . Patient Stated     I will continue to walk 0.75 mile -1.5 miles per day    . Pharmacy Care Plan     CARE PLAN ENTRY (see longitudinal plan of care for additional care plan information)  Current Barriers:  . Chronic Disease Management support, education, and care coordination needs related to Hypertension, Hyperlipidemia, Allergic Rhinitis, and arthritis   Hypertension BP Readings from Last 3 Encounters:  11/27/19 122/82  09/09/19 130/70  07/24/19 (!) 150/78   . Pharmacist Clinical Goal(s): o Over the next 90 days, patient will work with PharmD and providers to maintain BP goal <140/90 . Current regimen:  o Losartan 50 mg 1 tablet daily . Interventions: o Discussed the importance of checking blood pressure at home while taking blood pressure medications o Discussed DASH eating plan recommendations: . Emphasizes vegetables, fruits, and whole-grains . Includes fat-free or low-fat dairy products, fish, poultry, beans, nuts, and vegetable oils . Limits foods that are high in saturated fat. These foods include fatty meats,  full-fat dairy products, and tropical oils such as coconut, palm kernel, and palm oils. . Limits sugar-sweetened beverages and sweets . Limiting sodium intake to < 1500 mg/day  . Patient self care activities - Over the next 90 days, patient will: o Check blood pressure weekly, document, and provide at future appointments o Ensure daily salt intake < 2300 mg/day  Hyperlipidemia Lab Results  Component Value Date/Time   LDLCALC 114 (H) 07/24/2019 08:23 AM   . Pharmacist Clinical Goal(s): o Over the next 90 days, patient will work with PharmD and providers to achieve LDL goal < 100 . Current regimen:  o Rosuvastatin 5 mg 1 tablet twice weekly . Interventions: o Discussed lowering cholesterol through diet by: Marland Kitchen Limiting foods with cholesterol such as liver and other organ meats, egg yolks, shrimp, and whole milk dairy products . Avoiding saturated fats and trans fats and incorporating healthier fats, such as lean meat, nuts, and unsaturated oils like canola and olive oils . Eating foods with soluble fiber such as whole-grain cereals such as oatmeal and oat bran, fruits such as apples, bananas, oranges, pears, and prunes, legumes such as kidney beans, lentils, chick peas, black-eyed peas, and lima beans, and green leafy vegetables . Limiting alcohol intake  . Patient self care activities - Over the next 90 days, patient will: o Continue current medications  Allergic rhinitis . Pharmacist Clinical Goal(s): o Over the next 90 days, patient will work with PharmD and providers to manage symptoms of allergic rhinitis . Current regimen:  . Flonase 50 mcg/act nasal spray 1 spray in the morning and at bedtime . Ipratropium 0.03% nasal spray 1 spray in each nostril in the morning and bedtime . Montelukast 10 mg 1 tablet daily at bedtime . Allegra 160 mg 1 tablet as needed . Interventions: o Discussed avoiding allergy triggers . Patient self care activities - Over the next 90 days, patient  will: o Continue current medications  Arthritis . Pharmacist Clinical Goal(s) o Over the next 90 days, patient will work with PharmD and providers to manage pain with arthritis . Current regimen:  o Celecoxib 200 mg 1 capsule every other day with food . Interventions: o  Discussed preference of Tylenol over NSAIDs such as Advil or Aleve due to risk of bleeding with Celebrex use o Recommended a trial of diclofenac gel to see if this helps with joint pain . Patient self care activities - Over the next 90 days, patient will: o Continue current medication o Trial diclofenac gel to see if this helps with joint pain  Medication management . Pharmacist Clinical Goal(s): o Over the next 90 days, patient will work with PharmD and providers to maintain optimal medication adherence . Current pharmacy: [facility] . Interventions o Comprehensive medication review performed. o Continue current medication management strategy . Patient self care activities - Over the next 90 days, patient will: o Take medications as prescribed o Report any questions or concerns to PharmD and/or provider(s)  Initial goal documentation       Depression Screen PHQ 2/9 Scores 02/13/2020 01/25/2018 10/24/2017 03/16/2016  PHQ - 2 Score 0 0 0 0    Fall Risk Fall Risk  02/13/2020 08/06/2019 01/25/2018 10/24/2017 03/16/2016  Falls in the past year? 1 1 0 No Yes  Comment - Emmi Telephone Survey: data to providers prior to load - - -  Number falls in past yr: 1 1 - - 2 or more  Comment - Emmi Telephone Survey Actual Response = 1 - - -  Injury with Fall? 0 1 - - No  Risk for fall due to : History of fall(s);Impaired balance/gait - - - Other (Comment)  Follow up Falls evaluation completed;Falls prevention discussed - - - Falls prevention discussed    FALL RISK PREVENTION PERTAINING TO THE HOME:  Any stairs in or around the home? No  If so, are there any without handrails? No  Home free of loose throw rugs in walkways, pet beds,  electrical cords, etc? Yes  Adequate lighting in your home to reduce risk of falls? Yes   ASSISTIVE DEVICES UTILIZED TO PREVENT FALLS:  Life alert? No  Use of a cane, walker or w/c? No  Grab bars in the bathroom? No  Shower chair or bench in shower? No  Elevated toilet seat or a handicapped toilet? No   TIMED UP AND GO:  Was the test performed? Yes .  Length of time to ambulate 10 feet: 3 sec.   Gait steady and fast without use of assistive device  Cognitive Function:        Immunizations Immunization History  Administered Date(s) Administered  . Fluad Quad(high Dose 65+) 02/13/2020  . Influenza, High Dose Seasonal PF 11/03/2017  . Moderna SARS-COV2 Booster Vaccination 12/21/2019  . Moderna Sars-Covid-2 Vaccination 02/22/2019, 03/22/2019  . Pneumococcal Conjugate-13 03/16/2016  . Pneumococcal Polysaccharide-23 01/25/2018    TDAP status: Due, Education has been provided regarding the importance of this vaccine. Advised may receive this vaccine at local pharmacy or Health Dept. Aware to provide a copy of the vaccination record if obtained from local pharmacy or Health Dept. Verbalized acceptance and understanding.  Flu Vaccine status: Completed at today's visit  Pneumococcal vaccine status: Up to date  Covid-19 vaccine status: Completed vaccines  Qualifies for Shingles Vaccine? Yes   Zostavax completed No   Shingrix Completed?: No.    Education has been provided regarding the importance of this vaccine. Patient has been advised to call insurance company to determine out of pocket expense if they have not yet received this vaccine. Advised may also receive vaccine at local pharmacy or Health Dept. Verbalized acceptance and understanding.  Screening Tests Health Maintenance  Topic Date Due  .  Hepatitis C Screening  Never done  . TETANUS/TDAP  Never done  . COLONOSCOPY (Pts 45-53yrs Insurance coverage will need to be confirmed)  01/08/2023  . INFLUENZA VACCINE   Completed  . COVID-19 Vaccine  Completed  . PNA vac Low Risk Adult  Completed    Health Maintenance  Health Maintenance Due  Topic Date Due  . Hepatitis C Screening  Never done  . TETANUS/TDAP  Never done    Colorectal cancer screening: Type of screening: Colonoscopy. Completed 01/07/2013. Repeat every 10 years  Lung Cancer Screening: (Low Dose CT Chest recommended if Age 64-80 years, 30 pack-year currently smoking OR have quit w/in 15years.) does not qualify.   Lung Cancer Screening Referral: N/A   Additional Screening:  Hepatitis C Screening: does qualify;   Vision Screening: Recommended annual ophthalmology exams for early detection of glaucoma and other disorders of the eye. Is the patient up to date with their annual eye exam?  Yes  Who is the provider or what is the name of the office in which the patient attends annual eye exams? Dr. Zigmund Daniel  If pt is not established with a provider, would they like to be referred to a provider to establish care? No .   Dental Screening: Recommended annual dental exams for proper oral hygiene  Community Resource Referral / Chronic Care Management: CRR required this visit?  No   CCM required this visit?  No      Plan:     I have personally reviewed and noted the following in the patient's chart:   . Medical and social history . Use of alcohol, tobacco or illicit drugs  . Current medications and supplements . Functional ability and status . Nutritional status . Physical activity . Advanced directives . List of other physicians . Hospitalizations, surgeries, and ER visits in previous 12 months . Vitals . Screenings to include cognitive, depression, and falls . Referrals and appointments  In addition, I have reviewed and discussed with patient certain preventive protocols, quality metrics, and best practice recommendations. A written personalized care plan for preventive services as well as general preventive health  recommendations were provided to patient.     Marcus Neas, LPN   03/18/7562   Nurse Notes: None

## 2020-02-13 NOTE — Patient Instructions (Signed)
[name] , Thank you for taking time to come for your Medicare Wellness Visit. I appreciate your ongoing commitment to your health goals. Please review the following plan we discussed and let me know if I can assist you in the future.   Screening recommendations/referrals: Colonoscopy: Up to date, next due 01/08/2023 Recommended yearly ophthalmology/optometry visit for glaucoma screening and checkup Recommended yearly dental visit for hygiene and checkup  Vaccinations: Influenza vaccine: Up to date, next due fall 2022  Pneumococcal vaccine: Completed series Tdap vaccine: Currently due, if you wish to receive we recommend that you receive at your pharmacy. Shingles vaccine: Currently due, if you wish to receive we recommend that you receive at your pharmacy     Advanced directives: Please bring copies of your advanced medical directives into our office so that we may scan them into your chart.  Conditions/risks identified: None   Next appointment: 05/13/2020 @ 4:00 PM with Pharmacist at [facility] 65 Years and Older, Male Preventive care refers to lifestyle choices and visits with your health care provider that can promote health and wellness. What does preventive care include?  A yearly physical exam. This is also called an annual well check.  Dental exams once or twice a year.  Routine eye exams. Ask your health care provider how often you should have your eyes checked.  Personal lifestyle choices, including:  Daily care of your teeth and gums.  Regular physical activity.  Eating a healthy diet.  Avoiding tobacco and drug use.  Limiting alcohol use.  Practicing safe sex.  Taking low doses of aspirin every day.  Taking vitamin and mineral supplements as recommended by your health care provider. What happens during an annual well check? The services and screenings done by your health care provider during your annual well check will depend  on your age, overall health, lifestyle risk factors, and family history of disease. Counseling  Your health care provider may ask you questions about your:  Alcohol use.  Tobacco use.  Drug use.  Emotional well-being.  Home and relationship well-being.  Sexual activity.  Eating habits.  History of falls.  Memory and ability to understand (cognition).  Work and work Statistician. Screening  You may have the following tests or measurements:  Height, weight, and BMI.  Blood pressure.  Lipid and cholesterol levels. These may be checked every 5 years, or more frequently if you are over 61 years old.  Skin check.  Lung cancer screening. You may have this screening every year starting at age 79 if you have a 30-pack-year history of smoking and currently smoke or have quit within the past 15 years.  Fecal occult blood test (FOBT) of the stool. You may have this test every year starting at age 89.  Flexible sigmoidoscopy or colonoscopy. You may have a sigmoidoscopy every 5 years or a colonoscopy every 10 years starting at age 73.  Prostate cancer screening. Recommendations will vary depending on your family history and other risks.  Hepatitis C blood test.  Hepatitis B blood test.  Sexually transmitted disease (STD) testing.  Diabetes screening. This is done by checking your blood sugar (glucose) after you have not eaten for a while (fasting). You may have this done every 1-3 years.  Abdominal aortic aneurysm (AAA) screening. You may need this if you are a current or former smoker.  Osteoporosis. You may be screened starting at age 40 if you are at high risk. Talk with your health care provider  about your test results, treatment options, and if necessary, the need for more tests. Vaccines  Your health care provider may recommend certain vaccines, such as:  Influenza vaccine. This is recommended every year.  Tetanus, diphtheria, and acellular pertussis (Tdap, Td)  vaccine. You may need a Td booster every 10 years.  Zoster vaccine. You may need this after age 46.  Pneumococcal 13-valent conjugate (PCV13) vaccine. One dose is recommended after age 53.  Pneumococcal polysaccharide (PPSV23) vaccine. One dose is recommended after age 65. Talk to your health care provider about which screenings and vaccines you need and how often you need them. This information is not intended to replace advice given to you by your health care provider. Make sure you discuss any questions you have with your health care provider. Document Released: 01/23/2015 Document Revised: 09/16/2015 Document Reviewed: 10/28/2014 Elsevier Interactive Patient Education  2017 [employer] Prevention in the Home Falls can cause injuries. They can happen to people of all ages. There are many things you can do to make your home safe and to help prevent falls. What can I do on the outside of my home?  Regularly fix the edges of walkways and driveways and fix any cracks.  Remove anything that might make you trip as you walk through a door, such as a raised step or threshold.  Trim any bushes or trees on the path to your home.  Use bright outdoor lighting.  Clear any walking paths of anything that might make someone trip, such as rocks or tools.  Regularly check to see if handrails are loose or broken. Make sure that both sides of any steps have handrails.  Any raised decks and porches should have guardrails on the edges.  Have any leaves, snow, or ice cleared regularly.  Use sand or salt on walking paths during winter.  Clean up any spills in your garage right away. This includes oil or grease spills. What can I do in the bathroom?  Use night lights.  Install grab bars by the toilet and in the tub and shower. Do not use towel bars as grab bars.  Use non-skid mats or decals in the tub or shower.  If you need to sit down in the shower, use a plastic, non-slip  stool.  Keep the floor dry. Clean up any water that spills on the floor as soon as it happens.  Remove soap buildup in the tub or shower regularly.  Attach bath mats securely with double-sided non-slip rug tape.  Do not have throw rugs and other things on the floor that can make you trip. What can I do in the bedroom?  Use night lights.  Make sure that you have a light by your bed that is easy to reach.  Do not use any sheets or blankets that are too big for your bed. They should not hang down onto the floor.  Have a firm chair that has side arms. You can use this for support while you get dressed.  Do not have throw rugs and other things on the floor that can make you trip. What can I do in the kitchen?  Clean up any spills right away.  Avoid walking on wet floors.  Keep items that you use a lot in easy-to-reach places.  If you need to reach something above you, use a strong step stool that has a grab bar.  Keep electrical cords out of the way.  Do not use floor polish or  wax that makes floors slippery. If you must use wax, use non-skid floor wax.  Do not have throw rugs and other things on the floor that can make you trip. What can I do with my stairs?  Do not leave any items on the stairs.  Make sure that there are handrails on both sides of the stairs and use them. Fix handrails that are broken or loose. Make sure that handrails are as long as the stairways.  Check any carpeting to make sure that it is firmly attached to the stairs. Fix any carpet that is loose or worn.  Avoid having throw rugs at the top or bottom of the stairs. If you do have throw rugs, attach them to the floor with carpet tape.  Make sure that you have a light switch at the top of the stairs and the bottom of the stairs. If you do not have them, ask someone to add them for you. What else can I do to help prevent falls?  Wear shoes that:  Do not have high heels.  Have rubber bottoms.  Are  comfortable and fit you well.  Are closed at the toe. Do not wear sandals.  If you use a stepladder:  Make sure that it is fully opened. Do not climb a closed stepladder.  Make sure that both sides of the stepladder are locked into place.  Ask someone to hold it for you, if possible.  Clearly mark and make sure that you can see:  Any grab bars or handrails.  First and last steps.  Where the edge of each step is.  Use tools that help you move around (mobility aids) if they are needed. These include:  Canes.  Walkers.  Scooters.  Crutches.  Turn on the lights when you go into a dark area. Replace any light bulbs as soon as they burn out.  Set up your furniture so you have a clear path. Avoid moving your furniture around.  If any of your floors are uneven, fix them.  If there are any pets around you, be aware of where they are.  Review your medicines with your doctor. Some medicines can make you feel dizzy. This can increase your chance of falling. Ask your doctor what other things that you can do to help prevent falls. This information is not intended to replace advice given to you by your health care provider. Make sure you discuss any questions you have with your health care provider. Document Released: 10/23/2008 Document Revised: 06/04/2015 Document Reviewed: 01/31/2014 Elsevier Interactive Patient Education  2017 [employer].

## 2020-02-17 DIAGNOSIS — M7661 Achilles tendinitis, right leg: Secondary | ICD-10-CM | POA: Diagnosis not present

## 2020-02-18 ENCOUNTER — Ambulatory Visit (INDEPENDENT_AMBULATORY_CARE_PROVIDER_SITE_OTHER): Payer: PPO | Admitting: *Deleted

## 2020-02-18 DIAGNOSIS — J309 Allergic rhinitis, unspecified: Secondary | ICD-10-CM | POA: Diagnosis not present

## 2020-02-20 DIAGNOSIS — M7661 Achilles tendinitis, right leg: Secondary | ICD-10-CM | POA: Diagnosis not present

## 2020-02-24 DIAGNOSIS — J3089 Other allergic rhinitis: Secondary | ICD-10-CM | POA: Diagnosis not present

## 2020-02-24 NOTE — Progress Notes (Signed)
VIALS EXP 02-23-21 

## 2020-02-25 DIAGNOSIS — M7661 Achilles tendinitis, right leg: Secondary | ICD-10-CM | POA: Diagnosis not present

## 2020-02-25 DIAGNOSIS — J3081 Allergic rhinitis due to animal (cat) (dog) hair and dander: Secondary | ICD-10-CM | POA: Diagnosis not present

## 2020-02-26 ENCOUNTER — Ambulatory Visit (INDEPENDENT_AMBULATORY_CARE_PROVIDER_SITE_OTHER): Payer: PPO | Admitting: *Deleted

## 2020-02-26 DIAGNOSIS — J309 Allergic rhinitis, unspecified: Secondary | ICD-10-CM | POA: Diagnosis not present

## 2020-02-28 DIAGNOSIS — M7661 Achilles tendinitis, right leg: Secondary | ICD-10-CM | POA: Diagnosis not present

## 2020-03-02 DIAGNOSIS — M7661 Achilles tendinitis, right leg: Secondary | ICD-10-CM | POA: Diagnosis not present

## 2020-03-04 ENCOUNTER — Ambulatory Visit (INDEPENDENT_AMBULATORY_CARE_PROVIDER_SITE_OTHER): Payer: PPO

## 2020-03-04 DIAGNOSIS — J309 Allergic rhinitis, unspecified: Secondary | ICD-10-CM | POA: Diagnosis not present

## 2020-03-06 DIAGNOSIS — M7661 Achilles tendinitis, right leg: Secondary | ICD-10-CM | POA: Diagnosis not present

## 2020-03-09 DIAGNOSIS — M7661 Achilles tendinitis, right leg: Secondary | ICD-10-CM | POA: Diagnosis not present

## 2020-03-12 ENCOUNTER — Ambulatory Visit (INDEPENDENT_AMBULATORY_CARE_PROVIDER_SITE_OTHER): Payer: PPO

## 2020-03-12 DIAGNOSIS — J309 Allergic rhinitis, unspecified: Secondary | ICD-10-CM

## 2020-03-12 DIAGNOSIS — M7661 Achilles tendinitis, right leg: Secondary | ICD-10-CM | POA: Diagnosis not present

## 2020-03-16 ENCOUNTER — Telehealth: Payer: Self-pay

## 2020-03-16 MED ORDER — AMOXICILLIN-POT CLAVULANATE 875-125 MG PO TABS: 1.0000 | ORAL_TABLET | Freq: Two times a day (BID) | ORAL | 0 refills | Status: DC

## 2020-03-16 MED ORDER — PREDNISONE 10 MG PO TABS: ORAL_TABLET | ORAL | 0 refills | Status: DC

## 2020-03-16 NOTE — Telephone Encounter (Signed)
Please call patient.   Start prednisone taper. Prednisone 10mg  tablets - take 2 tablets for 4 days then 1 tablet on day 5.   Start Augmentin 1 tablet twice a day for 10 days.  Continue with all other medications.

## 2020-03-16 NOTE — Telephone Encounter (Signed)
Patient called stating he has a sinus infection. Patient states he is blowing out yellow and has a lot of nasal congestion. Symptoms started about a week ago and have gotten worse.   Patient is taking his allegra, nasal flush twice/day, Flonase, Prilosec, Singulair at night and a Mucinex at night.    [place] [address]

## 2020-03-16 NOTE — Telephone Encounter (Signed)
Please advise on what else patient can take to help with his symptoms. Thank you

## 2020-03-16 NOTE — Telephone Encounter (Signed)
Called patient and advised. Patient verbalized understanding.  

## 2020-03-19 ENCOUNTER — Ambulatory Visit (INDEPENDENT_AMBULATORY_CARE_PROVIDER_SITE_OTHER): Payer: PPO

## 2020-03-19 DIAGNOSIS — J309 Allergic rhinitis, unspecified: Secondary | ICD-10-CM | POA: Diagnosis not present

## 2020-03-25 ENCOUNTER — Encounter: Payer: Self-pay | Admitting: Orthopedic Surgery

## 2020-03-25 NOTE — Progress Notes (Signed)
Office Visit Note   Patient: Marcus Gomez.           Date of Birth: [birth year]           MRN: [id number] Visit Date: 02/12/2020 Requested by: [name], NP [address],  [place] [id number] PCP: [name], NP  Subjective: Chief Complaint  Patient presents with  . Right Shoulder - Pain    HPI: Marcus Gomez is a 75 year old patient with long history of right shoulder pain.  States he has pain most days with activities of daily living.  Has some stiffness and coarseness in the shoulder but in general it something that he has been managing with for fairly long time.              ROS: All systems reviewed are negative as they relate to the chief complaint within the history of present illness.  Patient denies  fevers or chills.   Assessment & Plan: Visit Diagnoses:  1. Shoulder pain, unspecified chronicity, unspecified laterality     Plan: Impression is right shoulder arthritis which is fairly significant radiographically but clinically the patient is managing with symptoms that do not quite warrant surgical intervention.  We discussed other interventions such as anti-inflammatories and injections.  Patient does have COPD which also is a significant medical comorbidity.  If he ever does come to surgery we would have to take that into consideration with the interscalene nerve block.  He is going to consider his options.  I think when his symptoms are severe and debilitating we could consider shoulder replacement.  Follow-Up Instructions: Return if symptoms worsen or fail to improve.   Orders:  Orders Placed This Encounter  Procedures  . XR Shoulder Right   No orders of the defined types were placed in this encounter.     Procedures: No procedures performed   Clinical Data: No additional findings.  Objective: Vital Signs: There were no vitals taken for this visit.  Physical Exam:   Constitutional: Patient appears well-developed HEENT:   Head: Normocephalic Eyes:EOM are normal Neck: Normal range of motion Cardiovascular: Normal rate Pulmonary/chest: Effort normal Neurologic: Patient is alert Skin: Skin is warm Psychiatric: Patient has normal mood and affect    Ortho Exam: Ortho exam demonstrates full range of motion of the wrist and elbows bilaterally.  EPL FPL interosseous function intact.  Radial pulses intact.  Does have some coarse grinding with passive range of motion of that right shoulder consistent with his known history of arthritis.  Rotator cuff strength is good infraspinatus supraspinatus and subscap muscle testing.  Passive range of motion is predictably restricted in external rotation.  No AC joint tenderness to direct palpation.  Specialty Comments:  No specialty comments available.  Imaging: No results found.   PMFS History: Patient Active Problem List   Diagnosis Date Noted  . GERD (gastroesophageal reflux disease) 07/24/2017  . Perennial and seasonal allergic rhinitis 03/13/2017  . Coughing 03/13/2017  . Lung field abnormal finding on examination 08/31/2015  . Essential hypertension 08/31/2015   Past Medical History:  Diagnosis Date  . Essential hypertension   . Prostate cancer ([place])    Remission    Family History  Problem Relation Age of Onset  . Stroke Mother   . Allergies Father   . COPD Father        never smoker  . Rheum arthritis Father   . Allergic rhinitis Neg Hx   . Angioedema Neg Hx   . Asthma  Neg Hx   . Eczema Neg Hx   . Immunodeficiency Neg Hx   . Urticaria Neg Hx     Past Surgical History:  Procedure Laterality Date  . KNEE SURGERY Bilateral 2015 & 2014  . SHOULDER SURGERY Right 2016   Social History   Occupational History  . Not on file  Tobacco Use  . Smoking status: Never Smoker  . Smokeless tobacco: Never Used  Vaping Use  . Vaping Use: Never used  Substance and Sexual Activity  . Alcohol use: Yes    Comment: socially   . Drug use: No  . Sexual  activity: Yes

## 2020-03-26 ENCOUNTER — Ambulatory Visit (INDEPENDENT_AMBULATORY_CARE_PROVIDER_SITE_OTHER): Payer: PPO | Admitting: *Deleted

## 2020-03-26 DIAGNOSIS — J309 Allergic rhinitis, unspecified: Secondary | ICD-10-CM | POA: Diagnosis not present

## 2020-03-31 ENCOUNTER — Ambulatory Visit: Payer: PPO | Admitting: Allergy and Immunology

## 2020-04-01 ENCOUNTER — Ambulatory Visit (INDEPENDENT_AMBULATORY_CARE_PROVIDER_SITE_OTHER): Payer: PPO

## 2020-04-01 DIAGNOSIS — M898X7 Other specified disorders of bone, ankle and foot: Secondary | ICD-10-CM | POA: Diagnosis not present

## 2020-04-01 DIAGNOSIS — J309 Allergic rhinitis, unspecified: Secondary | ICD-10-CM

## 2020-04-01 DIAGNOSIS — M7661 Achilles tendinitis, right leg: Secondary | ICD-10-CM | POA: Diagnosis not present

## 2020-04-02 ENCOUNTER — Telehealth: Payer: Self-pay | Admitting: Pharmacist

## 2020-04-02 NOTE — Chronic Care Management (AMB) (Signed)
    Chronic Care Management Pharmacy Assistant   Name: Marcus Gomez.  MRN: [id number] DOB: July 23, 1945  Reason for Encounter: Disease State  Recent office visits:  None  Recent consult visits:  . 02.02.2022 Meredith Pel, MD Orthopedic Surgery . 02.02.2022 Kim, [place] [facility] visits:  None in previous 6 months  Medications: Outpatient Encounter Medications as of 04/02/2020  Medication Sig  . amoxicillin-clavulanate (AUGMENTIN) 875-125 MG tablet Take 1 tablet by mouth 2 (two) times daily.  Marland Kitchen EPINEPHrine 0.3 mg/0.3 mL IJ SOAJ injection Inject 0.3 mLs (0.3 mg total) into the muscle once as needed for up to 1 dose for anaphylaxis. (Patient not taking: Reported on 02/13/2020)  . fexofenadine (ALLEGRA) 180 MG tablet Take 180 mg by mouth daily as needed for allergies or rhinitis.  . fluticasone (FLONASE) 50 MCG/ACT nasal spray Place 1 spray into both nostrils in the morning and at bedtime. For nasal congestion  . ipratropium (ATROVENT) 0.03 % nasal spray Place 2 sprays into both nostrils every 12 (twelve) hours. For drainage (Patient not taking: Reported on 02/13/2020)  . losartan (COZAAR) 50 MG tablet Take 1 tablet (50 mg total) by mouth daily.  . montelukast (SINGULAIR) 10 MG tablet Take 1 tablet (10 mg total) by mouth at bedtime.  . Multiple Vitamins-Minerals (ONE-A-DAY MENS 50+ PO) Take by mouth. (Patient not taking: No sig reported)  . Multiple Vitamins-Minerals (PRESERVISION AREDS 2+MULTI VIT PO) Take 1 capsule by mouth in the morning and at bedtime.  Marland Kitchen omeprazole (PRILOSEC) 40 MG capsule Take 40mg  daily 30 minutes before breakfast.  . predniSONE (DELTASONE) 10 MG tablet Prednisone 10mg  tablets - take 2 tablets for 4 days then 1 tablet on day 5.  . simvastatin (ZOCOR) 10 MG tablet Take 1 tablet (10 mg total) by mouth 2 (two) times a week.  . Turmeric (QC TUMERIC COMPLEX PO) Take 1,000 mg by mouth in the morning and at bedtime.   No facility-administered  encounter medications on file as of 04/02/2020.   I spoke with the patient and discussed medication adherence. He does not have an issue currently with his current medication. He stated that he has not been taking his blood pressure at home since his last doctor's visit. He was encouraged to take his blood pressure at home at least once a week. He understood. He denies any ED visits since his last CPP follow-up. There have been no changes to his medications. A new physical was scheduled with his PCP for July. The patient denies any side effects from his current medications. He also denies any problems with his current pharmacy.   Star Rating Drugs:  Maia Breslow, [place] Assistant [phone]

## 2020-04-08 ENCOUNTER — Ambulatory Visit (INDEPENDENT_AMBULATORY_CARE_PROVIDER_SITE_OTHER): Payer: PPO | Admitting: *Deleted

## 2020-04-08 DIAGNOSIS — J309 Allergic rhinitis, unspecified: Secondary | ICD-10-CM

## 2020-04-09 ENCOUNTER — Telehealth: Payer: Self-pay | Admitting: Adult Health

## 2020-04-09 ENCOUNTER — Ambulatory Visit (INDEPENDENT_AMBULATORY_CARE_PROVIDER_SITE_OTHER): Payer: PPO | Admitting: Family Medicine

## 2020-04-09 ENCOUNTER — Other Ambulatory Visit: Payer: Self-pay

## 2020-04-09 ENCOUNTER — Encounter: Payer: Self-pay | Admitting: Family Medicine

## 2020-04-09 VITALS — BP 160/80 | HR 65 | Temp 97.6°F | Resp 12 | Ht 70.0 in | Wt 245.6 lb

## 2020-04-09 DIAGNOSIS — J019 Acute sinusitis, unspecified: Secondary | ICD-10-CM | POA: Diagnosis not present

## 2020-04-09 DIAGNOSIS — K219 Gastro-esophageal reflux disease without esophagitis: Secondary | ICD-10-CM | POA: Diagnosis not present

## 2020-04-09 DIAGNOSIS — H1013 Acute atopic conjunctivitis, bilateral: Secondary | ICD-10-CM | POA: Diagnosis not present

## 2020-04-09 DIAGNOSIS — J3089 Other allergic rhinitis: Secondary | ICD-10-CM

## 2020-04-09 DIAGNOSIS — R062 Wheezing: Secondary | ICD-10-CM | POA: Diagnosis not present

## 2020-04-09 MED ORDER — DOXYCYCLINE HYCLATE 100 MG PO CAPS: 100.0000 mg | ORAL_CAPSULE | Freq: Two times a day (BID) | ORAL | 0 refills | Status: AC

## 2020-04-09 MED ORDER — BUDESONIDE 0.5 MG/2ML IN SUSP: RESPIRATORY_TRACT | 2 refills | Status: DC

## 2020-04-09 NOTE — Patient Instructions (Addendum)
Acute sinusitis  Begin doxycycline 100 mg tablets.  Take 1 tablet twice a day for 10 days.   Begin  prednisone 10 mg tablets. Take 2 tablets once a day for 4 days, then take 1 tablet on the 5th day, then stop.  If no improvement in your sinus symptoms, recommend evaluation from an ENT specialist  Allergic rhinitis Continue allergen avoidance measures directed toward grass pollen, tree pollen, dust mite, dog, cat, mold, and cockroach as listed below Continue allergen immunotherapy and have access to an epinephrine autoinjector Begin budesonide and saline nasal rinses twice a day.  This will replace Flonase for now Continue saline nasal rinses twice a day. Use the saline rinse before using the budesonide + saline rinse Stop Allegra for the next week.  You may restart Allegra 180 mg once a day as needed for a runny nose or itch in 1 week  Reflux Continue dietary and lifestyle modifications as listed below Continue omeprazole 20 mg once a day to control reflux  Your blood pressure was elevated in the clinic today. Follow up with your primary care provider for evaluation of your blood pressure  Call the clinic if this treatment plan is not working well for you  Follow up in 2 weeks or sooner if needed.Reducing Pollen Exposure The American Academy of Allergy, Asthma and Immunology suggests the following steps to reduce your exposure to pollen during allergy seasons. 1. Do not hang sheets or clothing out to dry; pollen may collect on these items. 2. Do not mow lawns or spend time around freshly cut grass; mowing stirs up pollen. 3. Keep windows closed at night.  Keep car windows closed while driving. 4. Minimize morning activities outdoors, a time when pollen counts are usually at their highest. 5. Stay indoors as much as possible when pollen counts or humidity is high and on windy days when pollen tends to remain in the air longer. 6. Use air conditioning when possible.  Many air conditioners  have filters that trap the pollen spores. 7. Use a HEPA room air filter to remove pollen form the indoor air you breathe. Control of Mold Allergen Mold and fungi can grow on a variety of surfaces provided certain temperature and moisture conditions exist.  Outdoor molds grow on plants, decaying vegetation and soil.  The major outdoor mold, Alternaria and Cladosporium, are found in very high numbers during hot and dry conditions.  Generally, a late Summer - Fall peak is seen for common outdoor fungal spores.  Rain will temporarily lower outdoor mold spore count, but counts rise rapidly when the rainy period ends.  The most important indoor molds are Aspergillus and Penicillium.  Dark, humid and poorly ventilated basements are ideal sites for mold growth.  The next most common sites of mold growth are the bathroom and the kitchen.  Outdoor [employer] 8. Use air conditioning and keep windows closed 9. Avoid exposure to decaying vegetation. 10. Avoid leaf raking. 11. Avoid grain handling. 12. Consider wearing a face mask if working in moldy areas.  Indoor Mold Control 1. Maintain humidity below 50%. 2. Clean washable surfaces with 5% bleach solution. 3. Remove sources e.g. Contaminated carpets. 4.  Control of Cockroach Allergen Cockroach allergen has been identified as an important cause of acute attacks of asthma, especially in urban settings.  There are fifty-five species of cockroach that exist in the [place], however only three, the [place], [place] species produce allergen that can affect patients with Asthma.  Allergens can be obtained from fecal particles, egg casings and secretions from cockroaches.    1. Remove food sources. 2. Reduce access to water. 3. Seal access and entry points. 4. Spray runways with 0.5-1% Diazinon or Chlorpyrifos 5. Blow boric acid power under stoves and refrigerator. 6. Place bait stations (hydramethylnon) at feeding sites.   Control of  Dog or Cat Allergen Avoidance is the best way to manage a dog or cat allergy. If you have a dog or cat and are allergic to dog or cats, consider removing the dog or cat from the home. If you have a dog or cat but don't want to find it a new home, or if your family wants a pet even though someone in the household is allergic, here are some strategies that may help keep symptoms at bay:  13. Keep the pet out of your bedroom and restrict it to only a few rooms. Be advised that keeping the dog or cat in only one room will not limit the allergens to that room. 51. Don't pet, hug or kiss the dog or cat; if you do, wash your hands with soap and water. 15. High-efficiency particulate air (HEPA) cleaners run continuously in a bedroom or living room can reduce allergen levels over time. 16. Regular use of a high-efficiency vacuum cleaner or a central vacuum can reduce allergen levels. 17. Giving your dog or cat a bath at least once a week can reduce airborne allergen.  Control of Dust Mite Allergen Dust mites play a major role in allergic asthma and rhinitis. They occur in environments with high humidity wherever human skin is found. Dust mites absorb humidity from the atmosphere (ie, they do not drink) and feed on organic matter (including shed human and animal skin). Dust mites are a microscopic type of insect that you cannot see with the naked eye. High levels of dust mites have been detected from mattresses, pillows, carpets, upholstered furniture, bed covers, clothes, soft toys and any woven material. The principal allergen of the dust mite is found in its feces. A gram of dust may contain 1,000 mites and 250,000 fecal particles. Mite antigen is easily measured in the air during house cleaning activities. Dust mites do not bite and do not cause harm to humans, other than by triggering allergies/asthma.  Ways to decrease your exposure to dust mites in your home:  1. Encase mattresses, box springs and  pillows with a mite-impermeable barrier or cover  2. Wash sheets, blankets and drapes weekly in hot water (130 F) with detergent and dry them in a dryer on the hot setting.  3. Have the room cleaned frequently with a vacuum cleaner and a damp dust-mop. For carpeting or rugs, vacuuming with a vacuum cleaner equipped with a high-efficiency particulate air (HEPA) filter. The dust mite allergic individual should not be in a room which is being cleaned and should wait 1 hour after cleaning before going into the room.  4. Do not sleep on upholstered furniture (eg, couches).  5. If possible removing carpeting, upholstered furniture and drapery from the home is ideal. Horizontal blinds should be eliminated in the rooms where the person spends the most time (bedroom, study, television room). Washable vinyl, roller-type shades are optimal.  6. Remove all non-washable stuffed toys from the bedroom. Wash stuffed toys weekly like sheets and blankets above.  7. Reduce indoor humidity to less than 50%. Inexpensive humidity monitors can be purchased at most hardware stores. Do not use a humidifier  as can make the problem worse and are not recommended.   Lifestyle Changes for Controlling GERD When you have GERD, stomach acid feels as if it's backing up toward your mouth. Whether or not you take medication to control your GERD, your symptoms can often be improved with lifestyle changes.   Raise Your Head  Reflux is more likely to strike when you're lying down flat, because stomach fluid can  flow backward more easily. Raising the head of your bed 4-6 inches can help. To do this:  Slide blocks or books under the legs at the head of your bed. Or, place a wedge under  the mattress. Many foam stores can make a suitable wedge for you. The wedge  should run from your waist to the top of your head.  Don't just prop your head on several pillows. This increases pressure on your  stomach. It can make GERD  worse.  Watch Your Eating Habits Certain foods may increase the acid in your stomach or relax the lower esophageal sphincter, making GERD more likely. It's best to avoid the following:  Coffee, tea, and carbonated drinks (with and without caffeine)  Fatty, fried, or spicy food  Mint, chocolate, onions, and tomatoes  Any other foods that seem to irritate your stomach or cause you pain  Relieve the Pressure  Eat smaller meals, even if you have to eat more often.  Don't lie down right after you eat. Wait a few hours for your stomach to empty.  Avoid tight belts and tight-fitting clothes.  Lose excess weight.  Tobacco and Alcohol  Avoid smoking tobacco and drinking alcohol. They can make GERD symptoms worse.

## 2020-04-09 NOTE — Telephone Encounter (Signed)
He can increase Cozaar to 100 mg ( 2 tabs) and follow up in 2 weeks

## 2020-04-09 NOTE — Progress Notes (Signed)
[place] [address] Dept: [phone]  FOLLOW UP NOTE  Patient ID: Marcus Gamble., male    DOB: 11/03/1945  Age: 75 y.o. MRN: [id number] Date of Office Visit: 04/09/2020  Assessment  Chief Complaint: Allergic Rhinitis  (Flare up. Post nasal drip, headache, cough, feels bad)  HPI Marcus Gomez. is a 75 year old male who presents to the clinic for evaluation of nasal congestion and sinus pressure.  He was last seen in this clinic on 02/12/2020 by Dr. Maudie Mercury for evaluation of cough, allergic rhinitis, and reflux.  In the interim, on 03/16/2020 he reported symptoms consistent with acute sinusitis and received Augmentin with a 5-day prednisone taper.  At today's visit, he reports that his original symptoms cleared for about 2 days with Augmentin and prednisone taper.  He reports that he is experiencing thick dark yellow nasal drainage, nasal congestion, frontal sinus pressure and headache, occasional sneeze, and occasional thick post nasal drainage and occasional throat clearing for 1 and half to 2 weeks.  He denies fever and sick contacts.  He continues Flonase with excellent application technique, nasal saline rinses twice a day, and montelukast 10 mg once a day.  He reports that he began Mucinex and stopped Allegra 1 week ago with no relief of symptoms.  He continues allergen immunotherapy once a week with no large local reactions.  He denies symptoms of heartburn including reflux or vomiting.  He does report that he may have been wheezing last night, however, he is not wheezing on a regular basis.  He continues omeprazole 20 mg once a day.  He denies history of asthma or previous inhaler use.  His current medications are listed in the chart.  Drug Allergies:  Allergies  Allergen Reactions  . Lisinopril Cough    Physical Exam: BP (!) 160/80   Pulse 65   Temp 97.6 F (36.4 C)   Resp 12   Ht 5\' 10"  (1.778 m)   Wt 245 lb 9.6 oz (111.4 kg)   SpO2  98%   BMI 35.24 kg/m    Physical Exam Vitals reviewed.  Constitutional:      Appearance: Normal appearance.  HENT:     Head: Normocephalic and atraumatic.     Right Ear: Tympanic membrane normal.     Left Ear: Tympanic membrane normal.     Nose:     Comments: Bilateral naris edematous and erythematous with thick clear nasal drainage noted.  Pharynx slightly erythematous with no exudate.  Ears normal.  Eyes normal. Eyes:     Conjunctiva/sclera: Conjunctivae normal.  Cardiovascular:     Rate and Rhythm: Normal rate and regular rhythm.     Heart sounds: Normal heart sounds. No murmur heard.   Pulmonary:     Effort: Pulmonary effort is normal.     Breath sounds: Normal breath sounds.     Comments: Lungs clear to auscultation Musculoskeletal:        General: Normal range of motion.     Cervical back: Normal range of motion and neck supple.  Skin:    General: Skin is warm and dry.  Neurological:     Mental Status: He is alert and oriented to person, place, and time.  Psychiatric:        Mood and Affect: Mood normal.        Behavior: Behavior normal.        Thought Content: Thought content normal.  Judgment: Judgment normal.      Assessment and Plan: 1. Acute sinusitis, recurrence not specified, unspecified location   2. Gastroesophageal reflux disease, unspecified whether esophagitis present   3. Allergic conjunctivitis of both eyes   4. Perennial and seasonal allergic rhinitis   5. Wheezing     Meds ordered this encounter  Medications  . budesonide (PULMICORT) 0.5 MG/2ML nebulizer solution    Sig: Begin budesonide rinses twice daily a day. Fill the NeilMed bottle with 240 cc saline using distilled water and salt mixture. Then add the entire 2cc vial of budesonide to the rinse bottle and mix together. Use 2 times a day    Dispense:  100 mL    Refill:  2  . doxycycline (VIBRAMYCIN) 100 MG capsule    Sig: Take 1 capsule (100 mg total) by mouth 2 (two) times daily  for 10 days.    Dispense:  20 capsule    Refill:  0    Patient Instructions  Acute sinusitis  Begin doxycycline 100 mg tablets.  Take 1 tablet twice a day for 10 days.   Begin  prednisone 10 mg tablets. Take 2 tablets once a day for 4 days, then take 1 tablet on the 5th day, then stop.  If no improvement in your sinus symptoms, recommend evaluation from an ENT specialist  Allergic rhinitis Continue allergen avoidance measures directed toward grass pollen, tree pollen, dust mite, dog, cat, mold, and cockroach as listed below Continue allergen immunotherapy and have access to an epinephrine autoinjector Begin budesonide and saline nasal rinses twice a day.  This will replace Flonase for now Continue saline nasal rinses twice a day. Use the saline rinse before using the budesonide + saline rinse Stop Allegra for the next week.  You may restart Allegra 180 mg once a day as needed for a runny nose or itch in 1 week  Reflux Continue dietary and lifestyle modifications as listed below Continue omeprazole 20 mg once a day to control reflux  Your blood pressure was elevated in the clinic today. Follow up with your primary care provider for evaluation of your blood pressure  Call the clinic if this treatment plan is not working well for you  Follow up in 2 weeks or sooner if needed.    Return in about 2 weeks (around 04/23/2020), or if symptoms worsen or fail to improve.    Thank you for the opportunity to care for this patient.  Please do not hesitate to contact me with questions.  Gareth Morgan, FNP [facility]

## 2020-04-09 NOTE — Telephone Encounter (Signed)
Pt is calling in stating that he went to the Allergy doctor and they took his blood pressuer about 5 times and it was 160/80.  Pt would like to have a call back to let him know what he should do.

## 2020-04-10 ENCOUNTER — Other Ambulatory Visit: Payer: Self-pay

## 2020-04-10 ENCOUNTER — Telehealth: Payer: Self-pay | Admitting: Family Medicine

## 2020-04-10 ENCOUNTER — Telehealth: Payer: Self-pay | Admitting: *Deleted

## 2020-04-10 MED ORDER — BUDESONIDE 0.5 MG/2ML IN SUSP: RESPIRATORY_TRACT | 2 refills | Status: DC

## 2020-04-10 NOTE — Telephone Encounter (Signed)
Patient talked to his eye doctor today and they told him that he should not take one of his medications that was prescribed yesterday (doxycycline or budesonide nasal rinses, he could not recall) due to a family history of increased occular pressure (patient's father). To be clear, the patient does not have a history of increased occular pressure. After much discussion, he is interested in continuing the course of doxycycline and not starting the budesonide nasal rinses at this time. He will return to saline nasal rinses and Flonase nasal spray as he was using previously.

## 2020-04-10 NOTE — Telephone Encounter (Signed)
Patient called back and spoke with Webb Silversmith.

## 2020-04-10 NOTE — Telephone Encounter (Signed)
LMOM for patient to call the clinic to clarify his earlier phone message regarding Doxycycline.

## 2020-04-10 NOTE — Telephone Encounter (Signed)
I called and left a message for this patient to call the clinic to clarify his earlier message.

## 2020-04-10 NOTE — Telephone Encounter (Signed)
I advised the patient of what [name] suggested and also scheduled the patient for a 2 weeks appointment.

## 2020-04-10 NOTE — Telephone Encounter (Signed)
Patient called and stated that he noticed that the Doxycycline has a warning of people with eye pressure issues should not take this medication. He called and spoke to his eye doctor this morning and his eye doctor stated that even though he does not have this issue Marcus Gomez's family does have a history of eye pressure. His eye doctor recommended to reach out to [place] to switch antibiotic but did not recommend an alternative. Please advise.

## 2020-04-15 ENCOUNTER — Ambulatory Visit (INDEPENDENT_AMBULATORY_CARE_PROVIDER_SITE_OTHER): Payer: PPO

## 2020-04-15 DIAGNOSIS — J309 Allergic rhinitis, unspecified: Secondary | ICD-10-CM

## 2020-04-23 ENCOUNTER — Ambulatory Visit (INDEPENDENT_AMBULATORY_CARE_PROVIDER_SITE_OTHER): Payer: PPO | Admitting: *Deleted

## 2020-04-23 ENCOUNTER — Encounter: Payer: Self-pay | Admitting: Adult Health

## 2020-04-23 ENCOUNTER — Ambulatory Visit (INDEPENDENT_AMBULATORY_CARE_PROVIDER_SITE_OTHER): Payer: PPO | Admitting: Adult Health

## 2020-04-23 ENCOUNTER — Other Ambulatory Visit: Payer: Self-pay

## 2020-04-23 DIAGNOSIS — I10 Essential (primary) hypertension: Secondary | ICD-10-CM | POA: Diagnosis not present

## 2020-04-23 DIAGNOSIS — J309 Allergic rhinitis, unspecified: Secondary | ICD-10-CM | POA: Diagnosis not present

## 2020-04-23 DIAGNOSIS — C61 Malignant neoplasm of prostate: Secondary | ICD-10-CM | POA: Diagnosis not present

## 2020-04-23 MED ORDER — LOSARTAN POTASSIUM 100 MG PO TABS: 100.0000 mg | ORAL_TABLET | Freq: Every day | ORAL | 1 refills | Status: DC

## 2020-04-23 NOTE — Progress Notes (Signed)
Subjective:    Patient ID: Marcus Gomez., male    DOB: 12-Feb-1945, 75 y.o.   MRN: [id number]  HPI  75 year old male who  has a past medical history of Essential hypertension and Prostate cancer ([place]).  He presents to the office today for follow up regarding Hypertension. We recently increased his Cozaar from 50 mg to 100 mg daily for better control of his BP, at the allergy clinic he was having elevated blood pressure readings upwards of 465 systolic. Since increasing his dose a few weeks ago he has not experienced dizziness, lightheadedness, blurred vision, or headaches   BP Readings from Last 3 Encounters:  04/23/20 120/60  04/09/20 (!) 160/80  02/13/20 132/60     Review of Systems See HPI   Past Medical History:  Diagnosis Date  . Essential hypertension   . Prostate cancer ([place])    Remission    Social History   Socioeconomic History  . Marital status: Divorced    Spouse name: Not on file  . Number of children: Not on file  . Years of education: Not on file  . Highest education level: Not on file  Occupational History  . Not on file  Tobacco Use  . Smoking status: Never Smoker  . Smokeless tobacco: Never Used  Vaping Use  . Vaping Use: Never used  Substance and Sexual Activity  . Alcohol use: Yes    Comment: socially   . Drug use: No  . Sexual activity: Yes  Other Topics Concern  . 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       Social Determinants of Health   Financial Resource Strain: Low Risk   . Difficulty of Paying Living Expenses: Not hard at all  Food Insecurity: No Food Insecurity  . Worried About Charity fundraiser in the Last Year: Never true  . Ran Out of Food in the Last Year: Never true  Transportation Needs: No Transportation Needs  . Lack of Transportation (Medical): No  . Lack of Transportation (Non-Medical): No  Physical Activity:  Sufficiently Active  . Days of Exercise per Week: 7 days  . Minutes of Exercise per Session: 30 min  Stress: No Stress Concern Present  . Feeling of Stress : Not at all  Social Connections: Moderately Integrated  . Frequency of Communication with Friends and Family: Never  . Frequency of Social Gatherings with Friends and Family: More than three times a week  . Attends Religious Services: 1 to 4 times per year  . Active Member of Clubs or Organizations: Yes  . Attends Archivist Meetings: More than 4 times per year  . Marital Status: Divorced  Human resources officer Violence: Not At Risk  . Fear of Current or Ex-Partner: No  . Emotionally Abused: No  . Physically Abused: No  . Sexually Abused: No    Past Surgical History:  Procedure Laterality Date  . KNEE SURGERY Bilateral 2015 & 2014  . SHOULDER SURGERY Right 2016    Family History  Problem Relation Age of Onset  . Stroke Mother   . Allergies Father   . COPD Father        never smoker  . Rheum arthritis Father   . Allergic rhinitis Neg Hx   . Angioedema Neg Hx   . Asthma Neg Hx   . Eczema  Neg Hx   . Immunodeficiency Neg Hx   . Urticaria Neg Hx     Allergies  Allergen Reactions  . Lisinopril Cough    Current Outpatient Medications on File Prior to Visit  Medication Sig Dispense Refill  . budesonide (PULMICORT) 0.5 MG/2ML nebulizer solution Begin budesonide rinses twice daily a day. Fill the NeilMed bottle with 240 cc saline using distilled water and salt mixture. Then add the entire 2cc vial of budesonide to the rinse bottle and mix together. Use 2 times a day 100 mL 2  . EPINEPHrine 0.3 mg/0.3 mL IJ SOAJ injection Inject 0.3 mLs (0.3 mg total) into the muscle once as needed for up to 1 dose for anaphylaxis. 0.3 mL 1  . fexofenadine (ALLEGRA) 180 MG tablet Take 180 mg by mouth daily as needed for allergies or rhinitis. (Patient not taking: Reported on 04/09/2020)    . fluticasone (FLONASE) 50 MCG/ACT nasal spray  Place 1 spray into both nostrils in the morning and at bedtime. For nasal congestion 16 g 5  . ipratropium (ATROVENT) 0.03 % nasal spray Place 2 sprays into both nostrils every 12 (twelve) hours. For drainage (Patient not taking: No sig reported) 30 mL 5  . Multiple Vitamins-Minerals (ONE-A-DAY MENS 50+ PO) Take by mouth.    . Multiple Vitamins-Minerals (PRESERVISION AREDS 2+MULTI VIT PO) Take 1 capsule by mouth in the morning and at bedtime.    Marland Kitchen omeprazole (PRILOSEC) 40 MG capsule Take 40mg  daily 30 minutes before breakfast. 30 capsule 5  . simvastatin (ZOCOR) 10 MG tablet Take 1 tablet (10 mg total) by mouth 2 (two) times a week. (Patient not taking: Reported on 04/09/2020) 24 tablet 0  . Turmeric (QC TUMERIC COMPLEX PO) Take 1,000 mg by mouth in the morning and at bedtime.    . [DISCONTINUED] losartan (COZAAR) 50 MG tablet Take 1 tablet (50 mg total) by mouth daily. (Patient taking differently: Take 100 mg by mouth daily.) 90 tablet 3   No current facility-administered medications on file prior to visit.    BP 120/60 (BP Location: Left Arm, Patient Position: Sitting, Cuff Size: Large)   Pulse 67   Temp 98.5 F (36.9 C) (Oral)   Ht 5\' 10"  (1.778 m)   Wt 241 lb 3.2 oz (109.4 kg)   SpO2 97%   BMI 34.61 kg/m       Objective:   Physical Exam Vitals and nursing note reviewed.  Cardiovascular:     Rate and Rhythm: Normal rate and regular rhythm.     Pulses: Normal pulses.     Heart sounds: Normal heart sounds.  Pulmonary:     Effort: Pulmonary effort is normal.     Breath sounds: Normal breath sounds.  Skin:    General: Skin is warm and dry.     Capillary Refill: Capillary refill takes less than 2 seconds.  Neurological:     General: No focal deficit present.     Mental Status: He is oriented to person, place, and time.  Psychiatric:        Mood and Affect: Mood normal.        Behavior: Behavior normal.       Assessment & Plan:  1. Essential hypertension - Currently well  controlled. Will continue with Cozaar 100 mg  - losartan (COZAAR) 100 MG tablet; Take 1 tablet (100 mg total) by mouth daily.  Dispense: 90 tablet; Refill: 1  [name], NP

## 2020-04-28 ENCOUNTER — Ambulatory Visit (INDEPENDENT_AMBULATORY_CARE_PROVIDER_SITE_OTHER): Payer: PPO | Admitting: *Deleted

## 2020-04-28 DIAGNOSIS — J309 Allergic rhinitis, unspecified: Secondary | ICD-10-CM

## 2020-05-05 DIAGNOSIS — J3089 Other allergic rhinitis: Secondary | ICD-10-CM | POA: Diagnosis not present

## 2020-05-05 NOTE — Progress Notes (Signed)
VIALS EXP 05-05-21 

## 2020-05-06 DIAGNOSIS — J3081 Allergic rhinitis due to animal (cat) (dog) hair and dander: Secondary | ICD-10-CM | POA: Diagnosis not present

## 2020-05-11 ENCOUNTER — Ambulatory Visit (INDEPENDENT_AMBULATORY_CARE_PROVIDER_SITE_OTHER): Payer: PPO

## 2020-05-11 ENCOUNTER — Telehealth: Payer: Self-pay

## 2020-05-11 DIAGNOSIS — J309 Allergic rhinitis, unspecified: Secondary | ICD-10-CM

## 2020-05-11 NOTE — Telephone Encounter (Signed)
That's fine. He can come every 2 weeks.

## 2020-05-11 NOTE — Telephone Encounter (Signed)
Patient came in to get his allergy injections and is wondering if he could go back to every 2 weeks. He stated that since Dr. 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 Please advise.

## 2020-05-11 NOTE — Telephone Encounter (Signed)
Flowsheet has been updated to reflect this change and patient has been made aware. 

## 2020-05-12 ENCOUNTER — Telehealth: Payer: Self-pay | Admitting: Pharmacist

## 2020-05-12 NOTE — Chronic Care Management (AMB) (Signed)
    Chronic Care Management Pharmacy Assistant   Name: Marcus Gomez.  MRN: [id number] DOB: Apr 14, 1945  Spoke with patient and reminded him of his telephone visit with [name] 05/13/20 at 4pm. Patient verbalized understanding and reminded patient to have all medications as well as any blood pressure or blood glucose reading close by.    Grant [id number]

## 2020-05-13 ENCOUNTER — Ambulatory Visit (INDEPENDENT_AMBULATORY_CARE_PROVIDER_SITE_OTHER): Payer: PPO | Admitting: Pharmacist

## 2020-05-13 DIAGNOSIS — I10 Essential (primary) hypertension: Secondary | ICD-10-CM

## 2020-05-13 DIAGNOSIS — E782 Mixed hyperlipidemia: Secondary | ICD-10-CM

## 2020-05-13 NOTE — Progress Notes (Signed)
Chronic Care Management Pharmacy Note  05/22/2020 Name:  Marcus Gomez [facility]. MRN:  [id number] DOB:  20-Oct-1945  Subjective: Marcus Gomez. is an 75 y.o. year old male who is a primary patient of [name], NP.  The CCM team was consulted for assistance with disease management and care coordination needs.    Engaged with patient by telephone for follow up visit in response to provider referral for pharmacy case management and/or care coordination services.   Consent to Services:  The patient was given information about Chronic Care Management services, agreed to services, and gave verbal consent prior to initiation of services.  Please see initial visit note for detailed documentation.   Patient Care Team: [name], NP as PCP - General (Family Medicine) [name], [facility] as Pharmacist (Pharmacist) Manson Passey, [facility] (Orthopedic Surgery)  Recent office visits: 04/23/20 [name], NP: Patient presented for HTN follow up. Continued losartan 100 mg daily.  02/13/31 Ofilia Neas, LPN: Patient presented for AWV.  01/14/20 CCM visit: Simvastatin started twice weekly to replace rosuvastatin.   07/24/19 [name], NP: Patient presented for follow up. Prescribed rosuvastatin 5 mg daily. Fasting glucose elevated. Recommend restarting losartan 50 mg daily and encouraged to monitor BP at home.  Recent consult visits: 04/15/20 Allergy: Patient presented for allergy injections.   04/09/20 Gareth Morgan, FNP (allergy): Patient presented for allergic rhinitis follow up. Prescribed Pulmicort and doxycycline and prednisone for sinusitis.  02/12/20 Marcene Duos, MD (ortho): Patient presented for shoulder pain.   02/12/20 Rexene Alberts, MD (allergy): Patient presented for coughing. Recommended decreasing ipratropium to as needed and then if no coughing, decrease omeprazole to 20 mg daily.  01/06/20 Daryll Brod (orthopedics): Patient presented for arthritis in hand follow up.    12/04/19 Daryll Brod (orthopedics): Patient presented for left hand pain. Refilled celecoxib 200 mg daily.  Hospital visits: None in previous 6 months  Objective:  Lab Results  Component Value Date   CREATININE 0.88 07/24/2019   BUN 14 07/24/2019   GFR 80.67 01/25/2018   GFRNONAA 73 (L) 12/09/2012   GFRAA 85 (L) 12/09/2012   NA 138 07/24/2019   K 4.7 07/24/2019   CALCIUM 9.3 07/24/2019   CO2 24 07/24/2019   GLUCOSE 111 (H) 07/24/2019    Lab Results  Component Value Date/Time   HGBA1C 5.7 03/16/2016 09:14 AM   GFR 80.67 01/25/2018 03:14 PM   GFR 95.73 03/16/2016 09:14 AM    Last diabetic Eye exam: No results found for: HMDIABEYEEXA  Last diabetic Foot exam: No results found for: HMDIABFOOTEX   Lab Results  Component Value Date   CHOL 187 07/24/2019   HDL 51 07/24/2019   LDLCALC 114 (H) 07/24/2019   TRIG 112 07/24/2019   CHOLHDL 3.7 07/24/2019    Hepatic Function Latest Ref Rng & Units 07/24/2019 01/25/2018 03/16/2016  Total Protein 6.1 - 8.1 g/dL 6.5 6.8 6.8  Albumin 3.5 - 5.2 g/dL - 4.2 4.2  AST 10 - 35 U/L _0 ALT 9 - 46 U/L _1 Alk Phosphatase 39 - 117 U/L - 65 76  Total Bilirubin 0.2 - 1.2 mg/dL 1.2 1.0 1.0  Bilirubin, Direct 0.0 - 0.3 mg/dL - 0.2 0.2    Lab Results  Component Value Date/Time   TSH 1.91 07/24/2019 08:23 AM   TSH 1.58 01/25/2018 03:14 PM    CBC Latest Ref Rng & Units 07/24/2019 01/25/2018 03/16/2016  WBC 3.8 - 10.8 Thousand/uL 6.5 7.6 7.0  Hemoglobin  13.2 - 17.1 g/dL 14.1 13.9 14.4  Hematocrit 38.5 - 50.0 % 43.1 41.0 42.9  Platelets 140 - 400 Thousand/uL 205 227.0 205.0    No results found for: VD25OH  Clinical ASCVD: No  The 10-year ASCVD risk score Mikey Bussing DC Jr., et al., 2013) is: 25%   Values used to calculate the score:     Age: 81 years     Sex: Male     Is Non-Hispanic African American: No     Diabetic: No     Tobacco smoker: No     Systolic Blood Pressure: 902 mmHg     Is BP treated: Yes     HDL Cholesterol: 51  mg/dL     Total Cholesterol: 187 mg/dL    Depression screen [facility] 2/9 02/13/2020 01/25/2018 10/24/2017  Decreased Interest 0 0 0  Down, Depressed, Hopeless 0 0 0  PHQ - 2 Score 0 0 0      Social History   Tobacco Use  Smoking Status Never Smoker  Smokeless Tobacco Never Used   BP Readings from Last 3 Encounters:  04/23/20 120/60  04/09/20 (!) 160/80  02/13/20 132/60   Pulse Readings from Last 3 Encounters:  04/23/20 67  04/09/20 65  02/13/20 64   Wt Readings from Last 3 Encounters:  04/23/20 241 lb 3.2 oz (109.4 kg)  04/09/20 245 lb 9.6 oz (111.4 kg)  02/13/20 243 lb 7 oz (110.4 kg)   BMI Readings from Last 3 Encounters:  04/23/20 34.61 kg/m  04/09/20 35.24 kg/m  02/13/20 34.93 kg/m    Assessment/Interventions: Review of patient past medical history, allergies, medications, health status, including review of consultants reports, laboratory and other test data, was performed as part of comprehensive evaluation and provision of chronic care management services.   SDOH:  (Social Determinants of Health) assessments and interventions performed: No  SDOH Screenings   Alcohol Screen: Low Risk   . Last Alcohol Screening Score (AUDIT): 0  Depression (PHQ2-9): Low Risk   . PHQ-2 Score: 0  Financial Resource Strain: Low Risk   . Difficulty of Paying Living Expenses: Not hard at all  Food Insecurity: No Food Insecurity  . Worried About Charity fundraiser in the Last Year: Never true  . Ran Out of Food in the Last Year: Never true  Housing: Low Risk   . Last Housing Risk Score: 0  Physical Activity: Sufficiently Active  . Days of Exercise per Week: 7 days  . Minutes of Exercise per Session: 30 min  Social Connections: Moderately Integrated  . Frequency of Communication with Friends and Family: Never  . Frequency of Social Gatherings with Friends and Family: More than three times a week  . Attends Religious Services: 1 to 4 times per year  . Active Member of Clubs or  Organizations: Yes  . Attends Archivist Meetings: More than 4 times per year  . Marital Status: Divorced  Stress: No Stress Concern Present  . Feeling of Stress : Not at all  Tobacco Use: Low Risk   . Smoking Tobacco Use: Never Smoker  . Smokeless Tobacco Use: Never Used  Transportation Needs: No Transportation Needs  . Lack of Transportation (Medical): No  . Lack of Transportation (Non-Medical): No    CCM Care Plan  Allergies  Allergen Reactions  . Lisinopril Cough    Medications Reviewed Today    Reviewed by Agnes Lawrence, CMA (Certified Medical Assistant) on 04/23/20 at [phone]  Med List Status: <None>  Medication Order Taking? Sig Documenting Provider Last Dose Status Informant  budesonide (PULMICORT) 0.5 MG/2ML nebulizer solution [id number]  Begin budesonide rinses twice daily a day. Fill the NeilMed bottle with 240 cc saline using distilled water and salt mixture. Then add the entire 2cc vial of budesonide to the rinse bottle and mix together. Use 2 times a day Dara Hoyer, FNP  Active   EPINEPHrine 0.3 mg/0.3 mL IJ SOAJ injection [id number] No Inject 0.3 mLs (0.3 mg total) into the muscle once as needed for up to 1 dose for anaphylaxis. Bobbitt, Sedalia Muta, MD Taking Active   fexofenadine [facility]) 180 MG tablet [id number] No Take 180 mg by mouth daily as needed for allergies or rhinitis.  Patient not taking: Reported on 04/09/2020   [provider] Not Taking Active   fluticasone (FLONASE) 50 MCG/ACT nasal spray [id number] No Place 1 spray into both nostrils in the morning and at bedtime. For nasal congestion Garnet Sierras, DO Taking Active   ipratropium (ATROVENT) 0.03 % nasal spray [id number] No Place 2 sprays into both nostrils every 12 (twelve) hours. For drainage  Patient not taking: No sig reported   Garnet Sierras, DO Not Taking Active   losartan (COZAAR) 50 MG tablet [id number] No Take 1 tablet (50 mg total) by mouth daily.  Patient taking differently: Take  100 mg by mouth daily.   Nafziger, [name], NP Taking Active   Multiple Vitamins-Minerals (ONE-A-DAY MENS 50+ PO) [id number] No Take by mouth. [provider] Taking Active   Multiple Vitamins-Minerals (PRESERVISION AREDS 2+MULTI VIT PO) [id number] No Take 1 capsule by mouth in the morning and at bedtime. [provider] Taking Active   omeprazole (PRILOSEC) 40 MG capsule [id number] No Take 11m daily 30 minutes before breakfast. KGarnet Sierras DO Taking Active   simvastatin (ZOCOR) 10 MG tablet [id number] Take 1 tablet (10 mg total) by mouth 2 (two) times a week.  Patient not taking: Reported on 04/09/2020   NDorothyann Peng NP Not Taking Active   Turmeric (QC TUMERIC COMPLEX PO) [id number] Take 1,000 mg by mouth in the morning and at bedtime. [provider] Taking Active           Patient Active Problem List   Diagnosis Date Noted  . GERD (gastroesophageal reflux disease) 07/24/2017  . Perennial and seasonal allergic rhinitis 03/13/2017  . Coughing 03/13/2017  . Lung field abnormal finding on examination 08/31/2015  . Essential hypertension 08/31/2015    Immunization History  Administered Date(s) Administered  . Fluad Quad(high Dose 65+) 02/13/2020  . Influenza, High Dose Seasonal PF 11/03/2017  . Moderna SARS-COV2 Booster Vaccination 12/21/2019  . Moderna Sars-Covid-2 Vaccination 02/22/2019, 03/22/2019  . Pneumococcal Conjugate-13 03/16/2016  . Pneumococcal Polysaccharide-23 01/25/2018    Conditions to be addressed/monitored:  Hypertension, Hyperlipidemia, GERD, Osteoarthritis and Allergic Rhinitis  Care Plan : CCM Pharmacy Care Plan  Updates made by PViona Gilmore ROld Monroesince 05/22/2020 12:00 AM    Problem: Problem: Hypertension, Hyperlipidemia, GERD, Osteoarthritis and Allergic Rhinitis     Long-Range Goal: Patient-Specific Goal   Start Date: 05/13/2020  Expected End Date: 05/13/2021  This Visit's Progress: On track  Priority: High  Note:    Current Barriers:  . Unable to independently monitor therapeutic efficacy . Unable to achieve control of cholesterol   Pharmacist Clinical Goal(s):  .Marland KitchenPatient will achieve control of cholesterol as evidenced by next lipid panel  through collaboration with PharmD and provider.   Interventions: . 1:1 collaboration  with [name], NP regarding development and update of comprehensive plan of care as evidenced by provider attestation and co-signature . Inter-disciplinary care team collaboration (see longitudinal plan of care) . Comprehensive medication review performed; medication list updated in electronic medical record  Hypertension (BP goal <140/90) -Not ideally controlled -Current treatment: . Losartan 100 mg 1 tablet daily -Medications previously tried: lisinopril (cough) -Current home readings: 140/70 (needs to get a BP cuff) -Current dietary habits: doesn't add salt to food; using a lower sodium option; eating out frequently -Current exercise habits: he tries to walk a couple of miles at least 3 times a week but his knees and shoulder bothers him some -Denies hypotensive/hypertensive symptoms -Educated on Exercise goal of 150 minutes per week; Importance of home blood pressure monitoring; Proper BP monitoring technique; -Counseled to monitor BP at home weekly, document, and provide log at future appointments -Counseled on diet and exercise extensively Recommended to continue current medication  Hyperlipidemia: (LDL goal < 100) -Uncontrolled -Current treatment: . Rosuvastatin 5 mg every other day -Medications previously tried: simvastatin (never started)  -Current dietary patterns: eating out frequently; cut back on beer -Current exercise habits: he tries to walk a couple of miles at least 3 times a week but his knees and shoulder bothers him some -Educated on Cholesterol goals;  Importance of limiting foods high in cholesterol; Exercise goal of 150 minutes per  week; -Counseled on diet and exercise extensively Recommended to continue current medication  GERD (Goal: minimize symptoms) -Not ideally controlled -Current treatment  . Omeprazole 20 mg 1 capsule daily before breakfast -Medications previously tried: none  - Patient has not noted a change in cough since starting  Allergic rhinitis (Goal: minimize symptoms) -Controlled -Current treatment   Flonase 50 mcg/act nasal spray 1 spray in the morning and at bedtime   Budesonide 0.5 mg/52m rinse twice daily  Ipratropium 0.03% nasal spray 1 spray in each nostril in the morning and bedtime  Montelukast 10 mg 1 tablet daily at bedtime  Allegra 160 mg 1 tablet as needed -Medications previously tried: none  -Recommended to continue current medication  Osteoarthritis (Goal: minimize pain) -Controlled -Current treatment   Celecoxib 200 mg 1 capsule every other day with food  Turmeric 1000 mg 1 tablet daily -Medications previously tried: n/a  -Patient played football for several years and thinks this is from this; discussed preference of Tylenol over NSAIDs and recommended trial of diclofenac gel   Health Maintenance -Vaccine gaps: shingles, tetanus -Current therapy:   Pataday 0.2% 1 drop in both eyes  Multivitamin 1 tablet daily  Preservision Areds 2 1 tablet twice daily  SootheXP eye drops 1-2 times a day  Eye gel daily -Educated on Cost vs benefit of each product must be carefully weighed by individual consumer -Patient is satisfied with current therapy and denies issues -Recommended to continue current medication  Patient Goals/Self-Care Activities . Patient will:  - take medications as prescribed check blood pressure weekly, document, and provide at future appointments target a minimum of 150 minutes of moderate intensity exercise weekly  Follow Up Plan: Telephone follow up appointment with care management team member scheduled for: 5 months      Medication  Assistance: None required.  Patient affirms current coverage meets needs.  [facility]  [facility] [place]Phone: [phone]: [phone] [place] [address] [place]) [place] [address] Phone: [phone] Fax: [phone]  Uses pill box? No - sitting in the cabinet - all in different cabinets for different times of day Pt endorses 100% compliance  We discussed: Current pharmacy is preferred with insurance plan and patient is satisfied with pharmacy services Patient decided to: Continue current medication management strategy  Care Plan and Follow Up Patient Decision:  Patient agrees to Care Plan and Follow-up.  Plan: Telephone follow up appointment with care management team member scheduled for:  5 months  [name], PharmD [place] Pharmacist [place] [facility] [place] [phone]

## 2020-05-22 ENCOUNTER — Other Ambulatory Visit: Payer: Self-pay | Admitting: Adult Health

## 2020-05-22 MED ORDER — ROSUVASTATIN CALCIUM 5 MG PO TABS: 5.0000 mg | ORAL_TABLET | ORAL | 3 refills | Status: DC

## 2020-05-22 NOTE — Patient Instructions (Addendum)
Hi Marcus Gomez,  It was great to speak with you again! Below is a summary of some of the topics we discussed.   [name] will also send in a new updated prescription for the rosuvastatin again!  Please reach out to me if you have any questions or need anything before our follow up!  Best, Maddie  [name], PharmD, [facility]  Visit Information  Goals Addressed   None    Patient Care Plan: CCM Pharmacy Care Plan    Problem Identified: Problem: Hypertension, Hyperlipidemia, GERD, Osteoarthritis and Allergic Rhinitis     Long-Range Goal: Patient-Specific Goal   Start Date: 05/13/2020  Expected End Date: 05/13/2021  This Visit's Progress: On track  Priority: High  Note:   Current Barriers:  . Unable to independently monitor therapeutic efficacy . Unable to achieve control of cholesterol   Pharmacist Clinical Goal(s):  Marland Kitchen Patient will achieve control of cholesterol as evidenced by next lipid panel  through collaboration with PharmD and provider.   Interventions: . 1:1 collaboration with [name], NP regarding development and update of comprehensive plan of care as evidenced by provider attestation and co-signature . Inter-disciplinary care team collaboration (see longitudinal plan of care) . Comprehensive medication review performed; medication list updated in electronic medical record  Hypertension (BP goal <140/90) -Not ideally controlled -Current treatment: . Losartan 100 mg 1 tablet daily -Medications previously tried: lisinopril (cough) -Current home readings: 140/70 (needs to get a BP cuff) -Current dietary habits: doesn't add salt to food; using a lower sodium option; eating out frequently -Current exercise habits: he tries to walk a couple of miles at least 3 times a week but his knees and shoulder bothers him some -Denies hypotensive/hypertensive symptoms -Educated on Exercise goal of 150 minutes per  week; Importance of home blood pressure monitoring; Proper BP monitoring technique; -Counseled to monitor BP at home weekly, document, and provide log at future appointments -Counseled on diet and exercise extensively Recommended to continue current medication  Hyperlipidemia: (LDL goal < 100) -Uncontrolled -Current treatment: . Rosuvastatin 5 mg every other day -Medications previously tried: simvastatin (never started)  -Current dietary patterns: eating out frequently; cut back on beer -Current exercise habits: he tries to walk a couple of miles at least 3 times a week but his knees and shoulder bothers him some -Educated on Cholesterol goals;  Importance of limiting foods high in cholesterol; Exercise goal of 150 minutes per week; -Counseled on diet and exercise extensively Recommended to continue current medication  GERD (Goal: minimize symptoms) -Not ideally controlled -Current treatment  . Omeprazole 20 mg 1 capsule daily before breakfast -Medications previously tried: none  - Patient has not noted a change in cough since starting  Allergic rhinitis (Goal: minimize symptoms) -Controlled -Current treatment   Flonase 50 mcg/act nasal spray 1 spray in the morning and at bedtime   Budesonide 0.5 mg/50ml rinse twice daily  Ipratropium 0.03% nasal spray 1 spray in each nostril in the morning and bedtime  Montelukast 10 mg 1 tablet daily at bedtime  Allegra 160 mg 1 tablet as needed -Medications previously tried: none  -Recommended to continue current medication  Osteoarthritis (Goal: minimize pain) -Controlled -Current treatment   Celecoxib 200 mg 1 capsule every other day with food  Turmeric 1000 mg 1 tablet daily -Medications previously tried: n/a  -Patient played football for several years and thinks this is from this; discussed preference of Tylenol over NSAIDs and recommended trial of diclofenac gel  Health Maintenance -Vaccine gaps: shingles,  tetanus -Current therapy:   Pataday 0.2% 1 drop in both eyes  Multivitamin 1 tablet daily  Preservision Areds 2 1 tablet twice daily  SootheXP eye drops 1-2 times a day  Eye gel daily -Educated on Cost vs benefit of each product must be carefully weighed by individual consumer -Patient is satisfied with current therapy and denies issues -Recommended to continue current medication  Patient Goals/Self-Care Activities . Patient will:  - take medications as prescribed check blood pressure weekly, document, and provide at future appointments target a minimum of 150 minutes of moderate intensity exercise weekly  Follow Up Plan: Telephone follow up appointment with care management team member scheduled for: 5 months       Patient verbalizes understanding of instructions provided today and agrees to view in [place].  Telephone follow up appointment with pharmacy team member scheduled for: 5 months  Marcus Gomez, [facility]  High Cholesterol  High cholesterol is a condition in which the blood has high levels of a white, waxy substance similar to fat (cholesterol). The liver makes all the cholesterol that the body needs. The human body needs small amounts of cholesterol to help build cells. A person gets extra or excess cholesterol from the food that he or she eats. The blood carries cholesterol from the liver to the rest of the body. If you have high cholesterol, deposits (plaques) may build up on the walls of your arteries. Arteries are the blood vessels that carry blood away from your heart. These plaques make the arteries narrow and stiff. Cholesterol plaques increase your risk for heart attack and stroke. Work with your health care provider to keep your cholesterol levels in a healthy range. What increases the risk? The following factors may make you more likely to develop this condition:  Eating foods that are high in animal fat (saturated fat) or cholesterol.  Being  overweight.  Not getting enough exercise.  A family history of high cholesterol (familial hypercholesterolemia).  Use of tobacco products.  Having diabetes. What are the signs or symptoms? There are no symptoms of this condition. How is this diagnosed? This condition may be diagnosed based on the results of a blood test.  If you are older than 75 years of age, your health care provider may check your cholesterol levels every 4-6 years.  You may be checked more often if you have high cholesterol or other risk factors for heart disease. The blood test for cholesterol measures:  "Bad" cholesterol, or LDL cholesterol. This is the main type of cholesterol that causes heart disease. The desired level is less than 100 mg/dL.  "Good" cholesterol, or HDL cholesterol. HDL helps protect against heart disease by cleaning the arteries and carrying the LDL to the liver for processing. The desired level for HDL is 60 mg/dL or higher.  Triglycerides. These are fats that your body can store or burn for energy. The desired level is less than 150 mg/dL.  Total cholesterol. This measures the total amount of cholesterol in your blood and includes LDL, HDL, and triglycerides. The desired level is less than 200 mg/dL. How is this treated? This condition may be treated with:  Diet changes. You may be asked to eat foods that have more fiber and less saturated fats or added sugar.  Lifestyle changes. These may include regular exercise, maintaining a healthy weight, and quitting use of tobacco products.  Medicines. These are given when diet and lifestyle changes have not worked. You  may be prescribed a statin medicine to help lower your cholesterol levels. Follow these instructions at home: Eating and drinking  Eat a healthy, balanced diet. This diet includes: ? Daily servings of a variety of fresh, frozen, or canned fruits and vegetables. ? Daily servings of whole grain foods that are rich in  fiber. ? Foods that are low in saturated fats and trans fats. These include poultry and fish without skin, lean cuts of meat, and low-fat dairy products. ? A variety of fish, especially oily fish that contain omega-3 fatty acids. Aim to eat fish at least 2 times a week.  Avoid foods and drinks that have added sugar.  Use healthy cooking methods, such as roasting, grilling, broiling, baking, poaching, steaming, and stir-frying. Do not fry your food except for stir-frying.   Lifestyle  Get regular exercise. Aim to exercise for a total of 150 minutes a week. Increase your activity level by doing activities such as gardening, walking, and taking the stairs.  Do not use any products that contain nicotine or tobacco, such as cigarettes, e-cigarettes, and chewing tobacco. If you need help quitting, ask your health care provider.   General instructions  Take over-the-counter and prescription medicines only as told by your health care provider.  Keep all follow-up visits as told by your health care provider. This is important. Where to find more information  American Heart Association: www.heart.org  National Heart, Lung, and Blood Institute: https://wilson-eaton.com/ Contact a health care provider if:  You have trouble achieving or maintaining a healthy diet or weight.  You are starting an exercise program.  You are unable to stop smoking. Get help right away if:  You have chest pain.  You have trouble breathing.  You have any symptoms of a stroke. "BE FAST" is an easy way to remember the main warning signs of a stroke: ? B - Balance. Signs are dizziness, sudden trouble walking, or loss of balance. ? E - Eyes. Signs are trouble seeing or a sudden change in vision. ? F - Face. Signs are sudden weakness or numbness of the face, or the face or eyelid drooping on one side. ? A - Arms. Signs are weakness or numbness in an arm. This happens suddenly and usually on one side of the body. ? S -  Speech. Signs are sudden trouble speaking, slurred speech, or trouble understanding what people say. ? T - Time. Time to call emergency services. Write down what time symptoms started.  You have other signs of a stroke, such as: ? A sudden, severe headache with no known cause. ? Nausea or vomiting. ? Seizure. These symptoms may represent a serious problem that is an emergency. Do not wait to see if the symptoms will go away. Get medical help right away. Call your local emergency services (911 in the [place] Do not drive yourself to the hospital. Summary  Cholesterol plaques increase your risk for heart attack and stroke. Work with your health care provider to keep your cholesterol levels in a healthy range.  Eat a healthy, balanced diet, get regular exercise, and maintain a healthy weight.  Do not use any products that contain nicotine or tobacco, such as cigarettes, e-cigarettes, and chewing tobacco.  Get help right away if you have any symptoms of a stroke. This information is not intended to replace advice given to you by your health care provider. Make sure you discuss any questions you have with your health care provider. Document Revised: 11/26/2018 Document Reviewed: 11/26/2018  Elsevier Patient Education  2021 [employer].

## 2020-05-25 ENCOUNTER — Ambulatory Visit (INDEPENDENT_AMBULATORY_CARE_PROVIDER_SITE_OTHER): Payer: PPO | Admitting: *Deleted

## 2020-05-25 DIAGNOSIS — J309 Allergic rhinitis, unspecified: Secondary | ICD-10-CM | POA: Diagnosis not present

## 2020-06-02 ENCOUNTER — Other Ambulatory Visit: Payer: Self-pay | Admitting: Allergy

## 2020-06-02 NOTE — Telephone Encounter (Signed)
Received a refill request for Montelukast, but based on prior notes and medication history dr.kim has never filled this prescription. Please advise.

## 2020-06-02 NOTE — Telephone Encounter (Signed)
Sent in rx.

## 2020-06-09 ENCOUNTER — Ambulatory Visit (INDEPENDENT_AMBULATORY_CARE_PROVIDER_SITE_OTHER): Payer: PPO | Admitting: *Deleted

## 2020-06-09 DIAGNOSIS — J309 Allergic rhinitis, unspecified: Secondary | ICD-10-CM

## 2020-06-15 ENCOUNTER — Encounter: Payer: Self-pay | Admitting: Allergy

## 2020-06-15 ENCOUNTER — Ambulatory Visit (INDEPENDENT_AMBULATORY_CARE_PROVIDER_SITE_OTHER): Payer: PPO | Admitting: Allergy

## 2020-06-15 ENCOUNTER — Other Ambulatory Visit: Payer: Self-pay

## 2020-06-15 VITALS — BP 138/64 | HR 66 | Temp 98.2°F | Resp 16 | Ht 70.0 in | Wt 244.4 lb

## 2020-06-15 DIAGNOSIS — J3089 Other allergic rhinitis: Secondary | ICD-10-CM | POA: Diagnosis not present

## 2020-06-15 DIAGNOSIS — K219 Gastro-esophageal reflux disease without esophagitis: Secondary | ICD-10-CM | POA: Diagnosis not present

## 2020-06-15 MED ORDER — OMEPRAZOLE 20 MG PO CPDR: 20.0000 mg | DELAYED_RELEASE_CAPSULE | Freq: Every day | ORAL | 5 refills | Status: DC | PRN

## 2020-06-15 NOTE — Progress Notes (Signed)
Follow Up Note  RE: Marcus Gomez. MRN: [id number] DOB: 1945/12/24 Date of Office Visit: 06/15/2020  Referring provider: [name], NP Primary care provider: [name], NP  Chief Complaint: Cough  History of Present Illness: I had the pleasure of seeing Marcus Gomez for a follow up visit at the [facility] on 06/15/2020. He is a 75 y.o. male, who is being followed for allergic rhinitis on AIT, reflux. His previous allergy office visit was on 04/09/2020 with Gareth Morgan, [place]. Today is a regular follow up visit.  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 Currently on AIT with no issues.  No sneezing, coughing, nasal congestion.  Currently taking Singulair at night and saline rinses with good benefit. Sometimes forgets to take Singulair with no worsening symptoms.   Reflux Currently omeprazole 20 mg once a day to control reflux but sometimes forgets with no flare in symptoms.  He also quit drinking beer.   Assessment and Plan: [name] is a 75 y.o. male with: Perennial and seasonal allergic rhinitis Past history - 2019 skin testing was positive to grass, trees, mold, cat, dog, cockroach and dust mites. Started AIT on 11/12/2018 (G-T-DM-D & M-C-CR). 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   Continue environmental control measures.  Continue allergy injections.   Continue Singulair 10mg  daily.  Try to take every other day.   Use over the counter antihistamines such as Zyrtec (cetirizine), Claritin (loratadine), Allegra (fexofenadine), or Xyzal (levocetirizine) daily as needed. May switch antihistamines every few months.  Nasal saline spray (i.e., Simply Saline) or nasal saline lavage (i.e., NeilMed) is recommended as needed and prior to medicated nasal sprays.  GERD (gastroesophageal reflux disease) No issues if missing a dose. Stopped drinking beer.    Continue appropriate reflux lifestyle modifications.  Only use omeprazole to 20mg  daily in the mornings if needed.  No food/drink for 30 minutes afterwards.   Return in about 6 months (around 12/15/2020).  Meds ordered this encounter  Medications  . omeprazole (PRILOSEC) 20 MG capsule    Sig: Take 1 capsule (20 mg total) by mouth daily as needed (reflux).    Dispense:  30 capsule    Refill:  5   Lab Orders  No laboratory test(s) ordered today    Diagnostics: None.  Medication List:  Current Outpatient Medications  Medication Sig Dispense Refill  . celecoxib (CELEBREX) 200 MG capsule Take 200 mg by mouth every other day.    Marland Kitchen EPINEPHrine 0.3 mg/0.3 mL IJ SOAJ injection Inject 0.3 mLs (0.3 mg total) into the muscle once as needed for up to 1 dose for anaphylaxis. 0.3 mL 1  . losartan (COZAAR) 100 MG tablet Take 1 tablet (100 mg total) by mouth daily. 90 tablet 1  . montelukast (SINGULAIR) 10 MG tablet TAKE 1 TABLET BY MOUTH AT BEDTIME 90 tablet 2  . Multiple Vitamins-Minerals (ONE-A-DAY MENS 50+ PO) Take by mouth.    . Multiple Vitamins-Minerals (PRESERVISION AREDS 2+MULTI VIT PO) Take 1 capsule by mouth in the morning and at bedtime.    Marland Kitchen omeprazole (PRILOSEC) 20 MG capsule Take 1 capsule (20 mg total) by mouth daily as needed (reflux). 30 capsule 5  . rosuvastatin (CRESTOR) 5 MG tablet Take 1 tablet (5 mg total) by mouth every other day. 45 tablet 3  . Turmeric (QC TUMERIC COMPLEX PO) Take 1,000 mg by mouth in the morning and at bedtime.  No current facility-administered medications for this visit.   Allergies: Allergies  Allergen Reactions  . Lisinopril Cough   I reviewed his past medical history, social history, family history, and environmental history and no significant changes have been reported from his previous visit.  Review of Systems  Constitutional: Negative for appetite change, chills, fever and unexpected weight change.  HENT: Negative for congestion,  postnasal drip and rhinorrhea.   Eyes: Negative for itching.  Respiratory: Negative for cough, chest tightness, shortness of breath and wheezing.   Gastrointestinal: Negative for abdominal pain.  Skin: Negative for rash.  Allergic/Immunologic: Positive for environmental allergies.  Neurological: Negative for headaches.   Objective: BP 138/64 (BP Location: Left Arm, Patient Position: Sitting, Cuff Size: Normal)   Pulse 66   Temp 98.2 F (36.8 C) (Temporal)   Resp 16   Ht 5\' 10"  (1.778 m)   Wt 244 lb 6.4 oz (110.9 kg)   SpO2 98%   BMI 35.07 kg/m  Body mass index is 35.07 kg/m. Physical Exam Vitals and nursing note reviewed.  Constitutional:      Appearance: Normal appearance. He is well-developed.  HENT:     Head: Normocephalic and atraumatic.     Right Ear: Tympanic membrane and external ear normal.     Left Ear: Tympanic membrane and external ear normal.     Nose: Nose normal.     Mouth/Throat:     Mouth: Mucous membranes are moist.     Pharynx: Oropharynx is clear.  Eyes:     Conjunctiva/sclera: Conjunctivae normal.  Cardiovascular:     Rate and Rhythm: Normal rate and regular rhythm.     Heart sounds: Normal heart sounds. No murmur heard.   Pulmonary:     Effort: Pulmonary effort is normal.     Breath sounds: Normal breath sounds. No wheezing, rhonchi or rales.  Musculoskeletal:     Cervical back: Neck supple.  Skin:    General: Skin is warm.     Findings: No rash.  Neurological:     Mental Status: He is alert and oriented to person, place, and time.  Psychiatric:        Behavior: Behavior normal.    Previous notes and tests were reviewed. The plan was reviewed with the patient/family, and all questions/concerned were addressed.  It was my pleasure to see Marcus Gomez today and participate in his care. Please feel free to contact me with any questions or concerns.  Sincerely,  Rexene Alberts, DO Allergy & Immunology  [facility] office: [place] office: [phone]

## 2020-06-15 NOTE — Assessment & Plan Note (Signed)
No issues if missing a dose. Stopped drinking beer.   Continue appropriate reflux lifestyle modifications.  Only use omeprazole to 20mg  daily in the mornings if needed.  No food/drink for 30 minutes afterwards.

## 2020-06-15 NOTE — Assessment & Plan Note (Signed)
Past history - 2019 skin testing was positive to grass, trees, mold, cat, dog, cockroach and dust mites. Started AIT on 11/12/2018 (G-T-DM-D & M-C-CR). 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   Continue environmental control measures.  Continue allergy injections.   Continue Singulair 10mg  daily.  Try to take every other day.   Use over the counter antihistamines such as Zyrtec (cetirizine), Claritin (loratadine), Allegra (fexofenadine), or Xyzal (levocetirizine) daily as needed. May switch antihistamines every few months.  Nasal saline spray (i.e., Simply Saline) or nasal saline lavage (i.e., NeilMed) is recommended as needed and prior to medicated nasal sprays.

## 2020-06-15 NOTE — Patient Instructions (Addendum)
Perennial and seasonal allergic rhinitis  2019 skin testing was positive to grass, trees, mold, cat, dog, cockroach and dust mites.  Continue environmental control measures.  Continue allergy injections. .   Continue Singulair 10mg  daily.  Try to take every other day.   Use over the counter antihistamines such as Zyrtec (cetirizine), Claritin (loratadine), Allegra (fexofenadine), or Xyzal (levocetirizine) daily as needed. May switch antihistamines every few months.  Nasal saline spray (i.e., Simply Saline) or nasal saline lavage (i.e., NeilMed) is recommended as needed and prior to medicated nasal sprays.  GERD (gastroesophageal reflux disease)  Continue appropriate reflux lifestyle modifications.  Only use omeprazole to 20mg  daily in the mornings if needed.  No food/drink for 30 minutes afterwards.   Follow up in 6 months or sooner if needed.  Reducing Pollen Exposure . Pollen seasons: trees (spring), grass (summer) and ragweed/weeds (fall). Marland Kitchen Keep windows closed in your home and car to lower pollen exposure.  Susa Simmonds air conditioning in the bedroom and throughout the house if possible.  . Avoid going out in dry windy days - especially early morning. . Pollen counts are highest between 5 - 10 AM and on dry, hot and windy days.  . Save outside activities for late afternoon or after a heavy rain, when pollen levels are lower.  . Avoid mowing of grass if you have grass pollen allergy. Marland Kitchen Be aware that pollen can also be transported indoors on people and pets.  . Dry your clothes in an automatic dryer rather than hanging them outside where they might collect pollen.  . Rinse hair and eyes before bedtime. Mold Control . Mold and fungi can grow on a variety of surfaces provided certain temperature and moisture conditions exist.  . Outdoor molds grow on plants, decaying vegetation and soil. The major outdoor mold, Alternaria and Cladosporium, are found in very high numbers during  hot and dry conditions. Generally, a late summer - fall peak is seen for common outdoor fungal spores. Rain will temporarily lower outdoor mold spore count, but counts rise rapidly when the rainy period ends. . The most important indoor molds are Aspergillus and Penicillium. Dark, humid and poorly ventilated basements are ideal sites for mold growth. The next most common sites of mold growth are the bathroom and the kitchen. Outdoor (Seasonal) Mold Control . Use air conditioning and keep windows closed. . Avoid exposure to decaying vegetation. Marland Kitchen Avoid leaf raking. . Avoid grain handling. . Consider wearing a face mask if working in moldy areas.  Indoor (Perennial) Mold Control  . Maintain humidity below 50%. . Get rid of mold growth on hard surfaces with water, detergent and, if necessary, 5% bleach (do not mix with other cleaners). Then dry the area completely. If mold covers an area more than 10 square feet, consider hiring an indoor environmental professional. . For clothing, washing with soap and water is best. If moldy items cannot be cleaned and dried, throw them away. . Remove sources e.g. contaminated carpets. . Repair and seal leaking roofs or pipes. Using dehumidifiers in damp basements may be helpful, but empty the water and clean units regularly to prevent mildew from forming. All rooms, especially basements, bathrooms and kitchens, require ventilation and cleaning to deter mold and mildew growth. Avoid carpeting on concrete or damp floors, and storing items in damp areas. Pet Allergen Avoidance: . Contrary to popular opinion, there are no "hypoallergenic" breeds of dogs or cats. That is because people are not allergic to an animal's hair,  but to an allergen found in the animal's saliva, dander (dead skin flakes) or urine. Pet allergy symptoms typically occur within minutes. For some people, symptoms can build up and become most severe 8 to 12 hours after contact with the animal. People  with severe allergies can experience reactions in public places if dander has been transported on the pet owners' clothing. Marland Kitchen Keeping an animal outdoors is only a partial solution, since homes with pets in the yard still have higher concentrations of animal allergens. . Before getting a pet, ask your allergist to determine if you are allergic to animals. If your pet is already considered part of your family, try to minimize contact and keep the pet out of the bedroom and other rooms where you spend a great deal of time. . As with dust mites, vacuum carpets often or replace carpet with a hardwood floor, tile or linoleum. . High-efficiency particulate air (HEPA) cleaners can reduce allergen levels over time. . While dander and saliva are the source of cat and dog allergens, urine is the source of allergens from rabbits, hamsters, mice and [place] pigs; so ask a non-allergic family member to clean the animal's cage. . If you have a pet allergy, talk to your allergist about the potential for allergy immunotherapy (allergy shots). This strategy can often provide long-term relief. Cockroach Allergen Avoidance Cockroaches are often found in the homes of densely populated urban areas, schools or commercial buildings, but these creatures can lurk almost anywhere. This does not mean that you have a dirty house or living area. . Block all areas where roaches can enter the home. This includes crevices, wall cracks and windows.  . Cockroaches need water to survive, so fix and seal all leaky faucets and pipes. Have an exterminator go through the house when your family and pets are gone to eliminate any remaining roaches. Marland Kitchen Keep food in lidded containers and put pet food dishes away after your pets are done eating. Vacuum and sweep the floor after meals, and take out garbage and recyclables. Use lidded garbage containers in the kitchen. Wash dishes immediately after use and clean under stoves, refrigerators or toasters  where crumbs can accumulate. Wipe off the stove and other kitchen surfaces and cupboards regularly. Control of House Dust Mite Allergen . Dust mite allergens are a common trigger of allergy and asthma symptoms. While they can be found throughout the house, these microscopic creatures thrive in warm, humid environments such as bedding, upholstered furniture and carpeting. . Because so much time is spent in the bedroom, it is essential to reduce mite levels there.  . Encase pillows, mattresses, and box springs in special allergen-proof fabric covers or airtight, zippered plastic covers.  . Bedding should be washed weekly in hot water (130 F) and dried in a hot dryer. Allergen-proof covers are available for comforters and pillows that can't be regularly washed.  Wendee Copp the allergy-proof covers every few months. Minimize clutter in the bedroom. Keep pets out of the bedroom.  Marland Kitchen Keep humidity less than 50% by using a dehumidifier or air conditioning. You can buy a humidity measuring device called a hygrometer to monitor this.  . If possible, replace carpets with hardwood, linoleum, or washable area rugs. If that's not possible, vacuum frequently with a vacuum that has a HEPA filter. . Remove all upholstered furniture and non-washable window drapes from the bedroom. . Remove all non-washable stuffed toys from the bedroom.  Wash stuffed toys weekly.  Gastroesophageal Reflux Disease, Adult  Gastroesophageal reflux (GER) happens when acid from the stomach flows up into the tube that connects the mouth and the stomach (esophagus). Normally, food travels down the esophagus and stays in the stomach to be digested. With GER, food and stomach acid sometimes move back up into the esophagus. You may have a disease called gastroesophageal reflux disease (GERD) if the reflux:  Happens often.  Causes frequent or very bad symptoms.  Causes problems such as damage to the esophagus. When this happens, the esophagus  becomes sore and swollen (inflamed). Over time, GERD can make small holes (ulcers) in the lining of the esophagus. What are the causes? This condition is caused by a problem with the muscle between the esophagus and the stomach. When this muscle is weak or not normal, it does not close properly to keep food and acid from coming back up from the stomach. The muscle can be weak because of:  Tobacco use.  Pregnancy.  Having a certain type of hernia (hiatal hernia).  Alcohol use.  Certain foods and drinks, such as coffee, chocolate, onions, and peppermint. What increases the risk? You are more likely to develop this condition if you:  Are overweight.  Have a disease that affects your connective tissue.  Use NSAID medicines. What are the signs or symptoms? Symptoms of this condition include:  Heartburn.  Difficult or painful swallowing.  The feeling of having a lump in the throat.  A bitter taste in the mouth.  Bad breath.  Having a lot of saliva.  Having an upset or bloated stomach.  Belching.  Chest pain. Different conditions can cause chest pain. Make sure you see your doctor if you have chest pain.  Shortness of breath or noisy breathing (wheezing).  Ongoing (chronic) cough or a cough at night.  Wearing away of the surface of teeth (tooth enamel).  Weight loss. How is this treated? Treatment will depend on how bad your symptoms are. Your doctor may suggest:  Changes to your diet.  Medicine.  Surgery. Follow these instructions at home: Eating and drinking   Follow a diet as told by your doctor. You may need to avoid foods and drinks such as: ? Coffee and tea (with or without caffeine). ? Drinks that contain alcohol. ? Energy drinks and sports drinks. ? Bubbly (carbonated) drinks or sodas. ? Chocolate and cocoa. ? Peppermint and mint flavorings. ? Garlic and onions. ? Horseradish. ? Spicy and acidic foods. These include peppers, chili powder, curry  powder, vinegar, hot sauces, and BBQ sauce. ? Citrus fruit juices and citrus fruits, such as oranges, lemons, and limes. ? Tomato-based foods. These include red sauce, chili, salsa, and pizza with red sauce. ? Fried and fatty foods. These include donuts, french fries, potato chips, and high-fat dressings. ? High-fat meats. These include hot dogs, rib eye steak, sausage, ham, and bacon. ? High-fat dairy items, such as whole milk, butter, and cream cheese.  Eat small meals often. Avoid eating large meals.  Avoid drinking large amounts of liquid with your meals.  Avoid eating meals during the 2-3 hours before bedtime.  Avoid lying down right after you eat.  Do not exercise right after you eat. Lifestyle   Do not use any products that contain nicotine or tobacco. These include cigarettes, e-cigarettes, and chewing tobacco. If you need help quitting, ask your doctor.  Try to lower your stress. If you need help doing this, ask your doctor.  If you are overweight, lose an amount of weight that  is healthy for you. Ask your doctor about a safe weight loss goal. General instructions  Pay attention to any changes in your symptoms.  Take over-the-counter and prescription medicines only as told by your doctor. Do not take aspirin, ibuprofen, or other NSAIDs unless your doctor says it is okay.  Wear loose clothes. Do not wear anything tight around your waist.  Raise (elevate) the head of your bed about 6 inches (15 cm).  Avoid bending over if this makes your symptoms worse.  Keep all follow-up visits as told by your doctor. This is important. Contact a doctor if:  You have new symptoms.  You lose weight and you do not know why.  You have trouble swallowing or it hurts to swallow.  You have wheezing or a cough that keeps happening.  Your symptoms do not get better with treatment.  You have a hoarse voice. Get help right away if:  You have pain in your arms, neck, jaw, teeth, or  back.  You feel sweaty, dizzy, or light-headed.  You have chest pain or shortness of breath.  You throw up (vomit) and your throw-up looks like blood or coffee grounds.  You pass out (faint).  Your poop (stool) is bloody or black.  You cannot swallow, drink, or eat. Summary  If a person has gastroesophageal reflux disease (GERD), food and stomach acid move back up into the esophagus and cause symptoms or problems such as damage to the esophagus.  Treatment will depend on how bad your symptoms are.  Follow a diet as told by your doctor.  Take all medicines only as told by your doctor. This information is not intended to replace advice given to you by your health care provider. Make sure you discuss any questions you have with your health care provider. Document Revised: 07/05/2017 Document Reviewed: 07/05/2017 Elsevier Patient Education  [place].

## 2020-06-22 ENCOUNTER — Ambulatory Visit (INDEPENDENT_AMBULATORY_CARE_PROVIDER_SITE_OTHER): Payer: PPO

## 2020-06-22 DIAGNOSIS — J309 Allergic rhinitis, unspecified: Secondary | ICD-10-CM | POA: Diagnosis not present

## 2020-06-29 ENCOUNTER — Ambulatory Visit (INDEPENDENT_AMBULATORY_CARE_PROVIDER_SITE_OTHER): Payer: PPO

## 2020-06-29 DIAGNOSIS — J309 Allergic rhinitis, unspecified: Secondary | ICD-10-CM

## 2020-07-06 ENCOUNTER — Ambulatory Visit (INDEPENDENT_AMBULATORY_CARE_PROVIDER_SITE_OTHER): Payer: PPO

## 2020-07-06 DIAGNOSIS — J309 Allergic rhinitis, unspecified: Secondary | ICD-10-CM

## 2020-07-06 DIAGNOSIS — M25442 Effusion, left hand: Secondary | ICD-10-CM | POA: Diagnosis not present

## 2020-07-06 DIAGNOSIS — M19042 Primary osteoarthritis, left hand: Secondary | ICD-10-CM | POA: Diagnosis not present

## 2020-07-07 ENCOUNTER — Encounter (INDEPENDENT_AMBULATORY_CARE_PROVIDER_SITE_OTHER): Payer: PPO | Admitting: Ophthalmology

## 2020-07-13 ENCOUNTER — Other Ambulatory Visit: Payer: Self-pay

## 2020-07-13 ENCOUNTER — Ambulatory Visit (HOSPITAL_COMMUNITY)
Admission: EM | Admit: 2020-07-13 | Discharge: 2020-07-13 | Disposition: A | Discharge status: Home / Self Care | Payer: PPO | Attending: Urgent Care | Admitting: Urgent Care

## 2020-07-13 ENCOUNTER — Encounter (HOSPITAL_COMMUNITY): Payer: Self-pay | Admitting: *Deleted

## 2020-07-13 ENCOUNTER — Ambulatory Visit (INDEPENDENT_AMBULATORY_CARE_PROVIDER_SITE_OTHER): Payer: PPO

## 2020-07-13 DIAGNOSIS — M542 Cervicalgia: Secondary | ICD-10-CM

## 2020-07-13 DIAGNOSIS — M436 Torticollis: Secondary | ICD-10-CM

## 2020-07-13 MED ORDER — TIZANIDINE HCL 4 MG PO TABS: 4.0000 mg | ORAL_TABLET | Freq: Three times a day (TID) | ORAL | 0 refills | Status: DC | PRN

## 2020-07-13 NOTE — ED Triage Notes (Signed)
Pt reports a stiff neck with neck pain turning to LT.

## 2020-07-13 NOTE — ED Provider Notes (Signed)
[facility]   MRN: [id number] DOB: 22-May-1945  Subjective:   Marcus Gomez. is a 75 y.o. male presenting for 1 week history of persistent intermittent stiffness and spasms of his neck.  Reports a history of this, has had back spasms in his back but not necessarily his neck.  Has previously had been bad enough that he had to be seen in the emergency room.  Denies recent fall, trauma, numbness, shooting pains, tingling, weakness, heavy lifting.  Imaging from 2018 shows mild degenerative changes at the cervical level.  No current facility-administered medications for this encounter.  Current Outpatient Medications:    celecoxib (CELEBREX) 200 MG capsule, Take 200 mg by mouth every other day., Disp: , Rfl:    EPINEPHrine 0.3 mg/0.3 mL IJ SOAJ injection, Inject 0.3 mLs (0.3 mg total) into the muscle once as needed for up to 1 dose for anaphylaxis., Disp: 0.3 mL, Rfl: 1   losartan (COZAAR) 100 MG tablet, Take 1 tablet (100 mg total) by mouth daily., Disp: 90 tablet, Rfl: 1   montelukast (SINGULAIR) 10 MG tablet, TAKE 1 TABLET BY MOUTH AT BEDTIME, Disp: 90 tablet, Rfl: 2   Multiple Vitamins-Minerals (ONE-A-DAY MENS 50+ PO), Take by mouth., Disp: , Rfl:    Multiple Vitamins-Minerals (PRESERVISION AREDS 2+MULTI VIT PO), Take 1 capsule by mouth in the morning and at bedtime., Disp: , Rfl:    omeprazole (PRILOSEC) 20 MG capsule, Take 1 capsule (20 mg total) by mouth daily as needed (reflux)., Disp: 30 capsule, Rfl: 5   rosuvastatin (CRESTOR) 5 MG tablet, Take 1 tablet (5 mg total) by mouth every other day., Disp: 45 tablet, Rfl: 3   Turmeric (QC TUMERIC COMPLEX PO), Take 1,000 mg by mouth in the morning and at bedtime., Disp: , Rfl:    Allergies  Allergen Reactions   Lisinopril Cough    Past Medical History:  Diagnosis Date   Essential hypertension    Prostate cancer ([place])    Remission     Past Surgical History:  Procedure Laterality Date   KNEE SURGERY  Bilateral 2015 & 2014   SHOULDER SURGERY Right 2016    Family History  Problem Relation Age of Onset   Stroke Mother    Allergies Father    COPD Father        never smoker   Rheum arthritis Father    Allergic rhinitis Neg Hx    Angioedema Neg Hx    Asthma Neg Hx    Eczema Neg Hx    Immunodeficiency Neg Hx    Urticaria Neg Hx     Social History   Tobacco Use   Smoking status: Never   Smokeless tobacco: Never  Vaping Use   Vaping Use: Never used  Substance Use Topics   Alcohol use: Yes    Comment: socially    Drug use: No    ROS   Objective:   Vitals: BP (!) 155/72   Pulse 77   Temp 98.8 F (37.1 C)   Resp 20   SpO2 97%   Physical Exam Constitutional:      General: He is not in acute distress.    Appearance: Normal appearance. He is well-developed and normal weight. He is not ill-appearing, toxic-appearing or diaphoretic.  HENT:     Head: Normocephalic and atraumatic.     Right Ear: External ear normal.     Left Ear: External ear normal.     Nose: Nose normal.  Mouth/Throat:     Pharynx: Oropharynx is clear.  Eyes:     General: No scleral icterus.       Right eye: No discharge.        Left eye: No discharge.     Extraocular Movements: Extraocular movements intact.     Pupils: Pupils are equal, round, and reactive to light.  Cardiovascular:     Rate and Rhythm: Normal rate.  Pulmonary:     Effort: Pulmonary effort is normal.  Musculoskeletal:     Cervical back: Spasms, torticollis and tenderness (not the midline but on either side over paraspinal muscles extending into trapezius) present. No swelling, edema, deformity, erythema, signs of trauma, lacerations, rigidity, bony tenderness or crepitus. Pain with movement present. Decreased range of motion.  Skin:    General: Skin is warm and dry.  Neurological:     Mental Status: He is alert and oriented to person, place, and time.     Motor: No weakness.     Coordination: Coordination normal.      Gait: Gait normal.     Deep Tendon Reflexes: Reflexes normal.  Psychiatric:        Mood and Affect: Mood normal.        Behavior: Behavior normal.        Thought Content: Thought content normal.        Judgment: Judgment normal.     Assessment and Plan :   PDMP not reviewed this encounter.  1. Torticollis     Imaging reviewed with Dr. Mannie Stabile, appears to have loss of normal lordosis consistent with spasms and torticollis.  Recommended Tylenol, tizanidine.  Follow-up with the neuro spine clinic if symptoms persist.  We will update patient with any abnormal findings on his cervical x-ray over read.  Counseled patient on potential for adverse effects with medications prescribed/recommended today, ER and return-to-clinic precautions discussed, patient verbalized understanding.    Jaynee Eagles, PA-C 07/13/20 1312

## 2020-07-13 NOTE — Discharge Instructions (Addendum)
Please use Tylenol at a dose of 500mg -650mg  once every 6 hours as needed for your neck pain.  It is okay to use this together with tizanidine.  If you continue to have neck pain and difficulty moving her neck, then please follow-up with a spine specialist.  Do not use any nonsteroidal anti-inflammatories (NSAIDs) like ibuprofen, Motrin, naproxen, Aleve, etc. which are all available over-the-counter.

## 2020-07-14 ENCOUNTER — Telehealth (HOSPITAL_COMMUNITY): Payer: Self-pay | Admitting: Urgent Care

## 2020-07-14 MED ORDER — PREDNISONE 20 MG PO TABS: ORAL_TABLET | ORAL | 0 refills | Status: DC

## 2020-07-14 NOTE — Progress Notes (Signed)
Discussed result with patient and his mother.  Decided not to refer to the emergency room for rule out of a chest PE.  Will manage with supportive care as discussed in clinic, start prednisone course for asthma in the setting of COVID-19.

## 2020-07-14 NOTE — Telephone Encounter (Signed)
Recommended an oral prednisone course for ongoing neck pain in the setting of advanced moderate degenerative disc disease at the cervical level.  He may continue using Tylenol and tizanidine.  Follow-up with spine specialist as planned at his office visit.

## 2020-07-17 ENCOUNTER — Ambulatory Visit (INDEPENDENT_AMBULATORY_CARE_PROVIDER_SITE_OTHER): Payer: PPO

## 2020-07-17 DIAGNOSIS — J309 Allergic rhinitis, unspecified: Secondary | ICD-10-CM | POA: Diagnosis not present

## 2020-07-23 ENCOUNTER — Ambulatory Visit (INDEPENDENT_AMBULATORY_CARE_PROVIDER_SITE_OTHER): Payer: PPO | Admitting: *Deleted

## 2020-07-23 ENCOUNTER — Other Ambulatory Visit: Payer: Self-pay

## 2020-07-23 DIAGNOSIS — C61 Malignant neoplasm of prostate: Secondary | ICD-10-CM | POA: Diagnosis not present

## 2020-07-23 DIAGNOSIS — J309 Allergic rhinitis, unspecified: Secondary | ICD-10-CM

## 2020-07-24 ENCOUNTER — Encounter: Payer: Self-pay | Admitting: Adult Health

## 2020-07-24 ENCOUNTER — Ambulatory Visit (INDEPENDENT_AMBULATORY_CARE_PROVIDER_SITE_OTHER): Payer: PPO | Admitting: Adult Health

## 2020-07-24 VITALS — BP 108/64 | HR 97 | Temp 98.3°F | Ht 70.75 in | Wt 236.0 lb

## 2020-07-24 DIAGNOSIS — J3089 Other allergic rhinitis: Secondary | ICD-10-CM

## 2020-07-24 DIAGNOSIS — M436 Torticollis: Secondary | ICD-10-CM

## 2020-07-24 DIAGNOSIS — I10 Essential (primary) hypertension: Secondary | ICD-10-CM

## 2020-07-24 DIAGNOSIS — Z1159 Encounter for screening for other viral diseases: Secondary | ICD-10-CM | POA: Diagnosis not present

## 2020-07-24 DIAGNOSIS — Z8546 Personal history of malignant neoplasm of prostate: Secondary | ICD-10-CM | POA: Diagnosis not present

## 2020-07-24 DIAGNOSIS — Z0000 Encounter for general adult medical examination without abnormal findings: Secondary | ICD-10-CM

## 2020-07-24 DIAGNOSIS — E782 Mixed hyperlipidemia: Secondary | ICD-10-CM

## 2020-07-24 DIAGNOSIS — K219 Gastro-esophageal reflux disease without esophagitis: Secondary | ICD-10-CM | POA: Diagnosis not present

## 2020-07-24 LAB — CBC WITH DIFFERENTIAL/PLATELET
Basophils Absolute: 0 10*3/uL (ref 0.0–0.1)
Basophils Relative: 0.3 % (ref 0.0–3.0)
Eosinophils Absolute: 0.1 10*3/uL (ref 0.0–0.7)
Eosinophils Relative: 0.6 % (ref 0.0–5.0)
HCT: 41 % (ref 39.0–52.0)
Hemoglobin: 14 g/dL (ref 13.0–17.0)
Lymphocytes Relative: 20.3 % (ref 12.0–46.0)
Lymphs Abs: 1.8 10*3/uL (ref 0.7–4.0)
MCHC: 34.1 g/dL (ref 30.0–36.0)
MCV: 88 fl (ref 78.0–100.0)
Monocytes Absolute: 0.8 10*3/uL (ref 0.1–1.0)
Monocytes Relative: 9.5 % (ref 3.0–12.0)
Neutro Abs: 6.1 10*3/uL (ref 1.4–7.7)
Neutrophils Relative %: 69.3 % (ref 43.0–77.0)
Platelets: 195 10*3/uL (ref 150.0–400.0)
RBC: 4.66 Mil/uL (ref 4.22–5.81)
RDW: 13.5 % (ref 11.5–15.5)
WBC: 8.8 10*3/uL (ref 4.0–10.5)

## 2020-07-24 LAB — PSA: PSA: 0 ng/mL — ABNORMAL LOW (ref 0.10–4.00)

## 2020-07-24 LAB — LIPID PANEL
Cholesterol: 148 mg/dL (ref 0–200)
HDL: 51.5 mg/dL (ref >39.00)
LDL Cholesterol: 77 mg/dL (ref 0–99)
NonHDL: 96.03
Total CHOL/HDL Ratio: 3
Triglycerides: 93 mg/dL (ref 0.0–149.0)
VLDL: 18.6 mg/dL (ref 0.0–40.0)

## 2020-07-24 LAB — COMPREHENSIVE METABOLIC PANEL
ALT: 21 U/L (ref 0–53)
AST: 15 U/L (ref 0–37)
Albumin: 4.3 g/dL (ref 3.5–5.2)
Alkaline Phosphatase: 77 U/L (ref 39–117)
BUN: 10 mg/dL (ref 6–23)
CO2: 26 mEq/L (ref 19–32)
Calcium: 9.5 mg/dL (ref 8.4–10.5)
Chloride: 102 mEq/L (ref 96–112)
Creatinine, Ser: 0.91 mg/dL (ref 0.40–1.50)
GFR: 82.51 mL/min (ref >60.00)
Glucose, Bld: 108 mg/dL — ABNORMAL HIGH (ref 70–99)
Potassium: 4.5 mEq/L (ref 3.5–5.1)
Sodium: 137 mEq/L (ref 135–145)
Total Bilirubin: 1.9 mg/dL — ABNORMAL HIGH (ref 0.2–1.2)
Total Protein: 6.8 g/dL (ref 6.0–8.3)

## 2020-07-24 LAB — TSH: TSH: 1.31 u[IU]/mL (ref 0.35–5.50)

## 2020-07-24 MED ORDER — CYCLOBENZAPRINE HCL 10 MG PO TABS: 10.0000 mg | ORAL_TABLET | Freq: Three times a day (TID) | ORAL | 0 refills | Status: DC | PRN

## 2020-07-24 NOTE — Patient Instructions (Signed)
It was great seeing you today   We will follow up with you regarding your blood work   I have sent in a muscle relaxer called Flexeril for you

## 2020-07-24 NOTE — Progress Notes (Signed)
Subjective:    Patient ID: Marcus Gomez., male    DOB: [birth year], 75 y.o.   MRN: [id number]  HPI Patient presents for yearly preventative medicine examination. He is a pleasant 75 year old male who  has a past medical history of Essential hypertension and Prostate cancer ([place]).  Hypertension -prescribed Cozaar 100 mg daily.  He does not monitor his blood pressure at home on a routine basis.  Denies dizziness, lightheadedness, chest pain, shortness of breath BP Readings from Last 3 Encounters:  07/24/20 108/64  07/13/20 (!) 155/72  06/15/20 138/64   Hyperlipidemia - Prescribed Crestor 5 mg every other day.  Denies myalgia or fatigue  Lab Results  Component Value Date   CHOL 187 07/24/2019   HDL 51 07/24/2019   LDLCALC 114 (H) 07/24/2019   TRIG 112 07/24/2019   CHOLHDL 3.7 07/24/2019    H/o prostate cancer -been followed by urology every three months. Has been cancer free for 19 years Lab Results  Component Value Date   PSA <0.1 07/24/2019   PSA 0.01 (L) 01/25/2018   PSA 0.01 (L) 03/16/2016   Seasonal Allergies -seen by allergy and asthma.  Receives immunotherapy injections and takes Singulair QHS.   GERD - takes prilosec 20mg  daily.   Torticollis -was seen on 07/13/2020 in the emergency room and diagnosed with torticollis.  Was prescribed a prednisone course as well as Zanaflex and reports that the prednisone really helped but then as soon as the course was over his discomfort came back.  Zanaflex was helping to some degree but he continues to have pain.  He has been seen in 3 days by neurosurgery.  He is wondering if I can send in a muscle relaxer to help until that time  All immunizations and health maintenance protocols were reviewed with the patient and needed orders were placed.  Appropriate screening laboratory values were ordered for the patient including screening of hyperlipidemia, renal function and hepatic function. If indicated by BPH, a PSA was  ordered.  Medication reconciliation,  past medical history, social history, problem list and allergies were reviewed in detail with the patient  Goals were established with regard to weight loss, exercise, and  diet in compliance with medications  He is up to date on routine colon cancer screening. Last colonoscopy in 2014 - 10 year plan    Review of Systems  Constitutional: Negative.   HENT: Negative.    Eyes: Negative.   Respiratory: Negative.    Cardiovascular: Negative.   Gastrointestinal: Negative.   Endocrine: Negative.   Genitourinary: Negative.   Musculoskeletal:  Positive for arthralgias and back pain.  Skin: Negative.   Allergic/Immunologic: Negative.   Neurological: Negative.   Hematological: Negative.   Psychiatric/Behavioral: Negative.    All other systems reviewed and are negative.  Past Medical History:  Diagnosis Date   Essential hypertension    Prostate cancer ([place])    Remission    Social History   Socioeconomic History   Marital status: Divorced    Spouse name: Not on file   Number of children: Not on file   Years of education: Not on file   Highest education level: Not on file  Occupational History   Not on file  Tobacco Use   Smoking status: Never   Smokeless tobacco: Never  Vaping Use   Vaping Use: Never used  Substance and Sexual Activity   Alcohol use: Yes    Comment: socially    Drug use: No  Sexual activity: Yes  Other Topics Concern   Not on file  Social History Narrative   Retired - Biomedical scientist    Divorced x 4    Two children - 2 girls both live Radiation protection practitioner.       Social Determinants of Health   Financial Resource Strain: Low Risk    Difficulty of Paying Living Expenses: Not hard at all  Food Insecurity: No Food Insecurity   Worried About Charity fundraiser in the Last Year: Never true   Arboriculturist in the Last Year: Never true  Transportation Needs: No Transportation Needs   Lack of Transportation (Medical): No   Lack  of Transportation (Non-Medical): No  Physical Activity: Sufficiently Active   Days of Exercise per Week: 7 days   Minutes of Exercise per Session: 30 min  Stress: No Stress Concern Present   Feeling of Stress : Not at all  Social Connections: Moderately Integrated   Frequency of Communication with Friends and Family: Never   Frequency of Social Gatherings with Friends and Family: More than three times a week   Attends Religious Services: 1 to 4 times per year   Active Member of [employer] or Organizations: Yes   Attends Music therapist: More than 4 times per year   Marital Status: Divorced  Human resources officer Violence: Not At Risk   Fear of Current or Ex-Partner: No   Emotionally Abused: No   Physically Abused: No   Sexually Abused: No    Past Surgical History:  Procedure Laterality Date   KNEE SURGERY Bilateral 2015 & 2014   SHOULDER SURGERY Right 2016    Family History  Problem Relation Age of Onset   Stroke Mother    Allergies Father    COPD Father        never smoker   Rheum arthritis Father    Allergic rhinitis Neg Hx    Angioedema Neg Hx    Asthma Neg Hx    Eczema Neg Hx    Immunodeficiency Neg Hx    Urticaria Neg Hx     Allergies  Allergen Reactions   Lisinopril Cough    Current Outpatient Medications on File Prior to Visit  Medication Sig Dispense Refill   losartan (COZAAR) 100 MG tablet Take 1 tablet (100 mg total) by mouth daily. 90 tablet 1   montelukast (SINGULAIR) 10 MG tablet TAKE 1 TABLET BY MOUTH AT BEDTIME 90 tablet 2   Multiple Vitamins-Minerals (ONE-A-DAY MENS 50+ PO) Take by mouth.     Multiple Vitamins-Minerals (PRESERVISION AREDS 2+MULTI VIT PO) Take 1 capsule by mouth in the morning and at bedtime.     omeprazole (PRILOSEC) 20 MG capsule Take 1 capsule (20 mg total) by mouth daily as needed (reflux). 30 capsule 5   Propylene Glycol 0.6 % SOLN Apply to eye. BID     rosuvastatin (CRESTOR) 5 MG tablet Take 1 tablet (5 mg total) by mouth  every other day. 45 tablet 3   tiZANidine (ZANAFLEX) 4 MG tablet Take 1 tablet (4 mg total) by mouth every 8 (eight) hours as needed. 30 tablet 0   Turmeric (QC TUMERIC COMPLEX PO) Take 1,000 mg by mouth in the morning and at bedtime.     celecoxib (CELEBREX) 200 MG capsule Take 200 mg by mouth every other day. (Patient not taking: Reported on 07/24/2020)     EPINEPHrine 0.3 mg/0.3 mL IJ SOAJ injection Inject 0.3 mLs (0.3 mg total) into the muscle once as  needed for up to 1 dose for anaphylaxis. (Patient not taking: Reported on 07/24/2020) 0.3 mL 1   No current facility-administered medications on file prior to visit.    BP 108/64   Pulse 97   Temp 98.3 F (36.8 C) (Oral)   Ht 5' 10.75" (1.797 m)   Wt 236 lb (107 kg)   SpO2 97%   BMI 33.15 kg/m        Objective:   Physical Exam Vitals and nursing note reviewed.  Constitutional:      General: He is not in acute distress.    Appearance: Normal appearance. He is well-developed. He is obese.  HENT:     Head: Normocephalic and atraumatic.     Right Ear: Tympanic membrane, ear canal and external ear normal. There is no impacted cerumen.     Left Ear: Tympanic membrane, ear canal and external ear normal. There is no impacted cerumen.     Nose: Nose normal. No congestion or rhinorrhea.     Mouth/Throat:     Mouth: Mucous membranes are moist.     Pharynx: Oropharynx is clear. No oropharyngeal exudate or posterior oropharyngeal erythema.  Eyes:     General:        Right eye: No discharge.        Left eye: No discharge.     Extraocular Movements: Extraocular movements intact.     Conjunctiva/sclera: Conjunctivae normal.     Pupils: Pupils are equal, round, and reactive to light.  Neck:     Vascular: No carotid bruit.     Trachea: No tracheal deviation.  Cardiovascular:     Rate and Rhythm: Normal rate and regular rhythm.     Pulses: Normal pulses.     Heart sounds: Normal heart sounds. No murmur heard.   No friction rub. No  gallop.  Pulmonary:     Effort: Pulmonary effort is normal. No respiratory distress.     Breath sounds: Normal breath sounds. No stridor. No wheezing, rhonchi or rales.  Chest:     Chest wall: No tenderness.  Abdominal:     General: Bowel sounds are normal. There is no distension.     Palpations: Abdomen is soft. There is no mass.     Tenderness: There is no abdominal tenderness. There is no right CVA tenderness, left CVA tenderness, guarding or rebound.     Hernia: No hernia is present.  Musculoskeletal:        General: No swelling, deformity or signs of injury.     Cervical back: Torticollis and tenderness present. Decreased range of motion.       Back:     Right lower leg: No edema.     Left lower leg: No edema.  Lymphadenopathy:     Cervical: No cervical adenopathy.  Skin:    General: Skin is warm and dry.     Capillary Refill: Capillary refill takes less than 2 seconds.     Coloration: Skin is not jaundiced or pale.     Findings: No bruising, erythema, lesion or rash.  Neurological:     General: No focal deficit present.     Mental Status: He is alert and oriented to person, place, and time.     Cranial Nerves: No cranial nerve deficit.     Sensory: No sensory deficit.     Motor: No weakness.     Coordination: Coordination normal.     Gait: Gait normal.     Deep Tendon Reflexes: Reflexes  normal.  Psychiatric:        Mood and Affect: Mood normal.        Behavior: Behavior normal.        Thought Content: Thought content normal.        Judgment: Judgment normal.      Assessment & Plan:   1. Routine general medical examination at a health care facility - Encouraged exercise and heart healthy diet to lose weight  = Follow up in one year or sooner if needed - CBC with Differential/Platelet; Future - Comprehensive metabolic panel; Future - Lipid panel; Future - TSH; Future - PSA; Future  2. Essential hypertension - Controlled. No change in medications - CBC with  Differential/Platelet; Future - Comprehensive metabolic panel; Future - Lipid panel; Future - TSH; Future - PSA; Future  3. Mixed hyperlipidemia - Consider increase dose of statin  - CBC with Differential/Platelet; Future - Comprehensive metabolic panel; Future - Lipid panel; Future - TSH; Future - PSA; Future  4. History of prostate cancer - Follow up with Urology as directed - PSA; Future  5. Gastroesophageal reflux disease, unspecified whether esophagitis present - Continue with prilosec - CBC with Differential/Platelet; Future - Comprehensive metabolic panel; Future - Lipid panel; Future - TSH; Future - PSA; Future  6. Perennial and seasonal allergic rhinitis - Continue with plan of care by allergy and asthma   7. Need for hepatitis C screening test  - Hep C Antibody; Future  8. Torticollis  - cyclobenzaprine (FLEXERIL) 10 MG tablet; Take 1 tablet (10 mg total) by mouth 3 (three) times daily as needed for muscle spasms.  Dispense: 30 tablet; Refill: 0    [name], NP

## 2020-07-24 NOTE — Addendum Note (Signed)
Addended by: Amanda Cockayne on: 07/24/2020 09:06 AM   Modules accepted: Orders

## 2020-07-27 LAB — HEPATITIS C ANTIBODY
Hepatitis C Ab: NONREACTIVE
SIGNAL TO CUT-OFF: 0.01 (ref <1.00)

## 2020-07-30 DIAGNOSIS — M47812 Spondylosis without myelopathy or radiculopathy, cervical region: Secondary | ICD-10-CM | POA: Diagnosis not present

## 2020-08-03 ENCOUNTER — Telehealth: Payer: Self-pay | Admitting: Pharmacist

## 2020-08-03 ENCOUNTER — Ambulatory Visit (INDEPENDENT_AMBULATORY_CARE_PROVIDER_SITE_OTHER): Payer: PPO

## 2020-08-03 DIAGNOSIS — J309 Allergic rhinitis, unspecified: Secondary | ICD-10-CM | POA: Diagnosis not present

## 2020-08-03 NOTE — Progress Notes (Addendum)
Chronic Care Management Pharmacy Assistant   Name: Marcus Gomez.  MRN: [id number] DOB: 07/17/1945   Reason for Encounter: Disease State Hypertension Assessment Call   Conditions to be addressed/monitored: HTN  Recent office visits:  08-03-2020 [name], NP (PCP) - Patient was seen for Routine general medical examination an other issues. Zanaflex '4mg'$  Stopped/ Prednisone '20mg'$  Stopped. Cyclobenzaprine '10mg'$  three times a day PRN added.  Recent consult visits:   07-06-2020 Sheran Luz (ortho) - Patient was seen for Primary osteoarthritis of left hand and other issues follow -up. No medication changes noted.  06-15-2020 Garnet Sierras, DO (allergy assoc) - Patient was seen for Perennial and seasonal allergic rhinitis and other issues. Omeprazole '20mg'$  changed from '40mg'$  PRN. Budosenide 0.'5mg'$ /42m stopped, Fluticasone '50mg'$  stopped, Ipatropium Bromide 0.03% stopped.  05-25-2020 YGarnet Sierras(allergy/asthma) - Notes unavailable  Hospital visits:  Medication Reconciliation was completed by comparing discharge summary, patient's EMR and Pharmacy list, and upon discussion with patient.  [facility]  [facility] Torticollis. Discharge date was 07/13/2020. Patient spent 38 min at Urgent Care.  [facility] -started Tizanidine '4mg'$   every 8 hours PRN   Medication Changes at Hospital Discharge: -Changed None  Medications Discontinued at Hospital Discharge: -Stopped None  Medications that remain the same after Hospital Discharge:??  -All other medications will remain the same.    Medications: Outpatient Encounter Medications as of 08/03/2020  Medication Sig   celecoxib (CELEBREX) 200 MG capsule Take 200 mg by mouth every other day. (Patient not taking: Reported on 07/24/2020)   cyclobenzaprine (FLEXERIL) 10 MG tablet Take 1 tablet (10 mg total) by mouth 3 (three) times daily as needed for muscle spasms.    EPINEPHrine 0.3 mg/0.3 mL IJ SOAJ injection Inject 0.3 mLs (0.3 mg total) into the muscle once as needed for up to 1 dose for anaphylaxis. (Patient not taking: Reported on 07/24/2020)   losartan (COZAAR) 100 MG tablet Take 1 tablet (100 mg total) by mouth daily.   montelukast (SINGULAIR) 10 MG tablet TAKE 1 TABLET BY MOUTH AT BEDTIME   Multiple Vitamins-Minerals (ONE-A-DAY MENS 50+ PO) Take by mouth.   Multiple Vitamins-Minerals (PRESERVISION AREDS 2+MULTI VIT PO) Take 1 capsule by mouth in the morning and at bedtime.   omeprazole (PRILOSEC) 20 MG capsule Take 1 capsule (20 mg total) by mouth daily as needed (reflux).   Propylene Glycol 0.6 % SOLN Apply to eye. BID   rosuvastatin (CRESTOR) 5 MG tablet Take 1 tablet (5 mg total) by mouth every other day.   Turmeric (QC TUMERIC COMPLEX PO) Take 1,000 mg by mouth in the morning and at bedtime.   No facility-administered encounter medications on file as of 08/03/2020.   Reviewed chart prior to disease state call. Spoke with patient regarding BP  Recent Office Vitals: BP Readings from Last 3 Encounters:  07/24/20 108/64  07/13/20 (!) 155/72  06/15/20 138/64   Pulse Readings from Last 3 Encounters:  07/24/20 97  07/13/20 77  06/15/20 66    Wt Readings from Last 3 Encounters:  07/24/20 236 lb (107 kg)  06/15/20 244 lb 6.4 oz (110.9 kg)  04/23/20 241 lb 3.2 oz (109.4 kg)     Kidney Function Lab Results  Component Value Date/Time   CREATININE 0.91 07/24/2020 09:06 AM   CREATININE 0.88 07/24/2019 08:23 AM   CREATININE 0.92 01/25/2018 03:14 PM   GFR 82.51 07/24/2020 09:06 AM   GFRNONAA 73 (L)  12/09/2012 01:15 PM   GFRAA 85 (L) 12/09/2012 01:15 PM    BMP Latest Ref Rng & Units 07/24/2020 07/24/2019 01/25/2018  Glucose 70 - 99 mg/dL 108(H) 111(H) 94  BUN 6 - 23 mg/dL '10 14 13  '$ Creatinine 0.40 - 1.50 mg/dL 0.91 0.88 0.92  BUN/Creat Ratio 6 - 22 (calc) - NOT APPLICABLE -  Sodium A999333 - 145 mEq/L 137 138 138  Potassium 3.5 - 5.1 mEq/L 4.5  4.7 4.7  Chloride 96 - 112 mEq/L 102 104 104  CO2 19 - 32 mEq/L '26 24 27  '$ Calcium 8.4 - 10.5 mg/dL 9.5 9.3 9.8    Current antihypertensive regimen:  Losartan 100 mg 1 tablet daily How often are you checking your Blood Pressure? infrequently Patient reports that he checks his pressures when he remembers typically every week or so  Current home BP readings: Patient reports he recalls it running around 110/75 What recent interventions/DTPs have been made by any provider to improve Blood Pressure control since last CPP Visit: None Any recent hospitalizations or ED visits since last visit with CPP? Yes [facility] What diet changes have been made to improve Blood Pressure Control?  Patient reports for Breakfast he will have cereal and fruit, For lunch a tomato or banana sandwich, For dinner he tends to eat at home will have a hot dog, burger,steak and salad or lasagna. What exercise is being done to improve your Blood Pressure Control?  Patient reports that he stays pretty active and is outdoors most of the day daily.  Adherence Review: Is the patient currently on ACE/ARB medication? Yes Does the patient have >5 day gap between last estimated fill dates? No  Care Gaps:  TDAP - Overdue Zostervax - Overdue COVID Booster #4 (Moderna) - Overdue CPP Phone visit scheduled 10/19/2020 2pm AWV was 02/11/21  Star Rating Drugs:  Rosuvastatin '5mg'$  - Last filled 05-22-2020 90DS at [facility] Losartan '100mg'$  - Last filled 07-31-2020 90DS at [facility] Pharmacist Assistant [phone]

## 2020-08-10 DIAGNOSIS — M47812 Spondylosis without myelopathy or radiculopathy, cervical region: Secondary | ICD-10-CM | POA: Diagnosis not present

## 2020-08-10 DIAGNOSIS — M542 Cervicalgia: Secondary | ICD-10-CM | POA: Diagnosis not present

## 2020-08-14 DIAGNOSIS — M47812 Spondylosis without myelopathy or radiculopathy, cervical region: Secondary | ICD-10-CM | POA: Diagnosis not present

## 2020-08-14 DIAGNOSIS — M542 Cervicalgia: Secondary | ICD-10-CM | POA: Diagnosis not present

## 2020-08-17 DIAGNOSIS — M47812 Spondylosis without myelopathy or radiculopathy, cervical region: Secondary | ICD-10-CM | POA: Diagnosis not present

## 2020-08-17 DIAGNOSIS — M542 Cervicalgia: Secondary | ICD-10-CM | POA: Diagnosis not present

## 2020-08-18 ENCOUNTER — Ambulatory Visit (INDEPENDENT_AMBULATORY_CARE_PROVIDER_SITE_OTHER): Payer: PPO | Admitting: *Deleted

## 2020-08-18 DIAGNOSIS — J309 Allergic rhinitis, unspecified: Secondary | ICD-10-CM | POA: Diagnosis not present

## 2020-08-20 DIAGNOSIS — M542 Cervicalgia: Secondary | ICD-10-CM | POA: Diagnosis not present

## 2020-08-20 DIAGNOSIS — M47812 Spondylosis without myelopathy or radiculopathy, cervical region: Secondary | ICD-10-CM | POA: Diagnosis not present

## 2020-08-24 DIAGNOSIS — M47812 Spondylosis without myelopathy or radiculopathy, cervical region: Secondary | ICD-10-CM | POA: Diagnosis not present

## 2020-08-24 DIAGNOSIS — M542 Cervicalgia: Secondary | ICD-10-CM | POA: Diagnosis not present

## 2020-08-28 DIAGNOSIS — M47812 Spondylosis without myelopathy or radiculopathy, cervical region: Secondary | ICD-10-CM | POA: Diagnosis not present

## 2020-08-28 DIAGNOSIS — M542 Cervicalgia: Secondary | ICD-10-CM | POA: Diagnosis not present

## 2020-08-31 DIAGNOSIS — M542 Cervicalgia: Secondary | ICD-10-CM | POA: Diagnosis not present

## 2020-08-31 DIAGNOSIS — M47812 Spondylosis without myelopathy or radiculopathy, cervical region: Secondary | ICD-10-CM | POA: Diagnosis not present

## 2020-09-03 DIAGNOSIS — M47812 Spondylosis without myelopathy or radiculopathy, cervical region: Secondary | ICD-10-CM | POA: Diagnosis not present

## 2020-09-03 DIAGNOSIS — M542 Cervicalgia: Secondary | ICD-10-CM | POA: Diagnosis not present

## 2020-09-07 ENCOUNTER — Encounter (INDEPENDENT_AMBULATORY_CARE_PROVIDER_SITE_OTHER): Payer: PPO | Admitting: Ophthalmology

## 2020-09-07 DIAGNOSIS — M47812 Spondylosis without myelopathy or radiculopathy, cervical region: Secondary | ICD-10-CM | POA: Diagnosis not present

## 2020-09-07 DIAGNOSIS — M542 Cervicalgia: Secondary | ICD-10-CM | POA: Diagnosis not present

## 2020-09-10 DIAGNOSIS — M47812 Spondylosis without myelopathy or radiculopathy, cervical region: Secondary | ICD-10-CM | POA: Diagnosis not present

## 2020-09-10 DIAGNOSIS — M542 Cervicalgia: Secondary | ICD-10-CM | POA: Diagnosis not present

## 2020-09-15 DIAGNOSIS — M47812 Spondylosis without myelopathy or radiculopathy, cervical region: Secondary | ICD-10-CM | POA: Diagnosis not present

## 2020-09-15 DIAGNOSIS — M542 Cervicalgia: Secondary | ICD-10-CM | POA: Diagnosis not present

## 2020-09-16 ENCOUNTER — Ambulatory Visit (INDEPENDENT_AMBULATORY_CARE_PROVIDER_SITE_OTHER): Payer: PPO

## 2020-09-16 DIAGNOSIS — J309 Allergic rhinitis, unspecified: Secondary | ICD-10-CM

## 2020-09-18 DIAGNOSIS — M542 Cervicalgia: Secondary | ICD-10-CM | POA: Diagnosis not present

## 2020-09-18 DIAGNOSIS — M47812 Spondylosis without myelopathy or radiculopathy, cervical region: Secondary | ICD-10-CM | POA: Diagnosis not present

## 2020-09-21 DIAGNOSIS — M542 Cervicalgia: Secondary | ICD-10-CM | POA: Diagnosis not present

## 2020-09-21 DIAGNOSIS — J3089 Other allergic rhinitis: Secondary | ICD-10-CM | POA: Diagnosis not present

## 2020-09-21 DIAGNOSIS — M47812 Spondylosis without myelopathy or radiculopathy, cervical region: Secondary | ICD-10-CM | POA: Diagnosis not present

## 2020-09-21 NOTE — Progress Notes (Signed)
VIALS MADE. EXP 09-21-21

## 2020-09-22 DIAGNOSIS — J3081 Allergic rhinitis due to animal (cat) (dog) hair and dander: Secondary | ICD-10-CM | POA: Diagnosis not present

## 2020-09-24 ENCOUNTER — Encounter (INDEPENDENT_AMBULATORY_CARE_PROVIDER_SITE_OTHER): Payer: PPO | Admitting: Ophthalmology

## 2020-09-24 ENCOUNTER — Other Ambulatory Visit: Payer: Self-pay

## 2020-09-24 DIAGNOSIS — H353132 Nonexudative age-related macular degeneration, bilateral, intermediate dry stage: Secondary | ICD-10-CM | POA: Diagnosis not present

## 2020-09-24 DIAGNOSIS — H35033 Hypertensive retinopathy, bilateral: Secondary | ICD-10-CM

## 2020-09-24 DIAGNOSIS — I10 Essential (primary) hypertension: Secondary | ICD-10-CM | POA: Diagnosis not present

## 2020-09-24 DIAGNOSIS — H43813 Vitreous degeneration, bilateral: Secondary | ICD-10-CM

## 2020-09-29 ENCOUNTER — Ambulatory Visit (INDEPENDENT_AMBULATORY_CARE_PROVIDER_SITE_OTHER): Payer: PPO | Admitting: *Deleted

## 2020-09-29 DIAGNOSIS — J309 Allergic rhinitis, unspecified: Secondary | ICD-10-CM

## 2020-09-30 DIAGNOSIS — M47812 Spondylosis without myelopathy or radiculopathy, cervical region: Secondary | ICD-10-CM | POA: Diagnosis not present

## 2020-10-16 ENCOUNTER — Telehealth: Payer: Self-pay | Admitting: Pharmacist

## 2020-10-16 NOTE — Chronic Care Management (AMB) (Signed)
    Chronic Care Management Pharmacy Assistant   Name: Marcus Gomez.  MRN: [id number] DOB: September 29, 1945  10-7-202 APPOINTMENT REMINDER   [facility] No answer, left message of appointment on 10-19-2020 at 2 via telephone visit with [name], Pharm D. Notified to have all medications, supplements, blood pressure and/or blood sugar logs available during appointment and to return call if need to reschedule.    Care Gaps:  TDAP - Overdue Zostervax - Overdue COVID Booster #4 Levan Hurst) - Overdue CPP Phone visit scheduled 10/19/2020 2pm AWV was 02/11/21  Flu Vaccine - Overdue BP -108/64 (07-2020)  Star Rating Drugs:   Rosuvastatin 5mg  - Last filled 05-22-2020 90DS at [facility] Losartan 100mg  - Last filled 07-31-2020 90[facility] at [facility]  Any gaps in medications fill history? Call to [facility] to verify fill history date for Rosuvastatin spoke to kim she advised that the above date is accurate   Medications: Outpatient Encounter Medications as of 10/16/2020  Medication Sig   celecoxib (CELEBREX) 200 MG capsule Take 200 mg by mouth every other day. (Patient not taking: Reported on 07/24/2020)   cyclobenzaprine (FLEXERIL) 10 MG tablet Take 1 tablet (10 mg total) by mouth 3 (three) times daily as needed for muscle spasms.   EPINEPHrine 0.3 mg/0.3 mL IJ SOAJ injection Inject 0.3 mLs (0.3 mg total) into the muscle once as needed for up to 1 dose for anaphylaxis. (Patient not taking: Reported on 07/24/2020)   losartan (COZAAR) 100 MG tablet Take 1 tablet (100 mg total) by mouth daily.   montelukast (SINGULAIR) 10 MG tablet TAKE 1 TABLET BY MOUTH AT BEDTIME   Multiple Vitamins-Minerals (ONE-A-DAY MENS 50+ PO) Take by mouth.   Multiple Vitamins-Minerals (PRESERVISION AREDS 2+MULTI VIT PO) Take 1 capsule by mouth in the morning and at bedtime.   omeprazole (PRILOSEC) 20 MG capsule Take 1 capsule (20 mg total) by mouth daily as needed (reflux).   Propylene Glycol 0.6  % SOLN Apply to eye. BID   rosuvastatin (CRESTOR) 5 MG tablet Take 1 tablet (5 mg total) by mouth every other day.   Turmeric (QC TUMERIC COMPLEX PO) Take 1,000 mg by mouth in the morning and at bedtime.   No facility-administered encounter medications on file as of 10/16/2020.    [place] Clinical Pharmacist Assistant [phone]

## 2020-10-19 ENCOUNTER — Ambulatory Visit (INDEPENDENT_AMBULATORY_CARE_PROVIDER_SITE_OTHER): Payer: PPO | Admitting: Pharmacist

## 2020-10-19 DIAGNOSIS — E782 Mixed hyperlipidemia: Secondary | ICD-10-CM

## 2020-10-19 DIAGNOSIS — I10 Essential (primary) hypertension: Secondary | ICD-10-CM

## 2020-10-19 NOTE — Progress Notes (Signed)
Chronic Care Management Pharmacy Note  10/19/2020 Name:  Marcus Gomez [facility]. MRN:  [id number] DOB:  10-Apr-1945  Summary: BP not ideally at goal < 140/90 per recent home readings LDL at goal < 100   Recommendations/Changes made from today's visit: -Recommended comparing BP cuff to pharmacy to ensure accuracy -Recommended avoidance of Advil as this can elevate BP and use of Tylenol as a replacement   Plan: Follow up BP assessment in 3-4 weeks  Subjective: Marcus Gomez. is an 75 y.o. year old male who is a primary patient of [name], NP.  The CCM team was consulted for assistance with disease management and care coordination needs.    Engaged with patient by telephone for follow up visit in response to provider referral for pharmacy case management and/or care coordination services.   Consent to Services:  The patient was given information about Chronic Care Management services, agreed to services, and gave verbal consent prior to initiation of services.  Please see initial visit note for detailed documentation.   Patient Care Team: [name], NP as PCP - General (Family Medicine) [name], [facility] as Pharmacist (Pharmacist) Marisa Sprinkles (Orthopedic Surgery)  Recent office visits: 08/03/2020 [name], NP (PCP) - Patient was seen for Routine general medical examination an other issues. Zanaflex 44m Stopped/ Prednisone 285mStopped. Cyclobenzaprine 1062mhree times a day PRN added.  04/23/20 CorDorothyann PengP: Patient presented for HTN follow up. Continued losartan 100 mg daily.  02/13/31 ShaOfilia NeasPN: Patient presented for AWV.   Recent consult visits: 09/29/20 Allergy: Patient presented for allergy injections.   08/14/20 JenAlroy BailiffT): Patient presented for PT session. Unable to access notes.  07/06/20 GarSheran Luzrtho) - Patient was seen for Primary osteoarthritis of left hand and other issues follow -up. No  medication changes noted.   06/15/20 KimGarnet SierrasO (allergy assoc) - Patient was seen for Perennial and seasonal allergic rhinitis and other issues. Omeprazole 83m32manged from 40mg12m. Budosenide 0.5mg/266mstopped, Fluticasone 50mg s25med, Ipatropium Bromide 0.03% stopped.   05/25/2020 Yoon M.Garnet Sierrasgy/asthma) - Notes unavailable  04/09/20 Anne AmGareth Morganallergy): Patient presented for allergic rhinitis follow up. Prescribed Pulmicort and doxycycline and prednisone for sinusitis.  02/12/20 GregoryMarcene Duosrtho): Patient presented for shoulder pain.    Hospital visits: Medication Reconciliation was completed by comparing discharge summary, patient's EMR and Pharmacy list, and upon discussion with patient.   [facility]  due to Torticollis. Discharge date was 07/13/2020. Patient spent 38 min at Urgent Care.   [facility]?? -started Tizanidine 4mg  ev55m 8 hours PRN    Medication Changes at Hospital Discharge: -Changed None   Medications Discontinued at Hospital Discharge: -Stopped None   Medications that remain the same after Hospital Discharge:??  -All other medications will remain the same.    Objective:  Lab Results  Component Value Date   CREATININE 0.97 10/29/2020   BUN 18 10/29/2020   GFR 76.29 10/29/2020   GFRNONAA >60 10/22/2020   GFRAA 85 (L) 12/09/2012   NA 133 (L) 10/29/2020   K 4.1 10/29/2020   CALCIUM 9.3 10/29/2020   CO2 23 10/29/2020   GLUCOSE 94 10/29/2020    Lab Results  Component Value Date/Time   HGBA1C 5.7 03/16/2016 09:14 AM   GFR 76.29 10/29/2020 08:57 AM   GFR 82.51 07/24/2020 09:06 AM    Last diabetic Eye exam: No results found  for: HMDIABEYEEXA  Last diabetic Foot exam: No results found for: HMDIABFOOTEX   Lab Results  Component Value Date   CHOL 148 07/24/2020   HDL 51.50 07/24/2020   LDLCALC 77 07/24/2020   TRIG 93.0 07/24/2020   CHOLHDL 3 07/24/2020     Hepatic Function Latest Ref Rng & Units 07/24/2020 07/24/2019 01/25/2018  Total Protein 6.0 - 8.3 g/dL 6.8 6.5 6.8  Albumin 3.5 - 5.2 g/dL 4.3 - 4.2  AST 0 - 37 U/L 15 19 22   ALT 0 - 53 U/L 21 19 26   Alk Phosphatase 39 - 117 U/L 77 - 65  Total Bilirubin 0.2 - 1.2 mg/dL 1.9(H) 1.2 1.0  Bilirubin, Direct 0.0 - 0.3 mg/dL - - 0.2    Lab Results  Component Value Date/Time   TSH 1.31 07/24/2020 09:06 AM   TSH 1.91 07/24/2019 08:23 AM    CBC Latest Ref Rng & Units 10/22/2020 07/24/2020 07/24/2019  WBC 4.0 - 10.5 K/uL 7.4 8.8 6.5  Hemoglobin 13.0 - 17.0 g/dL 14.2 14.0 14.1  Hematocrit 39.0 - 52.0 % 43.1 41.0 43.1  Platelets 150 - 400 K/uL 207 195.0 205    No results found for: VD25OH  Clinical ASCVD: No  The 10-year ASCVD risk score (Arnett DK, et al., 2019) is: 23.9%   Values used to calculate the score:     Age: 7 years     Sex: Male     Is Non-Hispanic African American: No     Diabetic: No     Tobacco smoker: No     Systolic Blood Pressure: 741 mmHg     Is BP treated: Yes     HDL Cholesterol: 51.5 mg/dL     Total Cholesterol: 148 mg/dL    Depression screen [facility] 2/9 02/13/2020 01/25/2018 10/24/2017  Decreased Interest 0 0 0  Down, Depressed, Hopeless 0 0 0  PHQ - 2 Score 0 0 0      Social History   Tobacco Use  Smoking Status Never  Smokeless Tobacco Never   BP Readings from Last 3 Encounters:  10/29/20 122/62  10/23/20 (!) 214/95  10/22/20 (!) 184/76   Pulse Readings from Last 3 Encounters:  10/29/20 74  10/23/20 80  10/22/20 (!) 57   Wt Readings from Last 3 Encounters:  10/29/20 236 lb (107 kg)  10/22/20 240 lb (108.9 kg)  07/24/20 236 lb (107 kg)   BMI Readings from Last 3 Encounters:  10/29/20 32.92 kg/m  10/22/20 33.47 kg/m  07/24/20 33.15 kg/m    Assessment/Interventions: Review of patient past medical history, allergies, medications, health status, including review of consultants reports, laboratory and other test data, was performed as part  of comprehensive evaluation and provision of chronic care management services.   SDOH:  (Social Determinants of Health) assessments and interventions performed: No  SDOH Screenings   Alcohol Screen: Low Risk    Last Alcohol Screening Score (AUDIT): 0  Depression (PHQ2-9): Low Risk    PHQ-2 Score: 0  Financial Resource Strain: Low Risk    Difficulty of Paying Living Expenses: Not hard at all  Food Insecurity: No Food Insecurity   Worried About Charity fundraiser in the Last Year: Never true   Ran Out of Food in the Last Year: Never true  Housing: Low Risk    Last Housing Risk Score: 0  Physical Activity: Sufficiently Active   Days of Exercise per Week: 7 days   Minutes of Exercise per Session: 30 min  Social Connections:  Moderately Integrated   Frequency of Communication with Friends and Family: Never   Frequency of Social Gatherings with Friends and Family: More than three times a week   Attends Religious Services: 1 to 4 times per year   Active Member of [employer] or Organizations: Yes   Attends Music therapist: More than 4 times per year   Marital Status: Divorced  Stress: No Stress Concern Present   Feeling of Stress : Not at all  Tobacco Use: Low Risk    Smoking Tobacco Use: Never   Smokeless Tobacco Use: Never   Passive Exposure: Not on file  Transportation Needs: No Transportation Needs   Lack of Transportation (Medical): No   Lack of Transportation (Non-Medical): No    CCM Care Plan  Allergies  Allergen Reactions   Lisinopril Cough    Medications Reviewed Today     Reviewed by [name], NP (Nurse Practitioner) on 10/29/20 at [phone]  Med List Status: <None>   Medication Order Taking? Sig Documenting Provider Last Dose Status Informant  cyclobenzaprine (FLEXERIL) 10 MG tablet [id number] Yes Take 1 tablet (10 mg total) by mouth 3 (three) times daily as needed for muscle spasms. [name], NP Taking Active Self  EPINEPHrine 0.3 mg/0.3 mL IJ  SOAJ injection [id number] Yes Inject 0.3 mLs (0.3 mg total) into the muscle once as needed for up to 1 dose for anaphylaxis. Bobbitt, Sedalia Muta, MD Taking Active Self  hydrochlorothiazide (HYDRODIURIL) 25 MG tablet [id number]  Take 1 tablet (25 mg total) by mouth daily. Nafziger, [name], NP  Active   losartan (COZAAR) 100 MG tablet [id number]  Take 1 tablet (100 mg total) by mouth daily. Nafziger, [name], NP  Active   montelukast (SINGULAIR) 10 MG tablet [id number] Yes TAKE 1 TABLET BY MOUTH AT BEDTIME  Patient taking differently: Take 10 mg by mouth at bedtime.   Garnet Sierras, DO Taking Active Self  Multiple Vitamins-Minerals (PRESERVISION AREDS 2+MULTI VIT PO) [id number] Yes Take 1 capsule by mouth in the morning and at bedtime. [provider] Taking Active Self  omeprazole (PRILOSEC) 20 MG capsule [id number] Yes Take 1 capsule (20 mg total) by mouth daily as needed (reflux). Garnet Sierras, DO Taking Active Self  Propylene Glycol 0.6 % SOLN [id number] Yes Place 1 drop into both eyes 2 (two) times daily. BID [provider] Taking Active Self  rosuvastatin (CRESTOR) 5 MG tablet [id number] Yes Take 1 tablet (5 mg total) by mouth every other day. [name], NP Taking Active Self  Turmeric (QC TUMERIC COMPLEX PO) [id number] Yes Take 1,000 mg by mouth in the morning and at bedtime. [provider] Taking Active Self            Patient Active Problem List   Diagnosis Date Noted   GERD (gastroesophageal reflux disease) 07/24/2017   Perennial and seasonal allergic rhinitis 03/13/2017   Coughing 03/13/2017   Lung field abnormal finding on examination 08/31/2015   Essential hypertension 08/31/2015    Immunization History  Administered Date(s) Administered   Fluad Quad(high Dose 65+) 02/13/2020   Influenza, High Dose Seasonal PF 11/03/2017   Moderna SARS-COV2 Booster Vaccination 12/21/2019   Moderna Sars-Covid-2 Vaccination 02/22/2019, 03/22/2019   Pneumococcal  Conjugate-13 03/16/2016   Pneumococcal Polysaccharide-23 01/25/2018   Patient reports his biggest concern with his health right now is his blood pressure running higher. He has been checking it consistently at home.   Patient stopped taking the Celebrex because he was concerned with it raising his  blood pressure. He has since switched to taking Advil 3-4 days a week. Discussed how this can also impact his blood pressure and recommended use of Tylenol. Patient also stopped drinking beer, which he was doing up to 8 in a day.  Conditions to be addressed/monitored:  Hypertension, Hyperlipidemia, GERD, Osteoarthritis and Allergic Rhinitis  Conditions addressed this visit: Hypertension, hyperlipidemia  Care Plan : CCM Pharmacy Care Plan  Updates made by [name], Winifred since 11/08/2020 12:00 AM     Problem: Problem: Hypertension, Hyperlipidemia, GERD, Osteoarthritis and Allergic Rhinitis      Long-Range Goal: Patient-Specific Goal   Start Date: 05/13/2020  Expected End Date: 05/13/2021  Recent Progress: On track  Priority: High  Note:   Current Barriers:  Unable to independently monitor therapeutic efficacy Unable to achieve control of cholesterol  Unable to maintain control of blood pressure  Pharmacist Clinical Goal(s):  Patient will achieve control of cholesterol as evidenced by next lipid panel  through collaboration with PharmD and provider.   Interventions: 1:1 collaboration with [name], NP regarding development and update of comprehensive plan of care as evidenced by provider attestation and co-signature Inter-disciplinary care team collaboration (see longitudinal plan of care) Comprehensive medication review performed; medication list updated in electronic medical record  Hypertension (BP goal <140/90) -Not ideally controlled -Current treatment: Losartan 100 mg 1 tablet daily - in AM -Medications previously tried: lisinopril (cough) -Current home readings:  154/63 (highest), 143/74 (lowest) pulse 64, 141/73, 153/77 (3 times a week); using an arm cuff and it is newer -Current dietary habits: doesn't add salt to food; using a lower sodium option; eating out frequently; not eating out as much -Current exercise habits: walking less but is using a stationary bike 3 to 5 days a week (5 minutes 3 times a day) -Denies hypotensive/hypertensive symptoms -Educated on Exercise goal of 150 minutes per week; Importance of home blood pressure monitoring; Proper BP monitoring technique; -Counseled to monitor BP at home weekly, document, and provide log at future appointments -Counseled on diet and exercise extensively Recommended to continue current medication  Hyperlipidemia: (LDL goal < 100) -Controlled -Current treatment: Rosuvastatin 5 mg every other day -Medications previously tried: simvastatin (never started)  -Current dietary patterns: eating out frequently; cut back on beer -Current exercise habits: walking less but is using a stationary bike 3 to 5 days a week (5 minutes 3 times a day) -Educated on Cholesterol goals;  Importance of limiting foods high in cholesterol; Exercise goal of 150 minutes per week; -Counseled on diet and exercise extensively Recommended to continue current medication  GERD (Goal: minimize symptoms) -Not ideally controlled -Current treatment  Omeprazole 20 mg 1 capsule daily as needed -Medications previously tried: none  - Patient has not noted a change in cough since starting  Allergic rhinitis (Goal: minimize symptoms) -Controlled -Current treatment  Flonase 50 mcg/act nasal spray 1 spray in the morning and at bedtime as needed  Budesonide 0.5 mg/29m rinse twice daily Ipratropium 0.03% nasal spray 1 spray in each nostril in the morning and bedtime (not using lately) Montelukast 10 mg 1 tablet daily at bedtime Allegra 160 mg 1 tablet as needed (taking it every day for 5-6 days) -Medications previously tried: none   -Recommended to continue current medication  Osteoarthritis (Goal: minimize pain) -Controlled -Current treatment  Advil 200 mg 1 tablet 3-4 times a week Turmeric 1000 mg 1 tablet daily -Medications previously tried: Celebrex -Patient played football for several years and thinks this is from this; discussed  preference of Tylenol over NSAIDs and recommended trial of diclofenac gel Patient is only having pain with his foot and bone spurs right now.  Health Maintenance -Vaccine gaps: shingles, tetanus -Current therapy:  Pataday 0.2% 1 drop in both eyes Multivitamin 1 tablet daily Preservision Areds 2 1 tablet twice daily SootheXP eye drops 1-2 times a day Eye gel daily -Educated on Cost vs benefit of each product must be carefully weighed by individual consumer -Patient is satisfied with current therapy and denies issues -Recommended to continue current medication  Patient Goals/Self-Care Activities Patient will:  - take medications as prescribed check blood pressure weekly, document, and provide at future appointments target a minimum of 150 minutes of moderate intensity exercise weekly  Follow Up Plan: Telephone follow up appointment with care management team member scheduled for: 6 months       Medication Assistance: None required.  Patient affirms current coverage meets needs.  Compliance/Adherence/Medication fill history: Care Gaps: COVID booster, tetanus, influenza, shingrix Last BP 108/64 (07/2020)    Star-Rating Drugs: Rosuvastatin 41m - Last filled 05-22-2020 90DS at [facility] Losartan 1054m- [facility]  [id number]: [phone]: [phone] pill box? No - sitting in the cabinet - all in different cabinets for different times of day Pt endorses 100% compliance  We discussed: Current pharmacy  is preferred with insurance plan and patient is satisfied with pharmacy services Patient decided to: Continue current medication management strategy  Care Plan and Follow Up Patient Decision:  Patient agrees to Care Plan and Follow-up.  Plan: The care management team will reach out to the patient again over the next 30 days.  MaJeni SallesPharmD [facility] Branchlinical Pharmacist [place] [facility]

## 2020-10-21 ENCOUNTER — Ambulatory Visit (INDEPENDENT_AMBULATORY_CARE_PROVIDER_SITE_OTHER): Payer: PPO

## 2020-10-21 DIAGNOSIS — J309 Allergic rhinitis, unspecified: Secondary | ICD-10-CM | POA: Diagnosis not present

## 2020-10-22 ENCOUNTER — Telehealth: Payer: Self-pay

## 2020-10-22 ENCOUNTER — Encounter (HOSPITAL_COMMUNITY): Payer: Self-pay | Admitting: Emergency Medicine

## 2020-10-22 ENCOUNTER — Ambulatory Visit
Admission: EM | Admit: 2020-10-22 | Discharge: 2020-10-22 | Disposition: A | Discharge status: Home / Self Care | Payer: PPO | Attending: Internal Medicine | Admitting: Internal Medicine

## 2020-10-22 ENCOUNTER — Emergency Department (HOSPITAL_COMMUNITY): Payer: PPO

## 2020-10-22 ENCOUNTER — Other Ambulatory Visit: Payer: Self-pay

## 2020-10-22 ENCOUNTER — Emergency Department (HOSPITAL_COMMUNITY)
Admission: EM | Admit: 2020-10-22 | Discharge: 2020-10-23 | Disposition: A | Discharge status: Home / Self Care | Payer: PPO | Attending: Emergency Medicine | Admitting: Emergency Medicine

## 2020-10-22 ENCOUNTER — Encounter: Payer: Self-pay | Admitting: Emergency Medicine

## 2020-10-22 DIAGNOSIS — I160 Hypertensive urgency: Secondary | ICD-10-CM | POA: Diagnosis not present

## 2020-10-22 DIAGNOSIS — I10 Essential (primary) hypertension: Secondary | ICD-10-CM | POA: Diagnosis not present

## 2020-10-22 DIAGNOSIS — R42 Dizziness and giddiness: Secondary | ICD-10-CM

## 2020-10-22 DIAGNOSIS — Z8546 Personal history of malignant neoplasm of prostate: Secondary | ICD-10-CM | POA: Diagnosis not present

## 2020-10-22 DIAGNOSIS — Z79899 Other long term (current) drug therapy: Secondary | ICD-10-CM | POA: Diagnosis not present

## 2020-10-22 DIAGNOSIS — R55 Syncope and collapse: Secondary | ICD-10-CM | POA: Diagnosis not present

## 2020-10-22 LAB — CBC WITH DIFFERENTIAL/PLATELET
Abs Immature Granulocytes: 0.01 10*3/uL (ref 0.00–0.07)
Basophils Absolute: 0 10*3/uL (ref 0.0–0.1)
Basophils Relative: 0 %
Eosinophils Absolute: 0.1 10*3/uL (ref 0.0–0.5)
Eosinophils Relative: 2 %
HCT: 43.1 % (ref 39.0–52.0)
Hemoglobin: 14.2 g/dL (ref 13.0–17.0)
Immature Granulocytes: 0 %
Lymphocytes Relative: 30 %
Lymphs Abs: 2.2 10*3/uL (ref 0.7–4.0)
MCH: 30 pg (ref 26.0–34.0)
MCHC: 32.9 g/dL (ref 30.0–36.0)
MCV: 90.9 fL (ref 80.0–100.0)
Monocytes Absolute: 0.6 10*3/uL (ref 0.1–1.0)
Monocytes Relative: 8 %
Neutro Abs: 4.5 10*3/uL (ref 1.7–7.7)
Neutrophils Relative %: 60 %
Platelets: 207 10*3/uL (ref 150–400)
RBC: 4.74 MIL/uL (ref 4.22–5.81)
RDW: 13.2 % (ref 11.5–15.5)
WBC: 7.4 10*3/uL (ref 4.0–10.5)
nRBC: 0 % (ref 0.0–0.2)

## 2020-10-22 LAB — TROPONIN I (HIGH SENSITIVITY)
Troponin I (High Sensitivity): 8 ng/L (ref <18)
Troponin I (High Sensitivity): 10 ng/L (ref <18)

## 2020-10-22 LAB — BASIC METABOLIC PANEL
Anion gap: 9 (ref 5–15)
BUN: 12 mg/dL (ref 8–23)
CO2: 23 mmol/L (ref 22–32)
Calcium: 9.5 mg/dL (ref 8.9–10.3)
Chloride: 106 mmol/L (ref 98–111)
Creatinine, Ser: 0.77 mg/dL (ref 0.61–1.24)
GFR, Estimated: >60 mL/min (ref >60)
Glucose, Bld: 96 mg/dL (ref 70–99)
Potassium: 3.6 mmol/L (ref 3.5–5.1)
Sodium: 138 mmol/L (ref 135–145)

## 2020-10-22 LAB — CBG MONITORING, ED: Glucose-Capillary: 79 mg/dL (ref 70–99)

## 2020-10-22 NOTE — ED Provider Notes (Signed)
[facility]    CSN: [id number] Arrival date & time: 10/22/20  1518      History   Chief Complaint Chief Complaint  Patient presents with   Elevated Blood Pressure    HPI Marcus Gomez. is a 75 y.o. male.   Patient presents with "woozy feeling" that started today.  Patient reports that he went to the dentist and when he returned home he felt very lightheaded.  Denies headache, nausea, vomiting, blurred vision, chest pain, shortness of breath.  Patient reports he is concerned about high blood pressure.  Takes losartan daily.  Last seen PCP a few months prior.  Does not take blood pressure at home daily.  Patient also reports that he took his blood pressure at home when he started feeling like this and his blood pressure was 308M and 578 systolic.  Patient also reports that his blood pressure machine told him that he had an irregular heart rate.    Past Medical History:  Diagnosis Date   Essential hypertension    Prostate cancer ([place])    Remission    Patient Active Problem List   Diagnosis Date Noted   GERD (gastroesophageal reflux disease) 07/24/2017   Perennial and seasonal allergic rhinitis 03/13/2017   Coughing 03/13/2017   Lung field abnormal finding on examination 08/31/2015   Essential hypertension 08/31/2015    Past Surgical History:  Procedure Laterality Date   KNEE SURGERY Bilateral 2015 & 2014   SHOULDER SURGERY Right 2016       Home Medications    Prior to Admission medications   Medication Sig Start Date End Date Taking? Authorizing Provider  cyclobenzaprine (FLEXERIL) 10 MG tablet Take 1 tablet (10 mg total) by mouth 3 (three) times daily as needed for muscle spasms. 07/24/20  Yes Nafziger, [name], NP  losartan (COZAAR) 100 MG tablet Take 1 tablet (100 mg total) by mouth daily. 04/23/20  Yes Nafziger, [name], NP  montelukast (SINGULAIR) 10 MG tablet TAKE 1 TABLET BY MOUTH AT BEDTIME 06/02/20  Yes Garnet Sierras, DO  Multiple  Vitamins-Minerals (ONE-A-DAY MENS 50+ PO) Take by mouth.   Yes [provider]  Multiple Vitamins-Minerals (PRESERVISION AREDS 2+MULTI VIT PO) Take 1 capsule by mouth in the morning and at bedtime.   Yes [provider]  omeprazole (PRILOSEC) 20 MG capsule Take 1 capsule (20 mg total) by mouth daily as needed (reflux). 06/15/20  Yes Garnet Sierras, DO  Propylene Glycol 0.6 % SOLN Apply to eye. BID   Yes [provider]  Turmeric (QC TUMERIC COMPLEX PO) Take 1,000 mg by mouth in the morning and at bedtime.   Yes [provider]  celecoxib (CELEBREX) 200 MG capsule Take 200 mg by mouth every other day. Patient not taking: Reported on 07/24/2020    [provider]  EPINEPHrine 0.3 mg/0.3 mL IJ SOAJ injection Inject 0.3 mLs (0.3 mg total) into the muscle once as needed for up to 1 dose for anaphylaxis. Patient not taking: Reported on 07/24/2020 11/12/18   Bobbitt, Sedalia Muta, MD  rosuvastatin (CRESTOR) 5 MG tablet Take 1 tablet (5 mg total) by mouth every other day. 05/22/20 08/20/20  [name], NP    Family History Family History  Problem Relation Age of Onset   Stroke Mother    Allergies Father    COPD Father        never smoker   Rheum arthritis Father    Allergic rhinitis Neg Hx    Angioedema  Neg Hx    Asthma Neg Hx    Eczema Neg Hx    Immunodeficiency Neg Hx    Urticaria Neg Hx     Social History Social History   Tobacco Use   Smoking status: Never   Smokeless tobacco: Never  Vaping Use   Vaping Use: Never used  Substance Use Topics   Alcohol use: Yes    Comment: socially    Drug use: No     Allergies   Lisinopril   Review of Systems Review of Systems Per HPI  Physical Exam Triage Vital Signs ED Triage Vitals  Enc Vitals Group     BP 10/22/20 1635 (!) 184/76     Pulse Rate 10/22/20 1635 (!) 57     Resp 10/22/20 1635 18     Temp 10/22/20 1635 98.1 F (36.7 C)     Temp Source 10/22/20 1635 Oral     SpO2 10/22/20  1635 96 %     Weight 10/22/20 1636 240 lb (108.9 kg)     Height 10/22/20 1636 5\' 11"  (1.803 m)     Head Circumference --      Peak Flow --      Pain Score 10/22/20 1636 0     Pain Loc --      Pain Edu? --      Excl. in [place]? --    No data found.  Updated Vital Signs BP (!) 184/76 (BP Location: Left Arm)   Pulse (!) 57   Temp 98.1 F (36.7 C) (Oral)   Resp 18   Ht 5\' 11"  (1.803 m)   Wt 240 lb (108.9 kg)   SpO2 96%   BMI 33.47 kg/m   Visual Acuity Right Eye Distance:   Left Eye Distance:   Bilateral Distance:    Right Eye Near:   Left Eye Near:    Bilateral Near:     Physical Exam Constitutional:      General: He is not in acute distress.    Appearance: Normal appearance. He is not toxic-appearing or diaphoretic.  HENT:     Head: Normocephalic and atraumatic.  Eyes:     Extraocular Movements: Extraocular movements intact.     Conjunctiva/sclera: Conjunctivae normal.  Cardiovascular:     Rate and Rhythm: Regular rhythm. Bradycardia present.  Pulmonary:     Effort: Pulmonary effort is normal. No respiratory distress.     Breath sounds: Normal breath sounds.  Skin:    General: Skin is warm and dry.  Neurological:     General: No focal deficit present.     Mental Status: He is alert and oriented to person, place, and time. Mental status is at baseline.     Cranial Nerves: Cranial nerves are intact.     Sensory: Sensation is intact.     Motor: Motor function is intact.     Coordination: Coordination is intact.     Gait: Gait is intact.  Psychiatric:        Mood and Affect: Mood normal.        Behavior: Behavior normal.        Thought Content: Thought content normal.        Judgment: Judgment normal.     UC Treatments / Results  Labs (all labs ordered are listed, but only abnormal results are displayed) Labs Reviewed - No data to display  EKG   Radiology No results found.  Procedures Procedures (including critical care time)  Medications Ordered in  UC Medications - No data to display  Initial Impression / Assessment and Plan / UC Course  I have reviewed the triage vital signs and the nursing notes.  Pertinent labs & imaging results that were available during my care of the patient were reviewed by me and considered in my medical decision making (see chart for details).     Patient exhibiting signs of hypertensive urgency with significantly elevated blood pressure and dizziness.  EKG completed showing normal sinus rhythm.  Patient was advised to go to the hospital for further evaluation and management.  Patient was agreeable with plan.  Vital signs fairly stable at discharge.  Agree with patient self transport to the hospital. Final Clinical Impressions(s) / UC Diagnoses   Final diagnoses:  Hypertensive urgency  Dizziness and giddiness     Discharge Instructions      Please go to the emergency department as soon as you leave urgent care for further evaluation and management.     ED Prescriptions   None    PDMP not reviewed this encounter.   Odis Luster, FNP 10/22/20 [phone]

## 2020-10-22 NOTE — ED Triage Notes (Signed)
Patient states that he noticed he was feeling woozy, checked BP 202/104.  Patient tried calling his PCP, no return call.

## 2020-10-22 NOTE — Telephone Encounter (Signed)
Patient called to inform PCP that B/P readings today are 192/97 and 202/101 and would like call back asap.

## 2020-10-22 NOTE — ED Triage Notes (Signed)
Patient here from home reporting dizziness and hypertension today. States that he was driving home after dentist and had a sudden onset of dizziness while driving. Took BP at home and it was remarkably high.

## 2020-10-22 NOTE — Discharge Instructions (Addendum)
Please go to the emergency department as soon as you leave urgent care for further evaluation and management. ?

## 2020-10-22 NOTE — ED Provider Notes (Signed)
Emergency Medicine Provider Triage Evaluation Note  Marcus Gomez Marcus Gomez. , a 75 y.o. male  was evaluated in triage.  Pt complains of who presents with hypertension and near syncope.  Patient went to the dentist earlier today and had multiple numbing injections in his mouth.  On his way home he began feeling lightheaded.  When he got out of the car he felt like he was going to pass out.  When he got in the house he took his blood pressure and it was markedly elevated.  He went to the urgent care on [facility] and was sent here for further evaluation.  He denies chest pain, shortness of breath..  Review of Systems  Positive: Near syncope Negative: Chest pain  Physical Exam  Pulse 69   Temp 98.3 F (36.8 C) (Oral)   Resp 18   SpO2 100%  Gen:   Awake, no distress   Resp:  Normal effort  MSK:   Moves extremities without difficulty  Other:  RRR  Medical Decision Making  Medically screening exam initiated at 5:50 PM.  Appropriate orders placed.  Marcus Gomez. was informed that the remainder of the evaluation will be completed by another provider, this initial triage assessment does not replace that evaluation, and the importance of remaining in the ED until their evaluation is complete.  Patient here with hypertension, near syncope, work-up initiated.   Margarita Mail, PA-C 10/22/20 1753    Marcus Freeze, MD 10/22/20 [phone]

## 2020-10-23 ENCOUNTER — Encounter (HOSPITAL_COMMUNITY): Payer: Self-pay | Admitting: Emergency Medicine

## 2020-10-23 ENCOUNTER — Emergency Department (HOSPITAL_COMMUNITY): Payer: PPO

## 2020-10-23 DIAGNOSIS — R42 Dizziness and giddiness: Secondary | ICD-10-CM | POA: Diagnosis not present

## 2020-10-23 MED ORDER — HYDROCHLOROTHIAZIDE 25 MG PO TABS: 25.0000 mg | ORAL_TABLET | Freq: Every day | ORAL | 0 refills | Status: DC

## 2020-10-23 MED ORDER — HYDROCHLOROTHIAZIDE 12.5 MG PO CAPS
12.5000 mg | ORAL_CAPSULE | Freq: Once | ORAL | Status: AC
  Administered 2020-10-23: 12.5 mg via ORAL
  Filled 2020-10-23: qty 1

## 2020-10-23 NOTE — ED Provider Notes (Signed)
[facility] DEPT Provider Note   CSN: [id number] Arrival date & time: 10/22/20  1732     History Chief Complaint  Patient presents with   Dizziness   Hypertension    Marcus Gomez. is a 75 y.o. male.  The history is provided by the patient.  Dizziness Quality:  Lightheadedness Severity:  Mild Onset quality:  Gradual Duration: hours. Timing:  Rare Progression:  Resolved Chronicity:  New Context: not when bending over and not with head movement   Context comment:  After numbing injections in the mouth at dentist and also noted bp was high Relieved by:  Nothing Worsened by:  Nothing Ineffective treatments:  None tried Associated symptoms: no blood in stool, no chest pain, no diarrhea, no headaches, no hearing loss, no nausea, no palpitations, no shortness of breath, no syncope, no tinnitus, no vision changes, no vomiting and no weakness   Risk factors: no hx of vertigo and no Meniere's disease   Hypertension Pertinent negatives include no chest pain, no headaches and no shortness of breath.      Past Medical History:  Diagnosis Date   Essential hypertension    Prostate cancer ([place])    Remission    Patient Active Problem List   Diagnosis Date Noted   GERD (gastroesophageal reflux disease) 07/24/2017   Perennial and seasonal allergic rhinitis 03/13/2017   Coughing 03/13/2017   Lung field abnormal finding on examination 08/31/2015   Essential hypertension 08/31/2015    Past Surgical History:  Procedure Laterality Date   KNEE SURGERY Bilateral 2015 & 2014   SHOULDER SURGERY Right 2016       Family History  Problem Relation Age of Onset   Stroke Mother    Allergies Father    COPD Father        never smoker   Rheum arthritis Father    Allergic rhinitis Neg Hx    Angioedema Neg Hx    Asthma Neg Hx    Eczema Neg Hx    Immunodeficiency Neg Hx    Urticaria Neg Hx     Social History   Tobacco Use   Smoking  status: Never   Smokeless tobacco: Never  Vaping Use   Vaping Use: Never used  Substance Use Topics   Alcohol use: Yes    Comment: socially    Drug use: No    Home Medications Prior to Admission medications   Medication Sig Start Date End Date Taking? Authorizing Provider  cyclobenzaprine (FLEXERIL) 10 MG tablet Take 1 tablet (10 mg total) by mouth 3 (three) times daily as needed for muscle spasms. 07/24/20   Nafziger, [name], NP  EPINEPHrine 0.3 mg/0.3 mL IJ SOAJ injection Inject 0.3 mLs (0.3 mg total) into the muscle once as needed for up to 1 dose for anaphylaxis. Patient not taking: Reported on 07/24/2020 11/12/18   Bobbitt, Sedalia Muta, MD  losartan (COZAAR) 100 MG tablet Take 1 tablet (100 mg total) by mouth daily. 04/23/20   Nafziger, [name], NP  montelukast (SINGULAIR) 10 MG tablet TAKE 1 TABLET BY MOUTH AT BEDTIME 06/02/20   Garnet Sierras, DO  Multiple Vitamins-Minerals (ONE-A-DAY MENS 50+ PO) Take by mouth.    [provider]  Multiple Vitamins-Minerals (PRESERVISION AREDS 2+MULTI VIT PO) Take 1 capsule by mouth in the morning and at bedtime.    [provider]  omeprazole (PRILOSEC) 20 MG capsule Take 1 capsule (20 mg total) by mouth daily as needed (reflux). 06/15/20   Maudie Mercury,  Albin Felling, DO  Propylene Glycol 0.6 % SOLN Apply to eye. BID    [provider]  rosuvastatin (CRESTOR) 5 MG tablet Take 1 tablet (5 mg total) by mouth every other day. 05/22/20 08/20/20  Nafziger, [name], NP  Turmeric (QC TUMERIC COMPLEX PO) Take 1,000 mg by mouth in the morning and at bedtime.    [provider]    Allergies    Lisinopril  Review of Systems   Review of Systems  Constitutional:  Negative for diaphoresis and fever.  HENT:  Negative for hearing loss and tinnitus.   Eyes:  Negative for redness.  Respiratory:  Negative for shortness of breath.   Cardiovascular:  Negative for chest pain, palpitations and syncope.  Gastrointestinal:  Negative for blood in stool,  diarrhea, nausea and vomiting.  Genitourinary:  Negative for difficulty urinating.  Musculoskeletal:  Negative for neck stiffness.  Skin:  Negative for rash.  Neurological:  Positive for light-headedness. Negative for dizziness, facial asymmetry, speech difficulty, weakness and headaches.  Psychiatric/Behavioral:  Negative for agitation.    Physical Exam Updated Vital Signs BP (!) 178/79   Pulse 63   Temp 98.3 F (36.8 C) (Oral)   Resp 18   SpO2 98%   Physical Exam Vitals and nursing note reviewed.  Constitutional:      General: He is not in acute distress.    Appearance: Normal appearance.  HENT:     Head: Normocephalic and atraumatic.     Nose: Nose normal.     Mouth/Throat:     Mouth: Mucous membranes are moist.     Pharynx: Oropharynx is clear.  Eyes:     Extraocular Movements: Extraocular movements intact.     Conjunctiva/sclera: Conjunctivae normal.     Pupils: Pupils are equal, round, and reactive to light.  Cardiovascular:     Rate and Rhythm: Normal rate and regular rhythm.     Pulses: Normal pulses.     Heart sounds: Normal heart sounds.  Pulmonary:     Effort: Pulmonary effort is normal.     Breath sounds: Normal breath sounds.  Abdominal:     General: Abdomen is flat. Bowel sounds are normal.     Palpations: Abdomen is soft.     Tenderness: There is no abdominal tenderness. There is no guarding.  Musculoskeletal:        General: Normal range of motion.     Cervical back: Normal range of motion and neck supple.  Skin:    General: Skin is warm and dry.     Capillary Refill: Capillary refill takes less than 2 seconds.  Neurological:     General: No focal deficit present.     Mental Status: He is alert and oriented to person, place, and time.     Cranial Nerves: No cranial nerve deficit.     Deep Tendon Reflexes: Reflexes normal.  Psychiatric:        Mood and Affect: Mood normal.        Behavior: Behavior normal.    ED Results / Procedures /  Treatments   Labs (all labs ordered are listed, but only abnormal results are displayed) Results for orders placed or performed during the hospital encounter of 22/29/79  Basic metabolic panel  Result Value Ref Range   Sodium 138 135 - 145 mmol/L   Potassium 3.6 3.5 - 5.1 mmol/L   Chloride 106 98 - 111 mmol/L   CO2 23 22 - 32 mmol/L   Glucose, Bld 96 70 -  99 mg/dL   BUN 12 8 - 23 mg/dL   Creatinine, Ser 0.77 0.61 - 1.24 mg/dL   Calcium 9.5 8.9 - 10.3 mg/dL   GFR, Estimated >60 >60 mL/min   Anion gap 9 5 - 15  CBC WITH DIFFERENTIAL  Result Value Ref Range   WBC 7.4 4.0 - 10.5 K/uL   RBC 4.74 4.22 - 5.81 MIL/uL   Hemoglobin 14.2 13.0 - 17.0 g/dL   HCT 43.1 39.0 - 52.0 %   MCV 90.9 80.0 - 100.0 fL   MCH 30.0 26.0 - 34.0 pg   MCHC 32.9 30.0 - 36.0 g/dL   RDW 13.2 11.5 - 15.5 %   Platelets 207 150 - 400 K/uL   nRBC 0.0 0.0 - 0.2 %   Neutrophils Relative % 60 %   Neutro Abs 4.5 1.7 - 7.7 K/uL   Lymphocytes Relative 30 %   Lymphs Abs 2.2 0.7 - 4.0 K/uL   Monocytes Relative 8 %   Monocytes Absolute 0.6 0.1 - 1.0 K/uL   Eosinophils Relative 2 %   Eosinophils Absolute 0.1 0.0 - 0.5 K/uL   Basophils Relative 0 %   Basophils Absolute 0.0 0.0 - 0.1 K/uL   Immature Granulocytes 0 %   Abs Immature Granulocytes 0.01 0.00 - 0.07 K/uL  CBG monitoring, ED  Result Value Ref Range   Glucose-Capillary 79 70 - 99 mg/dL  Troponin I (High Sensitivity)  Result Value Ref Range   Troponin I (High Sensitivity) 8 <18 ng/L  Troponin I (High Sensitivity)  Result Value Ref Range   Troponin I (High Sensitivity) 10 <18 ng/L   DG Chest 2 View  Result Date: 10/22/2020 CLINICAL DATA:  Near syncope EXAM: CHEST - 2 VIEW COMPARISON:  10/22/2018 chest radiograph. FINDINGS: Stable cardiomediastinal silhouette with normal heart size. No pneumothorax. No pleural effusion. Lungs appear clear, with no acute consolidative airspace disease and no pulmonary edema. IMPRESSION: No active cardiopulmonary disease.  Electronically Signed   By: Ilona Sorrel M.D.   On: 10/22/2020 18:42    None  Radiology DG Chest 2 View  Result Date: 10/22/2020 CLINICAL DATA:  Near syncope EXAM: CHEST - 2 VIEW COMPARISON:  10/22/2018 chest radiograph. FINDINGS: Stable cardiomediastinal silhouette with normal heart size. No pneumothorax. No pleural effusion. Lungs appear clear, with no acute consolidative airspace disease and no pulmonary edema. IMPRESSION: No active cardiopulmonary disease. Electronically Signed   By: Ilona Sorrel M.D.   On: 10/22/2020 18:42    Procedures Procedures   EKG Interpretation  Date/Time:  Thursday October 22 2020 18:09:42 EDT Ventricular Rate:  62 PR Interval:  193 QRS Duration: 101 QT Interval:  401 QTC Calculation: 408 R Axis:   38 Text Interpretation: Sinus rhythm Confirmed by Randal Buba, Yvonnia Tango ([id number]) on 10/23/2020 12:43:32 AM        Medications Ordered in ED Medications  hydrochlorothiazide (MICROZIDE) capsule 12.5 mg (has no administration in time range)    ED Course  I have reviewed the triage vital signs and the nursing notes.  Pertinent labs & imaging results that were available during my care of the patient were reviewed by me and considered in my medical decision making (see chart for details).   Ruled out for MI based on EKG and 2 troponins, heart score is 2 low risk for MACE.  No signs of stroke on exam or imaging.     I suspect the multiple injections caused stress and medication and or stress caused lightheadedness and elevated BP.  It  has gone down without intervention.  I have advised the patient to discuss new medication with his PMD.  Call in am.  Patient verbalizes understanding and agrees to follow up  Marcus Gomez. was evaluated in Emergency Department on 10/23/2020 for the symptoms described in the history of present illness. He was evaluated in the context of the global COVID-19 pandemic, which necessitated consideration that the patient  might be at risk for infection with the SARS-CoV-2 virus that causes COVID-19. Institutional protocols and algorithms that pertain to the evaluation of patients at risk for COVID-19 are in a state of rapid change based on information released by regulatory bodies including the CDC and federal and state organizations. These policies and algorithms were followed during the patient's care in the ED.  Final Clinical Impression(s) / ED Diagnoses Final diagnoses:  None   Return for intractable cough, coughing up blood, fevers > 100.4 unrelieved by medication, shortness of breath, intractable vomiting, chest pain, shortness of breath, weakness, numbness, changes in speech, facial asymmetry, abdominal pain, passing out, Inability to tolerate liquids or food, cough, altered mental status or any concerns. No signs of systemic illness or infection. The patient is nontoxic-appearing on exam and vital signs are within normal limits.  I have reviewed the triage vital signs and the nursing notes. Pertinent labs & imaging results that were available during my care of the patient were reviewed by me and considered in my medical decision making (see chart for details). After history, exam, and medical workup I feel the patient has been appropriately medically screened and is safe for discharge home. Pertinent diagnoses were discussed with the patient. Patient was given return precautions.    Rx / DC Orders ED Discharge Orders     None        Prentiss Polio, MD 10/23/20 [phone]

## 2020-10-23 NOTE — Telephone Encounter (Signed)
Spoke to pt and he stated that per ED doctors pt needed  plan for his elevated BP. Pt was prescribed HCTZ and was told to f/u with PCP. Spoke to [place] verbally and he stated that lidocaine pt received from the dentist could have caused increase BP. Pt advised to f/u next week per [facility] and continue taking HCTZ. Pt verbalized understanding.

## 2020-10-23 NOTE — Telephone Encounter (Signed)
Pt wants to know if he can take the losartan at the same time as the HCTZ.

## 2020-10-23 NOTE — Telephone Encounter (Signed)
Patient notified of update  and verbalized understanding. 

## 2020-10-29 ENCOUNTER — Ambulatory Visit (INDEPENDENT_AMBULATORY_CARE_PROVIDER_SITE_OTHER): Payer: PPO | Admitting: Adult Health

## 2020-10-29 ENCOUNTER — Encounter: Payer: Self-pay | Admitting: Adult Health

## 2020-10-29 ENCOUNTER — Other Ambulatory Visit: Payer: Self-pay

## 2020-10-29 VITALS — BP 122/62 | HR 74 | Temp 98.6°F | Ht 71.0 in | Wt 236.0 lb

## 2020-10-29 DIAGNOSIS — I10 Essential (primary) hypertension: Secondary | ICD-10-CM | POA: Diagnosis not present

## 2020-10-29 LAB — BASIC METABOLIC PANEL
BUN: 18 mg/dL (ref 6–23)
CO2: 23 mEq/L (ref 19–32)
Calcium: 9.3 mg/dL (ref 8.4–10.5)
Chloride: 102 mEq/L (ref 96–112)
Creatinine, Ser: 0.97 mg/dL (ref 0.40–1.50)
GFR: 76.29 mL/min (ref >60.00)
Glucose, Bld: 94 mg/dL (ref 70–99)
Potassium: 4.1 mEq/L (ref 3.5–5.1)
Sodium: 133 mEq/L — ABNORMAL LOW (ref 135–145)

## 2020-10-29 MED ORDER — LOSARTAN POTASSIUM 100 MG PO TABS: 100.0000 mg | ORAL_TABLET | Freq: Every day | ORAL | 1 refills | Status: DC

## 2020-10-29 MED ORDER — HYDROCHLOROTHIAZIDE 25 MG PO TABS: 25.0000 mg | ORAL_TABLET | Freq: Every day | ORAL | 1 refills | Status: DC

## 2020-10-29 NOTE — Progress Notes (Signed)
Subjective:    Patient ID: Marcus Gomez., male    DOB: 08/23/1945, 76 y.o.   MRN: [id number]  HPI 75 year old male who is being evaluated today for follow-up after recent ER visit 7 days ago.  He was seen for hypertensive urgency after visiting the dentist.  Prior to going to the dentist he was monitoring his blood pressure at home with readings in the 140s over 70s.  At the dentist he had numbing medication placed in his lip, and it was noted that when he got home he felt lightheaded and dizzy, he checked his blood pressure at the highest he got was 202/110.  He then went to the emergency room.  His EKG was normal and 2 troponins were unremarkable.  It was thought that multiple injections cause stress and or medication caused the lightheadedness and elevated blood pressure.  He was started on hydrochlorothiazide 25 mg in addition to losartan 100 mg  Today he reports that he has been taking both medications, blood pressure readings per his log in the 115 to 130s over 60s to 70s.  He does have some intermittent lightheadedness that lasts for a few seconds when he changes positions too quickly.    Review of Systems See HPI   Past Medical History:  Diagnosis Date  . Essential hypertension   . Prostate cancer ([place])    Remission    Social History   Socioeconomic History  . Marital status: Divorced    Spouse name: Not on file  . Number of children: Not on file  . Years of education: Not on file  . Highest education level: Not on file  Occupational History  . Not on file  Tobacco Use  . Smoking status: Never  . Smokeless tobacco: Never  Vaping Use  . Vaping Use: Never used  Substance and Sexual Activity  . Alcohol use: Yes    Comment: socially   . Drug use: No  . Sexual activity: Yes  Other Topics Concern  . 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       Social Determinants of  Health   Financial Resource Strain: Low Risk   . Difficulty of Paying Living Expenses: Not hard at all  Food Insecurity: No Food Insecurity  . Worried About Charity fundraiser in the Last Year: Never true  . Ran Out of Food in the Last Year: Never true  Transportation Needs: No Transportation Needs  . Lack of Transportation (Medical): No  . Lack of Transportation (Non-Medical): No  Physical Activity: Sufficiently Active  . Days of Exercise per Week: 7 days  . Minutes of Exercise per Session: 30 min  Stress: No Stress Concern Present  . Feeling of Stress : Not at all  Social Connections: Moderately Integrated  . Frequency of Communication with Friends and Family: Never  . Frequency of Social Gatherings with Friends and Family: More than three times a week  . Attends Religious Services: 1 to 4 times per year  . Active Member of Clubs or Organizations: Yes  . Attends Archivist Meetings: More than 4 times per year  . Marital Status: Divorced  Human resources officer Violence: Not At Risk  . Fear of Current or Ex-Partner: No  . Emotionally Abused: No  . Physically Abused: No  . Sexually Abused: No    Past Surgical History:  Procedure Laterality Date  . KNEE SURGERY Bilateral 2015 & 2014  . SHOULDER SURGERY Right 2016    Family History  Problem Relation Age of Onset  . Stroke Mother   . Allergies Father   . COPD Father        never smoker  . Rheum arthritis Father   . Allergic rhinitis Neg Hx   . Angioedema Neg Hx   . Asthma Neg Hx   . Eczema Neg Hx   . Immunodeficiency Neg Hx   . Urticaria Neg Hx     Allergies  Allergen Reactions  . Lisinopril Cough    Current Outpatient Medications on File Prior to Visit  Medication Sig Dispense Refill  . cyclobenzaprine (FLEXERIL) 10 MG tablet Take 1 tablet (10 mg total) by mouth 3 (three) times daily as needed for muscle spasms. 30 tablet 0  . EPINEPHrine 0.3 mg/0.3 mL IJ SOAJ injection Inject 0.3 mLs (0.3 mg total) into  the muscle once as needed for up to 1 dose for anaphylaxis. 0.3 mL 1  . montelukast (SINGULAIR) 10 MG tablet TAKE 1 TABLET BY MOUTH AT BEDTIME (Patient taking differently: Take 10 mg by mouth at bedtime.) 90 tablet 2  . Multiple Vitamins-Minerals (PRESERVISION AREDS 2+MULTI VIT PO) Take 1 capsule by mouth in the morning and at bedtime.    Marland Kitchen omeprazole (PRILOSEC) 20 MG capsule Take 1 capsule (20 mg total) by mouth daily as needed (reflux). 30 capsule 5  . Propylene Glycol 0.6 % SOLN Place 1 drop into both eyes 2 (two) times daily. BID    . rosuvastatin (CRESTOR) 5 MG tablet Take 1 tablet (5 mg total) by mouth every other day. 45 tablet 3  . Turmeric (QC TUMERIC COMPLEX PO) Take 1,000 mg by mouth in the morning and at bedtime.     No current facility-administered medications on file prior to visit.    BP 122/62   Pulse 74   Temp 98.6 F (37 C) (Oral)   Ht 5\' 11"  (1.803 m)   Wt 236 lb (107 kg)   SpO2 97%   BMI 32.92 kg/m       Objective:   Physical Exam Vitals and nursing note reviewed.  Constitutional:      Appearance: Normal appearance.  Cardiovascular:     Rate and Rhythm: Normal rate and regular rhythm.     Pulses: Normal pulses.     Heart sounds: Normal heart sounds.  Pulmonary:     Effort: Pulmonary effort is normal.     Breath sounds: Normal breath sounds.  Musculoskeletal:        General: Normal range of motion.  Skin:    General: Skin is warm and dry.  Neurological:     General: No focal deficit present.     Mental Status: He is alert and oriented to person, place, and time.  Psychiatric:        Mood and Affect: Mood normal.        Behavior: Behavior normal.        Thought Content: Thought content normal.        Judgment: Judgment normal.      Assessment & Plan:  1. Essential hypertension -Blood pressure at goal.  We will check a BMP today.  He was advised to follow-up if blood pressure drops into the 110 range.  Follow-up as needed - hydrochlorothiazide  (HYDRODIURIL) 25 MG tablet; Take 1 tablet (25 mg total) by mouth daily.  Dispense: 90 tablet; Refill: 1 -  Basic Metabolic Panel; Future - losartan (COZAAR) 100 MG tablet; Take 1 tablet (100 mg total) by mouth daily.  Dispense: 90 tablet; Refill: 1  [name], NP

## 2020-11-09 DIAGNOSIS — E782 Mixed hyperlipidemia: Secondary | ICD-10-CM

## 2020-11-09 DIAGNOSIS — I10 Essential (primary) hypertension: Secondary | ICD-10-CM | POA: Diagnosis not present

## 2020-11-10 ENCOUNTER — Ambulatory Visit (INDEPENDENT_AMBULATORY_CARE_PROVIDER_SITE_OTHER): Payer: PPO | Admitting: *Deleted

## 2020-11-10 DIAGNOSIS — J309 Allergic rhinitis, unspecified: Secondary | ICD-10-CM

## 2020-11-16 DIAGNOSIS — C61 Malignant neoplasm of prostate: Secondary | ICD-10-CM | POA: Diagnosis not present

## 2020-11-24 ENCOUNTER — Telehealth: Payer: Self-pay | Admitting: Pharmacist

## 2020-11-24 NOTE — Chronic Care Management (AMB) (Signed)
Chronic Care Management Pharmacy Assistant   Name: Santhosh Gulino.  MRN: [id number] DOB: 16-Jul-1945  Reason for Encounter: Disease State Hypertension Assessment Call    Conditions to be addressed/monitored: HTN  Recent office visits:  10/29/20 [name], NP - Patient presented for Essential Hypertension. No medication changes.  Recent consult visits:  11/10/20 Derl Barrow, CMA ([facility] - Patient presented for Allergy Injections.  10/21/20 Julius Bowels, CMA ([facility]) - Patient presented for Allergy Injections. Hospital visits:  Medication Reconciliation was completed by comparing discharge summary, patient's EMR and Pharmacy list, and upon discussion with patient.  Patient presented to [facility] ED on 10/22/20 due to Dizziness and Hypertension. Patient was present for 9 hours.  New?Medications Started at [facility] Discharge:?? -started  hydrochlorothiazide 25 MG tablet  Medication Changes at Hospital Discharge: -Changed  None  Medications Discontinued at Hospital Discharge: -Stopped  None  Medications that remain the same after Hospital Discharge:??  -All other medications will remain the same.    Medications: Outpatient Encounter Medications as of 11/24/2020  Medication Sig   cyclobenzaprine (FLEXERIL) 10 MG tablet Take 1 tablet (10 mg total) by mouth 3 (three) times daily as needed for muscle spasms.   EPINEPHrine 0.3 mg/0.3 mL IJ SOAJ injection Inject 0.3 mLs (0.3 mg total) into the muscle once as needed for up to 1 dose for anaphylaxis.   hydrochlorothiazide (HYDRODIURIL) 25 MG tablet Take 1 tablet (25 mg total) by mouth daily.   losartan (COZAAR) 100 MG tablet Take 1 tablet (100 mg total) by mouth daily.   montelukast (SINGULAIR) 10 MG tablet TAKE 1 TABLET BY MOUTH AT BEDTIME (Patient taking differently: Take 10 mg by mouth at bedtime.)   Multiple Vitamins-Minerals (PRESERVISION AREDS 2+MULTI VIT  PO) Take 1 capsule by mouth in the morning and at bedtime.   omeprazole (PRILOSEC) 20 MG capsule Take 1 capsule (20 mg total) by mouth daily as needed (reflux).   Propylene Glycol 0.6 % SOLN Place 1 drop into both eyes 2 (two) times daily. BID   rosuvastatin (CRESTOR) 5 MG tablet Take 1 tablet (5 mg total) by mouth every other day.   Turmeric (QC TUMERIC COMPLEX PO) Take 1,000 mg by mouth in the morning and at bedtime.   No facility-administered encounter medications on file as of 11/24/2020.  Reviewed chart prior to disease state call. Spoke with patient regarding BP  Recent Office Vitals: BP Readings from Last 3 Encounters:  10/29/20 122/62  10/23/20 (!) 214/95  10/22/20 (!) 184/76   Pulse Readings from Last 3 Encounters:  10/29/20 74  10/23/20 80  10/22/20 (!) 57    Wt Readings from Last 3 Encounters:  10/29/20 236 lb (107 kg)  10/22/20 240 lb (108.9 kg)  07/24/20 236 lb (107 kg)     Kidney Function Lab Results  Component Value Date/Time   CREATININE 0.97 10/29/2020 08:57 AM   CREATININE 0.77 10/22/2020 06:02 PM   CREATININE 0.88 07/24/2019 08:23 AM   GFR 76.29 10/29/2020 08:57 AM   GFRNONAA >60 10/22/2020 06:02 PM   GFRAA 85 (L) 12/09/2012 01:15 PM    BMP Latest Ref Rng & Units 10/29/2020 10/22/2020 07/24/2020  Glucose 70 - 99 mg/dL 94 96 108(H)  BUN 6 - 23 mg/dL 18 12 10   Creatinine 0.40 - 1.50 mg/dL 0.97 0.77 0.91  BUN/Creat Ratio 6 - 22 (calc) - - -  Sodium 135 - 145 mEq/L 133(L) 138 137  Potassium 3.5 - 5.1 mEq/L  4.1 3.6 4.5  Chloride 96 - 112 mEq/L 102 106 102  CO2 19 - 32 mEq/L 23 23 26   Calcium 8.4 - 10.5 mg/dL 9.3 9.5 9.5    Current antihypertensive regimen:  Losartan 100 mg 1 tablet daily - in AM  HCTZ - 25 mg How often are you checking your Blood Pressure? Patient reports he has been checking 2 times a week. Current home BP readings: Patient reports his home readings have been averaging 165/80 he denies any headaches or dizziness currently. He reports  he could not locate his book of readings but will write  down the next ones and report back to me with the numbers. What recent interventions/DTPs have been made by any provider to improve Blood Pressure control since last CPP Visit: Patient was started on HCTZ at the hospital Any recent hospitalizations or ED visits since last visit with CPP? Yes What diet changes have been made to improve Blood Pressure Control?  Patient reports he does eat out some but not everyday is watching his sodium and enjoys peanut butter sandwiches. What exercise is being done to improve your Blood Pressure Control?  Patient is active with his grandchildren he transports them back and forth to school as well.  Adherence Review: Is the patient currently on ACE/ARB medication? No Does the patient have >5 day gap between last estimated fill dates? No   Care Gaps: Bp - 122/62 (10/29/20) TDAP - Overdue Zostervax - Overdue COVID Booster #4 (Moderna) - Overdue AWV was 02/11/21 Flu Vaccine - Overdue CCM- 2/23  Star Rating Drugs: Rosuvastatin 5mg  - Last filled 11/23/2020 90DS at [place] 100mg  - Last filled 10/29/2020 90DS at [place] Pharmacist Assistant [phone]

## 2020-11-24 NOTE — Progress Notes (Deleted)
    Chronic Care Management Pharmacy Assistant   Name: Marcus Gomez.  MRN: [id number] DOB: 1945-03-08  Marcus Gomez. is an 75 y.o. year old male who presents for his {initial or follow SF:68127517} CCM visit with the clinical pharmacist.  Reason for Encounter: {Follow-Up Reason:24148}   Conditions to be addressed/monitored: {CCM ASSESSMENT DISEASE OPTIONS:25047}  Primary concerns for visit include: ***   Recent office visits:  ***  Recent consult visits:  [facility] visits:  {Hospital DC Yes/No:21091515}  Medications: Outpatient Encounter Medications as of 11/24/2020  Medication Sig   cyclobenzaprine (FLEXERIL) 10 MG tablet Take 1 tablet (10 mg total) by mouth 3 (three) times daily as needed for muscle spasms.   EPINEPHrine 0.3 mg/0.3 mL IJ SOAJ injection Inject 0.3 mLs (0.3 mg total) into the muscle once as needed for up to 1 dose for anaphylaxis.   hydrochlorothiazide (HYDRODIURIL) 25 MG tablet Take 1 tablet (25 mg total) by mouth daily.   losartan (COZAAR) 100 MG tablet Take 1 tablet (100 mg total) by mouth daily.   montelukast (SINGULAIR) 10 MG tablet TAKE 1 TABLET BY MOUTH AT BEDTIME (Patient taking differently: Take 10 mg by mouth at bedtime.)   Multiple Vitamins-Minerals (PRESERVISION AREDS 2+MULTI VIT PO) Take 1 capsule by mouth in the morning and at bedtime.   omeprazole (PRILOSEC) 20 MG capsule Take 1 capsule (20 mg total) by mouth daily as needed (reflux).   Propylene Glycol 0.6 % SOLN Place 1 drop into both eyes 2 (two) times daily. BID   rosuvastatin (CRESTOR) 5 MG tablet Take 1 tablet (5 mg total) by mouth every other day.   Turmeric (QC TUMERIC COMPLEX PO) Take 1,000 mg by mouth in the morning and at bedtime.   No facility-administered encounter medications on file as of 11/24/2020.    Care Gaps:  Star Rating Drugs:  SIG***

## 2020-11-30 ENCOUNTER — Ambulatory Visit (INDEPENDENT_AMBULATORY_CARE_PROVIDER_SITE_OTHER): Payer: PPO | Admitting: *Deleted

## 2020-11-30 DIAGNOSIS — J309 Allergic rhinitis, unspecified: Secondary | ICD-10-CM | POA: Diagnosis not present

## 2020-12-07 ENCOUNTER — Ambulatory Visit (INDEPENDENT_AMBULATORY_CARE_PROVIDER_SITE_OTHER): Payer: PPO

## 2020-12-07 DIAGNOSIS — J309 Allergic rhinitis, unspecified: Secondary | ICD-10-CM | POA: Diagnosis not present

## 2020-12-14 ENCOUNTER — Ambulatory Visit (INDEPENDENT_AMBULATORY_CARE_PROVIDER_SITE_OTHER): Payer: PPO

## 2020-12-14 DIAGNOSIS — J309 Allergic rhinitis, unspecified: Secondary | ICD-10-CM | POA: Diagnosis not present

## 2020-12-16 ENCOUNTER — Ambulatory Visit: Payer: PPO | Admitting: Allergy

## 2020-12-21 ENCOUNTER — Ambulatory Visit (INDEPENDENT_AMBULATORY_CARE_PROVIDER_SITE_OTHER): Payer: PPO

## 2020-12-21 DIAGNOSIS — J309 Allergic rhinitis, unspecified: Secondary | ICD-10-CM

## 2020-12-25 ENCOUNTER — Other Ambulatory Visit: Payer: Self-pay

## 2020-12-25 ENCOUNTER — Ambulatory Visit (INDEPENDENT_AMBULATORY_CARE_PROVIDER_SITE_OTHER): Payer: PPO | Admitting: Allergy

## 2020-12-25 ENCOUNTER — Encounter: Payer: Self-pay | Admitting: Allergy

## 2020-12-25 VITALS — BP 148/70 | HR 64 | Temp 98.2°F | Resp 20 | Ht 69.0 in | Wt 239.8 lb

## 2020-12-25 DIAGNOSIS — J3089 Other allergic rhinitis: Secondary | ICD-10-CM | POA: Diagnosis not present

## 2020-12-25 DIAGNOSIS — K219 Gastro-esophageal reflux disease without esophagitis: Secondary | ICD-10-CM

## 2020-12-25 NOTE — Patient Instructions (Addendum)
Perennial and seasonal allergic rhinitis 2019 skin testing was positive to grass, trees, mold, cat, dog, cockroach and dust mites. Continue environmental control measures. Continue allergy injections.  Try to stop Singulair 10mg . If you notice symptoms then okay to restart.  Use Nasacort (triamcinolone) nasal spray 1 spray per nostril twice a day as needed for nasal congestion. Sample given. Use azelastine nasal spray 1-2 sprays per nostril twice a day as needed for runny nose/drainage.  Use over the counter antihistamines such as Zyrtec (cetirizine), Claritin (loratadine), Allegra (fexofenadine), or Xyzal (levocetirizine) daily as needed. May switch antihistamines every few months. Nasal saline spray (i.e., Simply Saline) or nasal saline lavage (i.e., NeilMed) is recommended as needed and prior to medicated nasal sprays.   GERD (gastroesophageal reflux disease) Continue appropriate reflux lifestyle modifications. Continue omeprazole 20mg  daily in the mornings if needed. No food/drink for 30 minutes afterwards.   Follow up in 6 months or sooner if needed.

## 2020-12-25 NOTE — Progress Notes (Signed)
Follow Up Note  RE: Marcus Gomez. MRN: [id number] DOB: 20-Oct-1945 Date of Office Visit: 12/25/2020  Referring provider: [name], NP Primary care provider: [name], NP  Chief Complaint: Allergic Rhinitis  (Just a check up. Don't feel too good today but no complaints other than that. Sinus headache)  History of Present Illness: I had the pleasure of seeing Marcus Gomez for a follow up visit at the [facility] [place] on 12/25/2020. He is a 75 y.o. male, who is being followed for allergic rhinitis on AIT and GERD. His previous allergy office visit was on 06/15/2020 with Dr. Maudie Mercury. Today is a regular follow up visit.  Perennial and seasonal allergic rhinitis Woke up with morning with some sinus headaches/pressure.  No fevers/chills.  Patient usually does not use any nasal sprays but used azelastine this morning.  Currently on Singulair 10mg  every other day with no worsening symptoms.  No live Christmas trees at home.  No issues with the allergy injections.   Doing saline rinse in the evenings with good benefit.    GERD Takes omeprazole 20mg  in the morning with good benefit.  Notices symptoms if misses a dose.   Assessment and Plan: Marcus Gomez is a 75 y.o. male with: Perennial and seasonal allergic rhinitis Past history - 2019 skin testing was positive to grass, trees, mold, cat, dog, cockroach and dust mites. Started AIT on 11/12/2018 (G-T-DM-D & M-C-CR). Interim history - stable just some sinus headaches this morning.  Continue environmental control measures. Continue allergy injections.  Try to stop Singulair 10mg . If you notice symptoms then okay to restart.  Use Nasacort (triamcinolone) nasal spray 1 spray per nostril twice a day as needed for nasal congestion. Sample given. Use azelastine nasal spray 1-2 sprays per nostril twice a day as needed for runny nose/drainage. Use over the counter antihistamines such as Zyrtec (cetirizine),  Claritin (loratadine), Allegra (fexofenadine), or Xyzal (levocetirizine) daily as needed. May switch antihistamines every few months. Nasal saline spray (i.e., Simply Saline) or nasal saline lavage (i.e., NeilMed) is recommended as needed and prior to medicated nasal sprays.  GERD (gastroesophageal reflux disease) Noticed symptoms if misses a dose.  Continue appropriate reflux lifestyle modifications. Continue omeprazole 20mg  daily in the mornings if needed. No food/drink for 30 minutes afterwards.   Return in about 6 months (around 06/25/2021).  No orders of the defined types were placed in this encounter.  Lab Orders  No laboratory test(s) ordered today    Diagnostics: None.   Medication List:  Current Outpatient Medications  Medication Sig Dispense Refill   EPINEPHrine 0.3 mg/0.3 mL IJ SOAJ injection Inject 0.3 mLs (0.3 mg total) into the muscle once as needed for up to 1 dose for anaphylaxis. 0.3 mL 1   hydrochlorothiazide (HYDRODIURIL) 25 MG tablet Take 1 tablet (25 mg total) by mouth daily. 90 tablet 1   losartan (COZAAR) 100 MG tablet Take 1 tablet (100 mg total) by mouth daily. 90 tablet 1   montelukast (SINGULAIR) 10 MG tablet TAKE 1 TABLET BY MOUTH AT BEDTIME (Patient taking differently: Take 10 mg by mouth at bedtime.) 90 tablet 2   Multiple Vitamins-Minerals (PRESERVISION AREDS 2+MULTI VIT PO) Take 1 capsule by mouth in the morning and at bedtime.     omeprazole (PRILOSEC) 20 MG capsule Take 1 capsule (20 mg total) by mouth daily as needed (reflux). 30 capsule 5   rosuvastatin (CRESTOR) 5 MG tablet Take 1 tablet (5 mg total) by mouth every other  day. 45 tablet 3   Turmeric (QC TUMERIC COMPLEX PO) Take 1,000 mg by mouth in the morning and at bedtime.     cyclobenzaprine (FLEXERIL) 10 MG tablet Take 1 tablet (10 mg total) by mouth 3 (three) times daily as needed for muscle spasms. (Patient not taking: Reported on 12/25/2020) 30 tablet 0   Propylene Glycol 0.6 % SOLN Place 1  drop into both eyes 2 (two) times daily. BID (Patient not taking: Reported on 12/25/2020)     No current facility-administered medications for this visit.   Allergies: Allergies  Allergen Reactions   Lisinopril Cough   I reviewed his past medical history, social history, family history, and environmental history and no significant changes have been reported from his previous visit.  Review of Systems  Constitutional:  Negative for appetite change, chills, fever and unexpected weight change.  HENT:  Positive for sinus pressure. Negative for postnasal drip and rhinorrhea.   Eyes:  Negative for itching.  Respiratory:  Negative for cough, chest tightness, shortness of breath and wheezing.   Gastrointestinal:  Negative for abdominal pain.  Skin:  Negative for rash.  Allergic/Immunologic: Positive for environmental allergies.  Neurological:  Negative for headaches.   Objective: BP (!) 148/70    Pulse 64    Temp 98.2 F (36.8 C) (Temporal)    Resp 20    Ht 5\' 9"  (1.753 m)    Wt 239 lb 12.8 oz (108.8 kg)    SpO2 99%    BMI 35.41 kg/m  Body mass index is 35.41 kg/m. Physical Exam Vitals and nursing note reviewed.  Constitutional:      Appearance: Normal appearance. He is well-developed.  HENT:     Head: Normocephalic and atraumatic.     Right Ear: Tympanic membrane and external ear normal.     Left Ear: Tympanic membrane and external ear normal.     Nose: Nose normal.     Mouth/Throat:     Mouth: Mucous membranes are moist.     Pharynx: Oropharynx is clear.  Eyes:     Conjunctiva/sclera: Conjunctivae normal.  Cardiovascular:     Rate and Rhythm: Normal rate and regular rhythm.     Heart sounds: Normal heart sounds. No murmur heard. Pulmonary:     Effort: Pulmonary effort is normal.     Breath sounds: Normal breath sounds. No wheezing, rhonchi or rales.  Musculoskeletal:     Cervical back: Neck supple.  Skin:    General: Skin is warm.     Findings: No rash.  Neurological:      Mental Status: He is alert and oriented to person, place, and time.  Psychiatric:        Behavior: Behavior normal.  Previous notes and tests were reviewed. The plan was reviewed with the patient/family, and all questions/concerned were addressed.  It was my pleasure to see Saud today and participate in his care. Please feel free to contact me with any questions or concerns.  Sincerely,  Rexene Alberts, DO Allergy & Immunology  [facility] office: [place] office: [phone]

## 2020-12-25 NOTE — Assessment & Plan Note (Signed)
Noticed symptoms if misses a dose.   Continue appropriate reflux lifestyle modifications.  Continue omeprazole 20mg  daily in the mornings if needed.  No food/drink for 30 minutes afterwards.

## 2020-12-25 NOTE — Assessment & Plan Note (Signed)
Past history - 2019 skin testing was positive to grass, trees, mold, cat, dog, cockroach and dust mites. Started AIT on 11/12/2018 (G-T-DM-D & M-C-CR). Interim history - stable just some sinus headaches this morning.   Continue environmental control measures.  Continue allergy injections.   Try to stop Singulair 10mg .  If you notice symptoms then okay to restart.   Use Nasacort (triamcinolone) nasal spray 1 spray per nostril twice a day as needed for nasal congestion. Sample given.  Use azelastine nasal spray 1-2 sprays per nostril twice a day as needed for runny nose/drainage.  Use over the counter antihistamines such as Zyrtec (cetirizine), Claritin (loratadine), Allegra (fexofenadine), or Xyzal (levocetirizine) daily as needed. May switch antihistamines every few months.  Nasal saline spray (i.e., Simply Saline) or nasal saline lavage (i.e., NeilMed) is recommended as needed and prior to medicated nasal sprays.

## 2020-12-28 ENCOUNTER — Ambulatory Visit (INDEPENDENT_AMBULATORY_CARE_PROVIDER_SITE_OTHER): Payer: PPO

## 2020-12-28 DIAGNOSIS — J309 Allergic rhinitis, unspecified: Secondary | ICD-10-CM

## 2021-01-08 DIAGNOSIS — M19042 Primary osteoarthritis, left hand: Secondary | ICD-10-CM | POA: Diagnosis not present

## 2021-01-11 ENCOUNTER — Telehealth: Payer: Self-pay

## 2021-01-11 MED ORDER — PAXLOVID (300/100) 20 X 150 MG & 10 X 100MG PO TBPK: 3.0000 | ORAL_TABLET | Freq: Two times a day (BID) | ORAL | 0 refills | Status: DC

## 2021-01-11 NOTE — Telephone Encounter (Signed)
Called and informed the patient of medication being sent in. Patient verbalized understanding.

## 2021-01-11 NOTE — Telephone Encounter (Signed)
Patient called stating he tested positive for COVID this morning. He is having a bad headache and a lot of head congestion.  Patient is wondering what he could take to help with they symptoms?  [facility]

## 2021-01-11 NOTE — Telephone Encounter (Signed)
Spoke with patient, he stated that his headache and nasal congestion started yesterday afternoon. Patient also stated that his body aches and started yesterday afternoon. Please advise

## 2021-01-11 NOTE — Telephone Encounter (Signed)
Called and left a voicemail asking for patient to return call to inform him that prescription has been sent in.

## 2021-01-11 NOTE — Telephone Encounter (Signed)
I called the patient and left a message for the patient to call the office back in regards to his symptoms.

## 2021-01-11 NOTE — Telephone Encounter (Signed)
Done

## 2021-01-11 NOTE — Telephone Encounter (Signed)
The patient called back to go over medication treatment.  He tested positive for Covid today and his symptom started yesterday evening. He would like the Paxlovid sent to the [facility] [address], [place], [place] [address].

## 2021-01-11 NOTE — Addendum Note (Signed)
Addended by: Garnet Sierras on: 01/11/2021 12:28 PM   Modules accepted: Orders

## 2021-01-11 NOTE — Telephone Encounter (Signed)
He can take ibuprofen or tylenol for the headaches.  For the nasal congestion, he can take the Nasacort that he has or okay to use afrin for a few days.   Did he do the covid test at home? I can send in paxlovid the antiviral medication as well if he wants to take it.

## 2021-01-20 ENCOUNTER — Ambulatory Visit (INDEPENDENT_AMBULATORY_CARE_PROVIDER_SITE_OTHER): Payer: PPO

## 2021-01-20 DIAGNOSIS — J309 Allergic rhinitis, unspecified: Secondary | ICD-10-CM

## 2021-01-28 ENCOUNTER — Ambulatory Visit (INDEPENDENT_AMBULATORY_CARE_PROVIDER_SITE_OTHER): Payer: PPO

## 2021-01-28 VITALS — BP 122/62 | HR 66 | Temp 98.1°F | Ht 69.0 in | Wt 241.0 lb

## 2021-01-28 DIAGNOSIS — Z0000 Encounter for general adult medical examination without abnormal findings: Secondary | ICD-10-CM

## 2021-01-28 NOTE — Patient Instructions (Addendum)
[name] , Thank you for taking time to come for your Medicare Wellness Visit. I appreciate your ongoing commitment to your health goals. Please review the following plan we discussed and let me know if I can assist you in the future.   These are the goals we discussed:  Goals      Patient Stated     I will continue to walk 0.75 mile -1.5 miles per day. Patient will ride bike more.        This is a list of the screening recommended for you and due dates:  Health Maintenance  Topic Date Due   Flu Shot  04/09/2021*   Zoster (Shingles) Vaccine (1 of 2) 04/28/2021*   Tetanus Vaccine  01/28/2022*   Colon Cancer Screening  01/08/2023   Pneumonia Vaccine  Completed   COVID-19 Vaccine  Completed   Hepatitis C Screening: USPSTF Recommendation to screen - Ages 18-79 yo.  Completed   HPV Vaccine  Aged Out  *Topic was postponed. The date shown is not the original due date.   Advanced directives: Yes  Conditions/risks identified: None  Next appointment: Follow up in one year for your annual wellness visit. Preventive Care 13 Years and Older, Male Preventive care refers to lifestyle choices and visits with your health care provider that can promote health and wellness. What does preventive care include? A yearly physical exam. This is also called an annual well check. Dental exams once or twice a year. Routine eye exams. Ask your health care provider how often you should have your eyes checked. Personal lifestyle choices, including: Daily care of your teeth and gums. Regular physical activity. Eating a healthy diet. Avoiding tobacco and drug use. Limiting alcohol use. Practicing safe sex. Taking low doses of aspirin every day. Taking vitamin and mineral supplements as recommended by your health care provider. What happens during an annual well check? The services and screenings done by your health care provider during your annual well check will depend on your age, overall  health, lifestyle risk factors, and family history of disease. Counseling  Your health care provider may ask you questions about your: Alcohol use. Tobacco use. Drug use. Emotional well-being. Home and relationship well-being. Sexual activity. Eating habits. History of falls. Memory and ability to understand (cognition). Work and work Statistician. Screening  You may have the following tests or measurements: Height, weight, and BMI. Blood pressure. Lipid and cholesterol levels. These may be checked every 5 years, or more frequently if you are over 36 years old. Skin check. Lung cancer screening. You may have this screening every year starting at age 58 if you have a 30-pack-year history of smoking and currently smoke or have quit within the past 15 years. Fecal occult blood test (FOBT) of the stool. You may have this test every year starting at age 23. Flexible sigmoidoscopy or colonoscopy. You may have a sigmoidoscopy every 5 years or a colonoscopy every 10 years starting at age 31. Prostate cancer screening. Recommendations will vary depending on your family history and other risks. Hepatitis C blood test. Hepatitis B blood test. Sexually transmitted disease (STD) testing. Diabetes screening. This is done by checking your blood sugar (glucose) after you have not eaten for a while (fasting). You may have this done every 1-3 years. Abdominal aortic aneurysm (AAA) screening. You may need this if you are a current or former smoker. Osteoporosis. You may be screened starting at age 58 if you are at high risk. Talk with your  health care provider about your test results, treatment options, and if necessary, the need for more tests. Vaccines  Your health care provider may recommend certain vaccines, such as: Influenza vaccine. This is recommended every year. Tetanus, diphtheria, and acellular pertussis (Tdap, Td) vaccine. You may need a Td booster every 10 years. Zoster vaccine. You may  need this after age 53. Pneumococcal 13-valent conjugate (PCV13) vaccine. One dose is recommended after age 85. Pneumococcal polysaccharide (PPSV23) vaccine. One dose is recommended after age 15. Talk to your health care provider about which screenings and vaccines you need and how often you need them. This information is not intended to replace advice given to you by your health care provider. Make sure you discuss any questions you have with your health care provider. Document Released: 01/23/2015 Document Revised: 09/16/2015 Document Reviewed: 10/28/2014 Elsevier Interactive Patient Education  2017 [employer] Prevention in the Home Falls can cause injuries. They can happen to people of all ages. There are many things you can do to make your home safe and to help prevent falls. What can I do on the outside of my home? Regularly fix the edges of walkways and driveways and fix any cracks. Remove anything that might make you trip as you walk through a door, such as a raised step or threshold. Trim any bushes or trees on the path to your home. Use bright outdoor lighting. Clear any walking paths of anything that might make someone trip, such as rocks or tools. Regularly check to see if handrails are loose or broken. Make sure that both sides of any steps have handrails. Any raised decks and porches should have guardrails on the edges. Have any leaves, snow, or ice cleared regularly. Use sand or salt on walking paths during winter. Clean up any spills in your garage right away. This includes oil or grease spills. What can I do in the bathroom? Use night lights. Install grab bars by the toilet and in the tub and shower. Do not use towel bars as grab bars. Use non-skid mats or decals in the tub or shower. If you need to sit down in the shower, use a plastic, non-slip stool. Keep the floor dry. Clean up any water that spills on the floor as soon as it happens. Remove soap buildup in  the tub or shower regularly. Attach bath mats securely with double-sided non-slip rug tape. Do not have throw rugs and other things on the floor that can make you trip. What can I do in the bedroom? Use night lights. Make sure that you have a light by your bed that is easy to reach. Do not use any sheets or blankets that are too big for your bed. They should not hang down onto the floor. Have a firm chair that has side arms. You can use this for support while you get dressed. Do not have throw rugs and other things on the floor that can make you trip. What can I do in the kitchen? Clean up any spills right away. Avoid walking on wet floors. Keep items that you use a lot in easy-to-reach places. If you need to reach something above you, use a strong step stool that has a grab bar. Keep electrical cords out of the way. Do not use floor polish or wax that makes floors slippery. If you must use wax, use non-skid floor wax. Do not have throw rugs and other things on the floor that can make you trip. What  can I do with my stairs? Do not leave any items on the stairs. Make sure that there are handrails on both sides of the stairs and use them. Fix handrails that are broken or loose. Make sure that handrails are as long as the stairways. Check any carpeting to make sure that it is firmly attached to the stairs. Fix any carpet that is loose or worn. Avoid having throw rugs at the top or bottom of the stairs. If you do have throw rugs, attach them to the floor with carpet tape. Make sure that you have a light switch at the top of the stairs and the bottom of the stairs. If you do not have them, ask someone to add them for you. What else can I do to help prevent falls? Wear shoes that: Do not have high heels. Have rubber bottoms. Are comfortable and fit you well. Are closed at the toe. Do not wear sandals. If you use a stepladder: Make sure that it is fully opened. Do not climb a closed  stepladder. Make sure that both sides of the stepladder are locked into place. Ask someone to hold it for you, if possible. Clearly Xavious and make sure that you can see: Any grab bars or handrails. First and last steps. Where the edge of each step is. Use tools that help you move around (mobility aids) if they are needed. These include: Canes. Walkers. Scooters. Crutches. Turn on the lights when you go into a dark area. Replace any light bulbs as soon as they burn out. Set up your furniture so you have a clear path. Avoid moving your furniture around. If any of your floors are uneven, fix them. If there are any pets around you, be aware of where they are. Review your medicines with your doctor. Some medicines can make you feel dizzy. This can increase your chance of falling. Ask your doctor what other things that you can do to help prevent falls. This information is not intended to replace advice given to you by your health care provider. Make sure you discuss any questions you have with your health care provider. Document Released: 10/23/2008 Document Revised: 06/04/2015 Document Reviewed: 01/31/2014 Elsevier Interactive Patient Education  2017 [employer].

## 2021-01-28 NOTE — Progress Notes (Signed)
Subjective:   Marcus Gomez. is a 77 y.o. male who presents for Medicare Annual/Subsequent preventive examination.  Review of Systems    No ROS Cardiac Risk Factors include: advanced age (>27men, >65 women);hypertension;male gender    Objective:    Today's Vitals   01/28/21 0814  BP: 122/62  Pulse: 66  Temp: 98.1 F (36.7 C)  SpO2: 98%  Weight: 241 lb (109.3 kg)  Height: 5\' 9"  (1.753 m)   Body mass index is 35.59 kg/m.  Advanced Directives 01/28/2021 10/22/2020 02/13/2020 09/28/2014  Does Patient Have a Medical Advance Directive? Yes No Yes Yes  Type of Paramedic of [place];Living will - [place];Living will Living will;Healthcare Power of [place];Out of facility DNR (pink MOST or yellow form)  Does patient want to make changes to medical advance directive? No - Patient declined - No - Patient declined -  Copy of [place] in Chart? No - copy requested - No - copy requested No - copy requested    Current Medications (verified) Outpatient Encounter Medications as of 01/28/2021  Medication Sig   cyclobenzaprine (FLEXERIL) 10 MG tablet Take 1 tablet (10 mg total) by mouth 3 (three) times daily as needed for muscle spasms. (Patient not taking: Reported on 12/25/2020)   EPINEPHrine 0.3 mg/0.3 mL IJ SOAJ injection Inject 0.3 mLs (0.3 mg total) into the muscle once as needed for up to 1 dose for anaphylaxis.   hydrochlorothiazide (HYDRODIURIL) 25 MG tablet Take 1 tablet (25 mg total) by mouth daily.   losartan (COZAAR) 100 MG tablet Take 1 tablet (100 mg total) by mouth daily.   montelukast (SINGULAIR) 10 MG tablet TAKE 1 TABLET BY MOUTH AT BEDTIME (Patient taking differently: Take 10 mg by mouth at bedtime.)   Multiple Vitamins-Minerals (PRESERVISION AREDS 2+MULTI VIT PO) Take 1 capsule by mouth in the morning and at bedtime.   nirmatrelvir & ritonavir (PAXLOVID, 300/100,) 20 x 150 MG & 10 x 100MG  TBPK Take  3 tablets by mouth in the morning and at bedtime. Take 300 mg nirmatrelvir (two 150 mg tablets) with 100 mg ritonavir (one 100 mg tablet) twice a day.   omeprazole (PRILOSEC) 20 MG capsule Take 1 capsule (20 mg total) by mouth daily as needed (reflux).   Propylene Glycol 0.6 % SOLN Place 1 drop into both eyes 2 (two) times daily. BID (Patient not taking: Reported on 12/25/2020)   rosuvastatin (CRESTOR) 5 MG tablet Take 1 tablet (5 mg total) by mouth every other day.   Turmeric (QC TUMERIC COMPLEX PO) Take 1,000 mg by mouth in the morning and at bedtime.   No facility-administered encounter medications on file as of 01/28/2021.    Allergies (verified) Lisinopril   History: Past Medical History:  Diagnosis Date   Essential hypertension    Prostate cancer ([place])    Remission   Past Surgical History:  Procedure Laterality Date   KNEE SURGERY Bilateral 2015 & 2014   SHOULDER SURGERY Right 2016   Family History  Problem Relation Age of Onset   Stroke Mother    Allergies Father    COPD Father        never smoker   Rheum arthritis Father    Allergic rhinitis Neg Hx    Angioedema Neg Hx    Asthma Neg Hx    Eczema Neg Hx    Immunodeficiency Neg Hx    Urticaria Neg Hx    Social History   Socioeconomic History  Marital status: Divorced    Spouse name: Not on file   Number of children: Not on file   Years of education: Not on file   Highest education level: Not on file  Occupational History   Not on file  Tobacco Use   Smoking status: Never   Smokeless tobacco: Never  Vaping Use   Vaping Use: Never used  Substance and Sexual Activity   Alcohol use: Yes    Comment: socially    Drug use: No   Sexual activity: Yes  Other Topics Concern   Not on file  Social History Narrative   Retired - Biomedical scientist    Divorced x 4    Two children - 2 girls both live Radiation protection practitioner.       Social Determinants of Health   Financial Resource Strain: Low Risk    Difficulty of Paying Living  Expenses: Not hard at all  Food Insecurity: No Food Insecurity   Worried About Charity fundraiser in the Last Year: Never true   Arboriculturist in the Last Year: Never true  Transportation Needs: No Transportation Needs   Lack of Transportation (Medical): No   Lack of Transportation (Non-Medical): No  Physical Activity: Sufficiently Active   Days of Exercise per Week: 7 days   Minutes of Exercise per Session: 30 min  Stress: No Stress Concern Present   Feeling of Stress : Not at all  Social Connections: Moderately Isolated   Frequency of Communication with Friends and Family: More than three times a week   Frequency of Social Gatherings with Friends and Family: More than three times a week   Attends Religious Services: Never   Marine scientist or Organizations: Yes   Attends Music therapist: More than 4 times per year   Marital Status: Divorced    Clinical Intake:  Pre-visit preparation completed: Yes Diabetic? No  Interpreter Needed?: No Activities of Daily Living In your present state of health, do you have any difficulty performing the following activities: 01/28/2021 02/13/2020  Hearing? N N  Vision? N N  Difficulty concentrating or making decisions? N N  Walking or climbing stairs? N Y  Comment - has knee pain, and bone spur to foot  Dressing or bathing? N N  Doing errands, shopping? N N  Preparing Food and eating ? N N  Using the Toilet? N N  In the past six months, have you accidently leaked urine? Y N  Comment Patient followed by Urologist -  Do you have problems with loss of bowel control? N N  Managing your Medications? N N  Managing your Finances? N N  Housekeeping or managing your Housekeeping? N N  Some recent data might be hidden    Patient Care Team: [name], NP as PCP - General (Family Medicine) [name], [facility] as Pharmacist (Pharmacist) Ortho, [facility] (Orthopedic Surgery)  Indicate any recent Medical Services you may  have received from other than [facility] providers in the past year (date may be approximate).     Assessment:   This is a routine wellness examination for [place].  Hearing/Vision screen Hearing Screening - Comments:: No difficulty hearing Vision Screening - Comments:: Wears reading glasses. Followed by Dr Zigmund Daniel  Dietary issues and exercise activities discussed: Current Exercise Habits: Home exercise routine, Type of exercise: stretching;walking, Time (Minutes): 30, Frequency (Times/Week): 7, Weekly Exercise (Minutes/Week): 210, Intensity: Mild   Goals Addressed  This Visit's Progress    Patient Stated       I will continue to walk 0.75 mile -1.5 miles per day. Patient will ride bike more.       Depression Screen PHQ 2/9 Scores 01/28/2021 02/13/2020 01/25/2018 10/24/2017 03/16/2016  PHQ - 2 Score 0 0 0 0 0    Fall Risk Fall Risk  01/28/2021 02/13/2020 08/06/2019 01/25/2018 10/24/2017  Falls in the past year? - 1 1 0 No  Comment - - Emmi Telephone Survey: data to providers prior to load - -  Number falls in past yr: 0 1 1 - -  Comment - - Emmi Telephone Survey Actual Response = 1 - -  Injury with Fall? 0 0 1 - -  Risk for fall due to : - History of fall(s);Impaired balance/gait - - -  Follow up - Falls evaluation completed;Falls prevention discussed - - -    FALL RISK PREVENTION PERTAINING TO THE HOME:  Any stairs in or around the home? Yes  If so, are there any without handrails? No  Home free of loose throw rugs in walkways, pet beds, electrical cords, etc? Yes  Adequate lighting in your home to reduce risk of falls? Yes   ASSISTIVE DEVICES UTILIZED TO PREVENT FALLS:  Life alert? No  Use of a cane, walker or w/c? No  Grab bars in the bathroom? No  Shower chair or bench in shower? No  Elevated toilet seat or a handicapped toilet? Yes   TIMED UP AND GO:  Was the test performed? Yes .  Length of time to ambulate 10 feet: 5 sec.   Gait steady and fast without  use of assistive device  Cognitive Function:    6CIT Screen 01/28/2021  What Year? 0 points  What month? 0 points  What time? 0 points  Count back from 20 0 points  Months in reverse 0 points  Repeat phrase 0 points  Total Score 0    Immunizations Immunization History  Administered Date(s) Administered   Fluad Quad(high Dose 65+) 02/13/2020   Influenza, High Dose Seasonal PF 11/03/2017   MODERNA COVID-19 SARS-COV-2 PEDS BIVALENT BOOSTER 6Y-11Y 12/21/2019   Moderna SARS-COV2 Booster Vaccination 12/21/2019   Moderna Sars-Covid-2 Vaccination 02/22/2019, 03/22/2019   Pneumococcal Conjugate-13 03/16/2016   Pneumococcal Polysaccharide-23 01/25/2018    TDAP status: Due, Education has been provided regarding the importance of this vaccine. Advised may receive this vaccine at local pharmacy or Health Dept. Aware to provide a copy of the vaccination record if obtained from local pharmacy or Health Dept. Verbalized acceptance and understanding.  Flu Vaccine status: Due, Education has been provided regarding the importance of this vaccine. Advised may receive this vaccine at local pharmacy or Health Dept. Aware to provide a copy of the vaccination record if obtained from local pharmacy or Health Dept. Verbalized acceptance and understanding.    Covid-19 vaccine status: Information provided on how to obtain vaccines.   Qualifies for Shingles Vaccine? Yes   Zostavax completed No   Shingrix Completed?: No.    Education has been provided regarding the importance of this vaccine. Patient has been advised to call insurance company to determine out of pocket expense if they have not yet received this vaccine. Advised may also receive vaccine at local pharmacy or Health Dept. Verbalized acceptance and understanding.  Screening Tests Health Maintenance  Topic Date Due   INFLUENZA VACCINE  04/09/2021 (Originally 08/10/2020)   Zoster Vaccines- Shingrix (1 of 2) 04/28/2021 (Originally 03/04/1995)  TETANUS/TDAP  01/28/2022 (Originally 03/03/1964)   COLONOSCOPY (Pts 45-18yrs Insurance coverage will need to be confirmed)  01/08/2023   Pneumonia Vaccine 91+ Years old  Completed   COVID-19 Vaccine  Completed   Hepatitis C Screening  Completed   HPV VACCINES  Aged Out    Health Maintenance  There are no preventive care reminders to display for this patient.   Additional Screening:   Vision Screening: Recommended annual ophthalmology exams for early detection of glaucoma and other disorders of the eye. Is the patient up to date with their annual eye exam?  Yes  Who is the provider or what is the name of the office in which the patient attends annual eye exams? Followed by Dr Zigmund Daniel  Dental Screening: Recommended annual dental exams for proper oral hygiene  Community Resource Referral / Chronic Care Management:  CRR required this visit?  No   CCM required this visit?  No      Plan:     I have personally reviewed and noted the following in the patients chart:   Medical and social history Use of alcohol, tobacco or illicit drugs  Current medications and supplements including opioid prescriptions. Patient is not currently taking opioid prescriptions. Functional ability and status Nutritional status Physical activity Advanced directives List of other physicians Hospitalizations, surgeries, and ER visits in previous 12 months Vitals Screenings to include cognitive, depression, and falls Referrals and appointments  In addition, I have reviewed and discussed with patient certain preventive protocols, quality metrics, and best practice recommendations. A written personalized care plan for preventive services as well as general preventive health recommendations were provided to patient.     Criselda Peaches, LPN   02/19/3126

## 2021-02-08 ENCOUNTER — Ambulatory Visit (INDEPENDENT_AMBULATORY_CARE_PROVIDER_SITE_OTHER): Payer: PPO

## 2021-02-08 DIAGNOSIS — J309 Allergic rhinitis, unspecified: Secondary | ICD-10-CM | POA: Diagnosis not present

## 2021-02-18 ENCOUNTER — Telehealth: Payer: Self-pay | Admitting: Pharmacist

## 2021-02-18 NOTE — Chronic Care Management (AMB) (Signed)
° ° °  Chronic Care Management Pharmacy Assistant   Name: Marcus Gomez.  MRN: [id number] DOB: 1945/04/29 02/18/21 APPOINTMENT REMINDER   Patient was reminded to have all medications, supplements and any blood glucose and blood pressure readings available for review with [name], Pharm. D, for telephone visit on 02/22/21 at 8:30.  Care Gaps: BP- 134/73 (01/08/21) AWV- 1/23  Star Rating Drug: Rosuvastatin 5mg  - Last filled 11/23/2020 90DS at [facility] Losartan 100mg  - Last filled 02/05/21 90DS at [place]  Any gaps in medications fill history?  None    Medications: Outpatient Encounter Medications as of 02/18/2021  Medication Sig   cyclobenzaprine (FLEXERIL) 10 MG tablet Take 1 tablet (10 mg total) by mouth 3 (three) times daily as needed for muscle spasms. (Patient not taking: Reported on 12/25/2020)   EPINEPHrine 0.3 mg/0.3 mL IJ SOAJ injection Inject 0.3 mLs (0.3 mg total) into the muscle once as needed for up to 1 dose for anaphylaxis.   hydrochlorothiazide (HYDRODIURIL) 25 MG tablet Take 1 tablet (25 mg total) by mouth daily.   losartan (COZAAR) 100 MG tablet Take 1 tablet (100 mg total) by mouth daily.   montelukast (SINGULAIR) 10 MG tablet TAKE 1 TABLET BY MOUTH AT BEDTIME (Patient taking differently: Take 10 mg by mouth at bedtime.)   Multiple Vitamins-Minerals (PRESERVISION AREDS 2+MULTI VIT PO) Take 1 capsule by mouth in the morning and at bedtime.   nirmatrelvir & ritonavir (PAXLOVID, 300/100,) 20 x 150 MG & 10 x 100MG  TBPK Take 3 tablets by mouth in the morning and at bedtime. Take 300 mg nirmatrelvir (two 150 mg tablets) with 100 mg ritonavir (one 100 mg tablet) twice a day.   omeprazole (PRILOSEC) 20 MG capsule Take 1 capsule (20 mg total) by mouth daily as needed (reflux).   Propylene Glycol 0.6 % SOLN Place 1 drop into both eyes 2 (two) times daily. BID (Patient not taking: Reported on 12/25/2020)   rosuvastatin (CRESTOR) 5 MG tablet Take 1 tablet (5 mg  total) by mouth every other day.   Turmeric (QC TUMERIC COMPLEX PO) Take 1,000 mg by mouth in the morning and at bedtime.   No facility-administered encounter medications on file as of 02/18/2021.     [place] Clinical Pharmacist Assistant [phone]

## 2021-02-22 ENCOUNTER — Ambulatory Visit (INDEPENDENT_AMBULATORY_CARE_PROVIDER_SITE_OTHER): Payer: PPO | Admitting: Pharmacist

## 2021-02-22 DIAGNOSIS — I10 Essential (primary) hypertension: Secondary | ICD-10-CM

## 2021-02-22 DIAGNOSIS — E782 Mixed hyperlipidemia: Secondary | ICD-10-CM

## 2021-02-22 NOTE — Progress Notes (Signed)
Chronic Care Management Pharmacy Note  10/19/2020 Name:  Marcus Gomez [facility]. MRN:  [id number] DOB:  03-11-1945  Summary: BP not at goal < 140/90 per recent home readings Pt reports BP has been fluctuating   Recommendations/Changes made from today's visit: -Recommended sitting down and waiting 10-15 mins prior to checking BP or averaging a few readings -Recommended moving losartan to the evening to see if this evens out BP lowering in the morning   Plan: Follow up BP assessment in 3-4 weeks  Subjective: Marcus Gamble. is an 76 y.o. year old male who is a primary patient of [name], NP.  The CCM team was consulted for assistance with disease management and care coordination needs.    Engaged with patient by telephone for follow up visit in response to provider referral for pharmacy case management and/or care coordination services.   Consent to Services:  The patient was given information about Chronic Care Management services, agreed to services, and gave verbal consent prior to initiation of services.  Please see initial visit note for detailed documentation.   Patient Care Team: [name], NP as PCP - General (Family Medicine) [name], [facility] as Pharmacist (Pharmacist) Manson Passey, [facility] (Orthopedic Surgery)  Recent office visits: 01/28/21 Rolene Arbour, LPN: Patient presented for AWV.  10/29/20 [name], NP - Patient presented for Essential Hypertension. No medication changes.  Recent consult visits: 02/08/21 Allergy: Patient presented for allergy injections.   01/08/21 Daryll Brod, MD (ortho): Patient presented for OA of left hand. Refilled celecoxib 200 mg daily.  12/25/20 Garnet Sierras, DO (allergy assoc) - Patient was seen for Perennial and seasonal allergic rhinitis and other issues. Plan to stop Singulair 10 mg.  09/29/20 Allergy: Patient presented for allergy injections.   Hospital visits: Medication Reconciliation was  completed by comparing discharge summary, patients EMR and Pharmacy list, and upon discussion with patient.   Patient presented to [facility] ED on 10/22/20 due to Dizziness and Hypertension. Patient was present for 9 hours.   New?Medications Started at [facility] Discharge:?? -started  hydrochlorothiazide 25 MG tablet   Medication Changes at Hospital Discharge: -Changed  None   Medications Discontinued at Hospital Discharge: -Stopped  None   Medications that remain the same after Hospital Discharge:??  -All other medications will remain the same.     Objective:  Lab Results  Component Value Date   CREATININE 0.97 10/29/2020   BUN 18 10/29/2020   GFR 76.29 10/29/2020   GFRNONAA >60 10/22/2020   GFRAA 85 (L) 12/09/2012   NA 133 (L) 10/29/2020   K 4.1 10/29/2020   CALCIUM 9.3 10/29/2020   CO2 23 10/29/2020   GLUCOSE 94 10/29/2020    Lab Results  Component Value Date/Time   HGBA1C 5.7 03/16/2016 09:14 AM   GFR 76.29 10/29/2020 08:57 AM   GFR 82.51 07/24/2020 09:06 AM    Last diabetic Eye exam: No results found for: HMDIABEYEEXA  Last diabetic Foot exam: No results found for: HMDIABFOOTEX   Lab Results  Component Value Date   CHOL 148 07/24/2020   HDL 51.50 07/24/2020   LDLCALC 77 07/24/2020   TRIG 93.0 07/24/2020   CHOLHDL 3 07/24/2020    Hepatic Function Latest Ref Rng & Units 07/24/2020 07/24/2019 01/25/2018  Total Protein 6.0 - 8.3 g/dL 6.8 6.5 6.8  Albumin 3.5 - 5.2 g/dL 4.3 - 4.2  AST 0 - 37 U/L 15 19 22   ALT 0 - 53 U/L 21 19 26  Alk Phosphatase 39 - 117 U/L 77 - 65  Total Bilirubin 0.2 - 1.2 mg/dL 1.9(H) 1.2 1.0  Bilirubin, Direct 0.0 - 0.3 mg/dL - - 0.2    Lab Results  Component Value Date/Time   TSH 1.31 07/24/2020 09:06 AM   TSH 1.91 07/24/2019 08:23 AM    CBC Latest Ref Rng & Units 10/22/2020 07/24/2020 07/24/2019  WBC 4.0 - 10.5 K/uL 7.4 8.8 6.5  Hemoglobin 13.0 - 17.0 g/dL 14.2 14.0 14.1  Hematocrit 39.0 - 52.0 % 43.1  41.0 43.1  Platelets 150 - 400 K/uL 207 195.0 205    No results found for: VD25OH  Clinical ASCVD: No  The 10-year ASCVD risk score (Arnett DK, et al., 2019) is: 23.9%   Values used to calculate the score:     Age: 36 years     Sex: Male     Is Non-Hispanic African American: No     Diabetic: No     Tobacco smoker: No     Systolic Blood Pressure: 601 mmHg     Is BP treated: Yes     HDL Cholesterol: 51.5 mg/dL     Total Cholesterol: 148 mg/dL    Depression screen [facility] 2/9 01/28/2021 02/13/2020 01/25/2018  Decreased Interest 0 0 0  Down, Depressed, Hopeless 0 0 0  PHQ - 2 Score 0 0 0      Social History   Tobacco Use  Smoking Status Never  Smokeless Tobacco Never   BP Readings from Last 3 Encounters:  01/28/21 122/62  12/25/20 (!) 148/70  10/29/20 122/62   Pulse Readings from Last 3 Encounters:  01/28/21 66  12/25/20 64  10/29/20 74   Wt Readings from Last 3 Encounters:  01/28/21 241 lb (109.3 kg)  12/25/20 239 lb 12.8 oz (108.8 kg)  10/29/20 236 lb (107 kg)   BMI Readings from Last 3 Encounters:  01/28/21 35.59 kg/m  12/25/20 35.41 kg/m  10/29/20 32.92 kg/m    Assessment/Interventions: Review of patient past medical history, allergies, medications, health status, including review of consultants reports, laboratory and other test data, was performed as part of comprehensive evaluation and provision of chronic care management services.   SDOH:  (Social Determinants of Health) assessments and interventions performed: No  SDOH Screenings   Alcohol Screen: Not on file  Depression (PHQ2-9): Low Risk    PHQ-2 Score: 0  Financial Resource Strain: Low Risk    Difficulty of Paying Living Expenses: Not hard at all  Food Insecurity: No Food Insecurity   Worried About Charity fundraiser in the Last Year: Never true   Ran Out of Food in the Last Year: Never true  Housing: Low Risk    Last Housing Risk Score: 0  Physical Activity: Sufficiently Active   Days of  Exercise per Week: 7 days   Minutes of Exercise per Session: 30 min  Social Connections: Moderately Isolated   Frequency of Communication with Friends and Family: More than three times a week   Frequency of Social Gatherings with Friends and Family: More than three times a week   Attends Religious Services: Never   Marine scientist or Organizations: Yes   Attends Music therapist: More than 4 times per year   Marital Status: Divorced  Stress: No Stress Concern Present   Feeling of Stress : Not at all  Tobacco Use: Low Risk    Smoking Tobacco Use: Never   Smokeless Tobacco Use: Never   Passive Exposure: Not  on file  Transportation Needs: No Transportation Needs   Lack of Transportation (Medical): No   Lack of Transportation (Non-Medical): No    CCM Care Plan  Allergies  Allergen Reactions   Lisinopril Cough    Medications Reviewed Today     Reviewed by Criselda Peaches, LPN (Licensed Practical Nurse) on 01/28/21 at [phone]  Med List Status: <None>   Medication Order Taking? Sig Documenting Provider Last Dose Status Informant  cyclobenzaprine (FLEXERIL) 10 MG tablet [id number] No Take 1 tablet (10 mg total) by mouth 3 (three) times daily as needed for muscle spasms.  Patient not taking: Reported on 12/25/2020   [name], NP Not Taking Active Self  EPINEPHrine 0.3 mg/0.3 mL IJ SOAJ injection [id number] No Inject 0.3 mLs (0.3 mg total) into the muscle once as needed for up to 1 dose for anaphylaxis. Bobbitt, Sedalia Muta, MD Taking Active Self  hydrochlorothiazide (HYDRODIURIL) 25 MG tablet [id number] No Take 1 tablet (25 mg total) by mouth daily. Nafziger, [name], NP Taking Active   losartan (COZAAR) 100 MG tablet [id number] No Take 1 tablet (100 mg total) by mouth daily. Nafziger, [name], NP Taking Active   montelukast (SINGULAIR) 10 MG tablet [id number] No TAKE 1 TABLET BY MOUTH AT BEDTIME  Patient taking differently: Take 10 mg by mouth at bedtime.   Garnet Sierras,  DO Taking Active Self  Multiple Vitamins-Minerals (PRESERVISION AREDS 2+MULTI VIT PO) [id number] No Take 1 capsule by mouth in the morning and at bedtime. [provider] Taking Active Self  nirmatrelvir & ritonavir (PAXLOVID, 300/100,) 20 x 150 MG & 10 x 100MG TBPK [id number]  Take 3 tablets by mouth in the morning and at bedtime. Take 300 mg nirmatrelvir (two 150 mg tablets) with 100 mg ritonavir (one 100 mg tablet) twice a day. Garnet Sierras, DO  Active   omeprazole (PRILOSEC) 20 MG capsule [id number] No Take 1 capsule (20 mg total) by mouth daily as needed (reflux). Garnet Sierras, DO Taking Active Self  Propylene Glycol 0.6 % SOLN [id number] No Place 1 drop into both eyes 2 (two) times daily. BID  Patient not taking: Reported on 12/25/2020   [provider] Not Taking Active Self  rosuvastatin (CRESTOR) 5 MG tablet [id number] No Take 1 tablet (5 mg total) by mouth every other day. Nafziger, [name], NP Taking Active Self  Turmeric (QC TUMERIC COMPLEX PO) [id number] No Take 1,000 mg by mouth in the morning and at bedtime. [provider] Taking Active Self            Patient Active Problem List   Diagnosis Date Noted   GERD (gastroesophageal reflux disease) 07/24/2017   Perennial and seasonal allergic rhinitis 03/13/2017   Lung field abnormal finding on examination 08/31/2015   Essential hypertension 08/31/2015    Immunization History  Administered Date(s) Administered   Fluad Quad(high Dose 65+) 02/13/2020   Influenza, High Dose Seasonal PF 11/03/2017   MODERNA COVID-19 SARS-COV-2 PEDS BIVALENT BOOSTER 6Y-11Y 12/21/2019   Moderna SARS-COV2 Booster Vaccination 12/21/2019   Moderna Sars-Covid-2 Vaccination 02/22/2019, 03/22/2019   Pneumococcal Conjugate-13 03/16/2016   Pneumococcal Polysaccharide-23 01/25/2018   Patient reports his biggest concern with his health right now is his blood pressure fluctuating a lot more than usual. Patient has been checking his BP about  3 times a week. He usually eats breakfast and then checks around 7:30 and then waits 1 hour after BP medications to check BP. He reports his readings are similar to the pharmacy BP  cuff. He started drinking decaf coffee and coke. He is drinking a lot of water. He eats out breakfast often and then lunch out 3 days a week and eating dinner out once a week.  He has been taking Celebrex every other day.  Conditions to be addressed/monitored:  Hypertension, Hyperlipidemia, GERD, Osteoarthritis and Allergic Rhinitis  Conditions addressed this visit: Hypertension, hyperlipidemia, osteoarthritis  Care Plan : CCM Pharmacy Care Plan  Updates made by [name], [place] since 02/22/2021 12:00 AM     Problem: Problem: Hypertension, Hyperlipidemia, GERD, Osteoarthritis and Allergic Rhinitis      Long-Range Goal: Patient-Specific Goal   Start Date: 05/13/2020  Expected End Date: 05/13/2021  Recent Progress: On track  Priority: High  Note:   Current Barriers:  Unable to independently monitor therapeutic efficacy Unable to maintain control of blood pressure  Pharmacist Clinical Goal(s):  Patient will achieve control of cholesterol as evidenced by next lipid panel  through collaboration with PharmD and provider.   Interventions: 1:1 collaboration with [name], NP regarding development and update of comprehensive plan of care as evidenced by provider attestation and co-signature Inter-disciplinary care team collaboration (see longitudinal plan of care) Comprehensive medication review performed; medication list updated in electronic medical record  Hypertension (BP goal <140/90) -Not ideally controlled -Current treatment: Losartan 100 mg 1 tablet daily - in AM - Appropriate, Query effective, Safe, Accessible Hydrochlorothiazide 25 mg 1 tablet daily - in AM - Appropriate, Query effective, Safe, Accessible -Medications previously tried: lisinopril (cough) -Current home readings: 177/86 pulse  63, 158/82, 121/66 pulse 62, 138/74, 128/67 141/73, 153/77 (3 times a week); using an arm cuff and it is newer -Current dietary habits: doesn't add salt to food; using a lower sodium option; eating out frequently; not eating out as much -Current exercise habits: walking less but is using a stationary bike 3 to 5 days a week (5 minutes 3 times a day) -Denies hypotensive/hypertensive symptoms -Educated on Exercise goal of 150 minutes per week; Importance of home blood pressure monitoring; Proper BP monitoring technique; -Counseled to monitor BP at home weekly, document, and provide log at future appointments -Counseled on diet and exercise extensively Recommended to continue current medication  Hyperlipidemia: (LDL goal < 100) -Controlled -Current treatment: Rosuvastatin 5 mg every other day - Appropriate, Effective, Safe, Accessible -Medications previously tried: simvastatin (never started)  -Current dietary patterns: eating out frequently; cut back on beer -Current exercise habits: walking less but is using a stationary bike 3 to 5 days a week (5 minutes 3 times a day) -Educated on Cholesterol goals;  Importance of limiting foods high in cholesterol; Exercise goal of 150 minutes per week; -Counseled on diet and exercise extensively Recommended to continue current medication  GERD (Goal: minimize symptoms) -Not ideally controlled -Current treatment  Omeprazole 20 mg 1 capsule daily as needed - Appropriate, Effective, Safe, Accessible -Medications previously tried: none  - Patient has not noted a change in cough since starting  Allergic rhinitis (Goal: minimize symptoms) -Controlled -Current treatment  Flonase 50 mcg/act nasal spray 1 spray in the morning and at bedtime as needed - Appropriate, Effective, Safe, Accessible Budesonide 0.5 mg/80m rinse twice daily - Appropriate, Effective, Safe, Accessible Ipratropium 0.03% nasal spray 1 spray in each nostril in the morning and bedtime  (not using lately) - Appropriate, Effective, Safe, Accessible Montelukast 10 mg 1 tablet daily at bedtime - Appropriate, Effective, Safe, Accessible - not taking Allegra 160 mg 1 tablet as needed (taking it every day for 5-6  days) - Appropriate, Effective, Safe, Accessible -Medications previously tried: none  -Recommended to continue current medication  Osteoarthritis (Goal: minimize pain) -Controlled -Current treatment  Celebrex 200 mg 1 tablet every other day - Appropriate, Effective, Query Safe, Accessible Turmeric 1000 mg 1 tablet daily -Medications previously tried: Advil -Patient played football for several years and thinks this is from this; discussed preference of Tylenol over NSAIDs and recommended trial of diclofenac gel Patient is only having pain with his foot and bone spurs right now.  Health Maintenance -Vaccine gaps: shingles, tetanus -Current therapy:  Pataday 0.2% 1 drop in both eyes Multivitamin 1 tablet daily Preservision Areds 2 1 tablet twice daily SootheXP eye drops 1-2 times a day Eye gel daily -Educated on Cost vs benefit of each product must be carefully weighed by individual consumer -Patient is satisfied with current therapy and denies issues -Recommended to continue current medication  Patient Goals/Self-Care Activities Patient will:  - take medications as prescribed check blood pressure weekly, document, and provide at future appointments target a minimum of 150 minutes of moderate intensity exercise weekly  Follow Up Plan: The care management team will reach out to the patient again over the next 30 days.         Medication Assistance: None required.  Patient affirms current coverage meets needs.  Compliance/Adherence/Medication fill history: Care Gaps: COVID booster, tetanus, shingrix BP- 134/73 (01/08/21)    Star-Rating Drugs: Rosuvastatin 8m - Last filled 11/23/2020 90DS at [facility] Losartan 1059m- [facility]  [id number]: [phone]: [phone] Uses pill box? No - sitting in the cabinet - all in different cabinets for different times of day Pt endorses 100% compliance  We discussed: Current pharmacy is preferred with insurance plan and patient is satisfied with pharmacy services Patient decided to: Continue current medication management strategy  Care Plan and Follow Up Patient Decision:  Patient agrees to Care Plan and Follow-up.  Plan: The care management team will reach out to the patient again over the next 30 days.  MaJeni SallesPharmD BCWyckoff Heights Medical Centerlinical Pharmacist [place]

## 2021-02-22 NOTE — Patient Instructions (Signed)
Hi Marcus Gomez,  It was great to catch up with you again! Don't forget to move your losartan to the evening with your other medications and try to wait 10-15 minutes after sitting down to check your blood pressure in the mornings. It may be helpful to average a couple of numbers as well.  Please reach out to me if you have any questions or need anything!  Best, Maddie  [name], PharmD, [place] Pharmacist [place] [facility] [place]   Visit Information   Goals Addressed   None    Patient Care Plan: CCM Pharmacy Care Plan     Problem Identified: Problem: Hypertension, Hyperlipidemia, GERD, Osteoarthritis and Allergic Rhinitis      Long-Range Goal: Patient-Specific Goal   Start Date: 05/13/2020  Expected End Date: 05/13/2021  Recent Progress: On track  Priority: High  Note:   Current Barriers:  Unable to independently monitor therapeutic efficacy Unable to maintain control of blood pressure  Pharmacist Clinical Goal(s):  Patient will achieve control of cholesterol as evidenced by next lipid panel  through collaboration with PharmD and provider.   Interventions: 1:1 collaboration with [name], NP regarding development and update of comprehensive plan of care as evidenced by provider attestation and co-signature Inter-disciplinary care team collaboration (see longitudinal plan of care) Comprehensive medication review performed; medication list updated in electronic medical record  Hypertension (BP goal <140/90) -Not ideally controlled -Current treatment: Losartan 100 mg 1 tablet daily - in AM - Appropriate, Query effective, Safe, Accessible Hydrochlorothiazide 25 mg 1 tablet daily - in AM - Appropriate, Query effective, Safe, Accessible -Medications previously tried: lisinopril (cough) -Current home readings: 177/86 pulse 63, 158/82, 121/66 pulse 62, 138/74, 128/67 141/73, 153/77 (3 times a week); using an arm cuff and it is newer -Current  dietary habits: doesn't add salt to food; using a lower sodium option; eating out frequently; not eating out as much -Current exercise habits: walking less but is using a stationary bike 3 to 5 days a week (5 minutes 3 times a day) -Denies hypotensive/hypertensive symptoms -Educated on Exercise goal of 150 minutes per week; Importance of home blood pressure monitoring; Proper BP monitoring technique; -Counseled to monitor BP at home weekly, document, and provide log at future appointments -Counseled on diet and exercise extensively Recommended to continue current medication  Hyperlipidemia: (LDL goal < 100) -Controlled -Current treatment: Rosuvastatin 5 mg every other day - Appropriate, Effective, Safe, Accessible -Medications previously tried: simvastatin (never started)  -Current dietary patterns: eating out frequently; cut back on beer -Current exercise habits: walking less but is using a stationary bike 3 to 5 days a week (5 minutes 3 times a day) -Educated on Cholesterol goals;  Importance of limiting foods high in cholesterol; Exercise goal of 150 minutes per week; -Counseled on diet and exercise extensively Recommended to continue current medication  GERD (Goal: minimize symptoms) -Not ideally controlled -Current treatment  Omeprazole 20 mg 1 capsule daily as needed - Appropriate, Effective, Safe, Accessible -Medications previously tried: none  - Patient has not noted a change in cough since starting  Allergic rhinitis (Goal: minimize symptoms) -Controlled -Current treatment  Flonase 50 mcg/act nasal spray 1 spray in the morning and at bedtime as needed - Appropriate, Effective, Safe, Accessible Budesonide 0.5 mg/81ml rinse twice daily - Appropriate, Effective, Safe, Accessible Ipratropium 0.03% nasal spray 1 spray in each nostril in the morning and bedtime (not using lately) - Appropriate, Effective, Safe, Accessible Montelukast 10 mg 1 tablet daily at bedtime -  Appropriate, Effective, Safe, Accessible - not taking Allegra 160 mg 1 tablet as needed (taking it every day for 5-6 days) - Appropriate, Effective, Safe, Accessible -Medications previously tried: none  -Recommended to continue current medication  Osteoarthritis (Goal: minimize pain) -Controlled -Current treatment  Celebrex 200 mg 1 tablet every other day - Appropriate, Effective, Query Safe, Accessible Turmeric 1000 mg 1 tablet daily -Medications previously tried: Advil -Patient played football for several years and thinks this is from this; discussed preference of Tylenol over NSAIDs and recommended trial of diclofenac gel Patient is only having pain with his foot and bone spurs right now.  Health Maintenance -Vaccine gaps: shingles, tetanus -Current therapy:  Pataday 0.2% 1 drop in both eyes Multivitamin 1 tablet daily Preservision Areds 2 1 tablet twice daily SootheXP eye drops 1-2 times a day Eye gel daily -Educated on Cost vs benefit of each product must be carefully weighed by individual consumer -Patient is satisfied with current therapy and denies issues -Recommended to continue current medication  Patient Goals/Self-Care Activities Patient will:  - take medications as prescribed check blood pressure weekly, document, and provide at future appointments target a minimum of 150 minutes of moderate intensity exercise weekly  Follow Up Plan: The care management team will reach out to the patient again over the next 30 days.        Patient verbalizes understanding of instructions and care plan provided today and agrees to view in [place]. Active MyChart status confirmed with patient.   The pharmacy team will reach out to the patient again over the next 30 days.   [name], [facility]

## 2021-03-01 ENCOUNTER — Ambulatory Visit (INDEPENDENT_AMBULATORY_CARE_PROVIDER_SITE_OTHER): Payer: PPO

## 2021-03-01 DIAGNOSIS — J309 Allergic rhinitis, unspecified: Secondary | ICD-10-CM

## 2021-03-02 NOTE — Progress Notes (Signed)
VIALS EXP 03-02-22

## 2021-03-03 DIAGNOSIS — J3089 Other allergic rhinitis: Secondary | ICD-10-CM | POA: Diagnosis not present

## 2021-03-04 DIAGNOSIS — J3081 Allergic rhinitis due to animal (cat) (dog) hair and dander: Secondary | ICD-10-CM | POA: Diagnosis not present

## 2021-03-04 DIAGNOSIS — M62838 Other muscle spasm: Secondary | ICD-10-CM | POA: Diagnosis not present

## 2021-03-05 DIAGNOSIS — I10 Essential (primary) hypertension: Secondary | ICD-10-CM | POA: Diagnosis not present

## 2021-03-05 DIAGNOSIS — M47812 Spondylosis without myelopathy or radiculopathy, cervical region: Secondary | ICD-10-CM | POA: Diagnosis not present

## 2021-03-09 DIAGNOSIS — E782 Mixed hyperlipidemia: Secondary | ICD-10-CM | POA: Diagnosis not present

## 2021-03-09 DIAGNOSIS — I10 Essential (primary) hypertension: Secondary | ICD-10-CM | POA: Diagnosis not present

## 2021-03-22 ENCOUNTER — Telehealth: Payer: Self-pay | Admitting: Pharmacist

## 2021-03-22 NOTE — Chronic Care Management (AMB) (Signed)
? ? ?Chronic Care Management ?Pharmacy Assistant  ? ?Name: Marcus Gomez.  MRN: [id number] DOB: 08-23-1945 ? ?Reason for Encounter: Disease State Hypertension Assessment ?  ?Conditions to be addressed/monitored: ?HTN ? ?Recent office visits:  ?None ? ?Recent consult visits:  ?None ? ?Hospital visits:  ?Medication Reconciliation was completed by comparing discharge summary, patient?s EMR and Pharmacy list, and upon discussion with patient. ?  ?Patient presented to [facility] ED on 10/22/20 due to Dizziness and Hypertension. Patient was present for 9 hours. ?  ?New?Medications Started at [facility] Discharge:?? ?-started  ?hydrochlorothiazide 25 MG tablet ?  ?Medication Changes at Hospital Discharge: ?-Changed  ?None ?  ?Medications Discontinued at Hospital Discharge: ?-Stopped  ?None ?  ?Medications that remain the same after Hospital Discharge:??  ?-All other medications will remain the same.   ?  ? ?Medications: ?Outpatient Encounter Medications as of 03/22/2021  ?Medication Sig  ? cyclobenzaprine (FLEXERIL) 10 MG tablet Take 1 tablet (10 mg total) by mouth 3 (three) times daily as needed for muscle spasms. (Patient not taking: Reported on 12/25/2020)  ? EPINEPHrine 0.3 mg/0.3 mL IJ SOAJ injection Inject 0.3 mLs (0.3 mg total) into the muscle once as needed for up to 1 dose for anaphylaxis.  ? hydrochlorothiazide (HYDRODIURIL) 25 MG tablet Take 1 tablet (25 mg total) by mouth daily.  ? losartan (COZAAR) 100 MG tablet Take 1 tablet (100 mg total) by mouth daily.  ? montelukast (SINGULAIR) 10 MG tablet TAKE 1 TABLET BY MOUTH AT BEDTIME (Patient taking differently: Take 10 mg by mouth at bedtime.)  ? Multiple Vitamins-Minerals (PRESERVISION AREDS 2+MULTI VIT PO) Take 1 capsule by mouth in the morning and at bedtime.  ? omeprazole (PRILOSEC) 20 MG capsule Take 1 capsule (20 mg total) by mouth daily as needed (reflux).  ? Propylene Glycol 0.6 % SOLN Place 1 drop into both eyes 2 (two) times  daily. BID (Patient not taking: Reported on 12/25/2020)  ? rosuvastatin (CRESTOR) 5 MG tablet Take 1 tablet (5 mg total) by mouth every other day.  ? Turmeric (QC TUMERIC COMPLEX PO) Take 1,000 mg by mouth in the morning and at bedtime.  ? ?No facility-administered encounter medications on file as of 03/22/2021.  ?Reviewed chart prior to disease state call. Spoke with patient regarding BP ? ?Recent Office Vitals: ?BP Readings from Last 3 Encounters:  ?01/28/21 122/62  ?12/25/20 (!) 148/70  ?10/29/20 122/62  ? ?Pulse Readings from Last 3 Encounters:  ?01/28/21 66  ?12/25/20 64  ?10/29/20 74  ?  ?Wt Readings from Last 3 Encounters:  ?01/28/21 241 lb (109.3 kg)  ?12/25/20 239 lb 12.8 oz (108.8 kg)  ?10/29/20 236 lb (107 kg)  ?  ? ?Kidney Function ?Lab Results  ?Component Value Date/Time  ? CREATININE 0.97 10/29/2020 08:57 AM  ? CREATININE 0.77 10/22/2020 06:02 PM  ? CREATININE 0.88 07/24/2019 08:23 AM  ? GFR 76.29 10/29/2020 08:57 AM  ? GFRNONAA >60 10/22/2020 06:02 PM  ? GFRAA 85 (L) 12/09/2012 01:15 PM  ? ? ?BMP Latest Ref Rng & Units 10/29/2020 10/22/2020 07/24/2020  ?Glucose 70 - 99 mg/dL 94 96 108(H)  ?BUN 6 - 23 mg/dL '18 12 10  '$ ?Creatinine 0.40 - 1.50 mg/dL 0.97 0.77 0.91  ?BUN/Creat Ratio 6 - 22 (calc) - - -  ?Sodium 135 - 145 mEq/L 133(L) 138 137  ?Potassium 3.5 - 5.1 mEq/L 4.1 3.6 4.5  ?Chloride 96 - 112 mEq/L 102 106 102  ?CO2 19 - 32 mEq/L 23 23  26  ?Calcium 8.4 - 10.5 mg/dL 9.3 9.5 9.5  ? ? ?Current antihypertensive regimen:  ?Losartan 100 mg 1 tablet daily - in AM  ?HCTZ - 25 mg ?How often are you checking your Blood Pressure? 3-5x per week ?Current home BP readings: 153/82, 130/64, 139/77, 124/66 ?Patient denies any hypo/hypertensive symptoms he reports he had a headache last weekend but thinks that was due to his sinus/allergies. ?What recent interventions/DTPs have been made by any provider to improve Blood Pressure control since last CPP Visit: Patient reports none ?Any recent hospitalizations or ED  visits since last visit with CPP? No ?What diet changes have been made to improve Blood Pressure Control?  ?Patient reports meal patterns unchanged ?What exercise is being done to improve your Blood Pressure Control?  ?Patient reports he is using his stationary bike but has bone spurs in his heels so stops when it bothers him ? ?Adherence Review: ?Is the patient currently on ACE/ARB medication? Yes ?Does the patient have >5 day gap between last estimated fill dates? No ? ? ? ? ?Care Gaps: ?BP- 124/66( home) ?AWV- 1/23 ? ?Star Rating Drugs: ?Losartan 100 mg - Last filled 02/05/21 90 DS at [facility] ?Rosuvastatin 5 mg - Last filled 11/23/20 90 DS at [facility] ?Verified as accurate ? ?Ned Clines CMA ?Clinical Pharmacist Assistant ?[phone] ? ?

## 2021-03-23 ENCOUNTER — Ambulatory Visit (INDEPENDENT_AMBULATORY_CARE_PROVIDER_SITE_OTHER): Payer: PPO

## 2021-03-23 DIAGNOSIS — J309 Allergic rhinitis, unspecified: Secondary | ICD-10-CM | POA: Diagnosis not present

## 2021-04-09 ENCOUNTER — Other Ambulatory Visit: Payer: Self-pay | Admitting: Allergy

## 2021-04-12 DIAGNOSIS — H10013 Acute follicular conjunctivitis, bilateral: Secondary | ICD-10-CM | POA: Diagnosis not present

## 2021-04-12 DIAGNOSIS — S0501XA Injury of conjunctiva and corneal abrasion without foreign body, right eye, initial encounter: Secondary | ICD-10-CM | POA: Diagnosis not present

## 2021-04-13 DIAGNOSIS — H16141 Punctate keratitis, right eye: Secondary | ICD-10-CM | POA: Diagnosis not present

## 2021-04-23 ENCOUNTER — Ambulatory Visit (HOSPITAL_COMMUNITY)
Admission: RE | Admit: 2021-04-23 | Discharge: 2021-04-23 | Disposition: A | Discharge status: Home / Self Care | Payer: PPO | Source: Ambulatory Visit | Attending: Physician Assistant | Admitting: Physician Assistant

## 2021-04-23 ENCOUNTER — Telehealth: Payer: Self-pay | Admitting: Pharmacist

## 2021-04-23 ENCOUNTER — Ambulatory Visit (INDEPENDENT_AMBULATORY_CARE_PROVIDER_SITE_OTHER): Payer: PPO | Admitting: Physician Assistant

## 2021-04-23 VITALS — BP 117/73 | HR 76 | Temp 97.9°F | Ht 69.0 in | Wt 231.6 lb

## 2021-04-23 DIAGNOSIS — R519 Headache, unspecified: Secondary | ICD-10-CM | POA: Diagnosis not present

## 2021-04-23 DIAGNOSIS — R42 Dizziness and giddiness: Secondary | ICD-10-CM

## 2021-04-23 DIAGNOSIS — R110 Nausea: Secondary | ICD-10-CM | POA: Diagnosis not present

## 2021-04-23 MED ORDER — ONDANSETRON HCL 8 MG PO TABS: 8.0000 mg | ORAL_TABLET | Freq: Three times a day (TID) | ORAL | 0 refills | Status: DC | PRN

## 2021-04-23 NOTE — Progress Notes (Signed)
? ?Subjective:  ? ? Patient ID: Marcus Gomez., male    DOB: 05-09-1945, 76 y.o.   MRN: [id number] ? ?Chief Complaint  ?Patient presents with  ? Dizziness  ?  Pt stated that he has been experiencing some dizzy spells for the past week. The spells has been on going and also has an upset stomach and can't eat and this also has been going on for the past week.  ? ? ?Dizziness ? ?Patient is in today for dizziness x 1 week. Drove himself to office today. Nobody with him this morning.  ? ?"Head feels like when you have the flu." ?"Queasy stomach." Ate 1/2 bowl of chicken noodle soup yesterday, otherwise can't eat anything. ? ?Started getting dizzy last week one morning when he got up, grabbing onto furniture, couldn't stand up straight. [facility]. He was given meclizine and a prednisone pak. Seemed to help, still having some dizziness, but better. Feels like he's spinning when he turns over in the bed.  ? ?Not lightheaded, doesn't feel like passing out.  ? ?Sore spot to the back of the head x 7-10 days. 2/10, steady pain, sometimes worse. Yesterday had "flash of light in left eye", but this resolved on its own.  ? ?No abdominal pain. Nausea, not letting up. No vomiting. No diarrhea, but not having regular BM's right now. Abdomen felt hard at one time but this went away on its own this morning.  ? ?Pepto-Bismal not helping. ? ?Currently has hiccups. Heartburn with trying to take BP medications. No CP or SOB. No weakness.  ? ?Past Medical History:  ?Diagnosis Date  ? Essential hypertension   ? Prostate cancer ([place])   ? Remission  ? ? ?Past Surgical History:  ?Procedure Laterality Date  ? KNEE SURGERY Bilateral 2015 & 2014  ? SHOULDER SURGERY Right 2016  ? ? ?Family History  ?Problem Relation Age of Onset  ? Stroke Mother   ? Allergies Father   ? COPD Father   ?     never smoker  ? Rheum arthritis Father   ? Allergic rhinitis Neg Hx   ? Angioedema Neg Hx   ? Asthma Neg Hx   ? Eczema  Neg Hx   ? Immunodeficiency Neg Hx   ? Urticaria Neg Hx   ? ? ?Social History  ? ?Tobacco Use  ? Smoking status: Never  ? Smokeless tobacco: Never  ?Vaping Use  ? Vaping Use: Never used  ?Substance Use Topics  ? Alcohol use: Yes  ?  Comment: socially   ? Drug use: No  ?  ? ?Allergies  ?Allergen Reactions  ? Lisinopril Cough  ? ? ?Review of Systems  ?Neurological:  Positive for dizziness.  ?NEGATIVE UNLESS OTHERWISE INDICATED IN HPI ? ? ?   ?Objective:  ?  ? ?BP 117/73   Pulse 76   Temp 97.9 ?F (36.6 ?C)   Ht '5\' 9"'$  (1.753 m)   Wt 231 lb 9.6 oz (105.1 kg)   SpO2 100%   BMI 34.20 kg/m?  ? ?Wt Readings from Last 3 Encounters:  ?04/23/21 231 lb 9.6 oz (105.1 kg)  ?01/28/21 241 lb (109.3 kg)  ?12/25/20 239 lb 12.8 oz (108.8 kg)  ? ? ?BP Readings from Last 3 Encounters:  ?04/23/21 117/73  ?01/28/21 122/62  ?12/25/20 (!) 148/70  ?  ? ?Physical Exam ?Vitals and nursing note reviewed.  ?Constitutional:   ?   General: He is not in  acute distress. ?   Appearance: Normal appearance. He is not toxic-appearing.  ?HENT:  ?   Head: Normocephalic and atraumatic.  ? ?   Right Ear: Tympanic membrane, ear canal and external ear normal.  ?   Left Ear: Tympanic membrane, ear canal and external ear normal.  ?   Nose: Nose normal.  ?   Mouth/Throat:  ?   Mouth: Mucous membranes are moist.  ?   Pharynx: Oropharynx is clear.  ?Eyes:  ?   Extraocular Movements: Extraocular movements intact.  ?   Conjunctiva/sclera: Conjunctivae normal.  ?   Pupils: Pupils are equal, round, and reactive to light.  ?Cardiovascular:  ?   Rate and Rhythm: Normal rate and regular rhythm.  ?   Pulses: Normal pulses.  ?   Heart sounds: Normal heart sounds.  ?Pulmonary:  ?   Effort: Pulmonary effort is normal.  ?   Breath sounds: Normal breath sounds.  ?Abdominal:  ?   General: Abdomen is flat. Bowel sounds are normal.  ?   Palpations: Abdomen is soft.  ?   Tenderness: There is no abdominal tenderness. There is no right CVA tenderness or left CVA tenderness.   ?Musculoskeletal:     ?   General: Normal range of motion.  ?   Cervical back: Normal range of motion and neck supple.  ?Skin: ?   General: Skin is warm and dry.  ?Neurological:  ?   General: No focal deficit present.  ?   Mental Status: He is alert and oriented to person, place, and time.  ?   Cranial Nerves: No cranial nerve deficit.  ?   Sensory: No sensory deficit.  ?   Motor: No weakness.  ?   Coordination: Romberg sign negative. Coordination normal. Finger-Nose-Finger Test normal. Rapid alternating movements normal.  ?   Gait: Gait normal.  ?Psychiatric:     ?   Mood and Affect: Mood normal.     ?   Behavior: Behavior normal.  ? ? ?   ?Assessment & Plan:  ? ?Problem List Items Addressed This Visit   ?None ?Visit Diagnoses   ? ? Dizziness    -  Primary  ? Relevant Orders  ? CT HEAD WO CONTRAST (5MM)  ? C-reactive protein  ? Sedimentation rate  ? Occipital headache      ? Relevant Orders  ? CT HEAD WO CONTRAST (5MM)  ? C-reactive protein  ? Sedimentation rate  ? Nausea      ? Relevant Orders  ? CT HEAD WO CONTRAST (5MM)  ? C-reactive protein  ? Sedimentation rate  ? ?  ? ? ?1. Dizziness ?2. Occipital headache ?3. Nausea ?Discussed case with Dr. Cherlynn Kaiser today ?Patient treated for benign vertigo 6 days ago at [facility].  Meclizine does seem to be helping.  Still having unusual occipital headache given the patient's age and mother's history of stroke, not unreasonable to get a stat CT head and make sure nothing else is going on that could be causing his symptoms.  Thankfully neurologic exam is normal he has no focal findings.  Could be unusual viral syndrome that is going to take some time to resolve.  I feel like he would feel better if he could keep some food down as well.  Trial Zofran 8 mg every 6-8 hours as needed for nausea.  Continue fluids.  Check CRP and sed rate today, although patient does not have tenderness along temporal arteries.  ER precautions discussed with  patient.  Plan to follow-up with him  after results. ? ? ? ?This note was prepared with assistance of Systems analyst. Occasional wrong-word or sound-a-like substitutions may have occurred due to the inherent limitations of voice recognition software. ? ?Time Spent: ?35 minutes of total time was spent on the date of the encounter performing the following actions: chart review prior to seeing the patient, obtaining history, performing a medically necessary exam, counseling on the treatment plan, placing orders, and documenting in our EHR.   ? ?Nilda Keathley M Emmalou Hunger, PA-C ?

## 2021-04-23 NOTE — Patient Instructions (Addendum)
CT head ?Sed rate, CRP ? ?If acute worsening symptoms - go straight to ER ? ?Zofran as needed for nausea. Keep hydrated. Call with updates.  ?

## 2021-04-23 NOTE — Chronic Care Management (AMB) (Signed)
? ? ?Chronic Care Management ?Pharmacy Assistant  ? ?Name: Marcus Gomez.  MRN: [id number] DOB: [birth year] ? ?Reason for Encounter: Disease State ?  ?Conditions to be addressed/monitored: ?HTN ? ?Recent office visits:  ?None ? ?Recent consult visits:  ?04/28/21 Garnet Sierras, DO (Allergy) - Patient presented for Perennial and seasonal allergic rhinitis and other concerns. Prescribed Olopatadine. Changed Montelukast. Increased Omeprazole. Stopped Cyclobenzaprine. Stopped Zofran. ? ?04/23/21 Allwardt, Randa Evens, PA-C - Patient presented for Dizziness and other concerns. Prescribed Zofran ? ?03/23/21 Larence Penning, CMA (Allergist) - Patient presented for Allergy injections. No other visit details available. ? ? ?[facility] visits:  ?Medication Reconciliation was completed by comparing discharge summary, patient?s EMR and Pharmacy list, and upon discussion with patient. ?  ?Patient presented to [facility] ED on 04/26/21 due to abdominal pain. Patient was present for 13 hours. ?  ?New?Medications Started at [place] [facility] Discharge:?? ?-started  ?Sucralfate ?  ?Medication Changes at [facility] Discharge: ?-Changed  ?Omeprazole ?  ?Medications Discontinued at [facility] Discharge: ?-Stopped  ?None ?  ?Medications that remain the same after [facility] Discharge:??  ?-All other medications will remain the same.   ?  ? ?Medications: ?Outpatient Encounter Medications as of 04/23/2021  ?Medication Sig  ? cyclobenzaprine (FLEXERIL) 10 MG tablet Take 1 tablet (10 mg total) by mouth 3 (three) times daily as needed for muscle spasms.  ? EPINEPHrine 0.3 mg/0.3 mL IJ SOAJ injection Inject 0.3 mLs (0.3 mg total) into the muscle once as needed for up to 1 dose for anaphylaxis.  ? hydrochlorothiazide (HYDRODIURIL) 25 MG tablet Take 1 tablet (25 mg total) by mouth daily.  ? losartan (COZAAR) 100 MG tablet Take 1 tablet (100 mg total) by mouth daily.  ? montelukast (SINGULAIR) 10 MG tablet TAKE 1 TABLET BY MOUTH AT BEDTIME  (Patient taking differently: Take 10 mg by mouth at bedtime.)  ? Multiple Vitamins-Minerals (PRESERVISION AREDS 2+MULTI VIT PO) Take 1 capsule by mouth in the morning and at bedtime.  ? omeprazole (PRILOSEC) 20 MG capsule TAKE 1 CAPSULE BY MOUTH ONCE DAILY AS NEEDED FOR  REFLUX  ? ondansetron (ZOFRAN) 8 MG tablet Take 1 tablet (8 mg total) by mouth every 8 (eight) hours as needed for nausea or vomiting.  ? Propylene Glycol 0.6 % SOLN Place 1 drop into both eyes 2 (two) times daily. BID  ? rosuvastatin (CRESTOR) 5 MG tablet Take 1 tablet (5 mg total) by mouth every other day.  ? Turmeric (QC TUMERIC COMPLEX PO) Take 1,000 mg by mouth in the morning and at bedtime.  ? ?No facility-administered encounter medications on file as of 04/23/2021.  ?Reviewed chart prior to disease state call. Spoke with patient regarding BP ? ?Recent Office Vitals: ?BP Readings from Last 3 Encounters:  ?04/23/21 117/73  ?01/28/21 122/62  ?12/25/20 (!) 148/70  ? ?Pulse Readings from Last 3 Encounters:  ?04/23/21 76  ?01/28/21 66  ?12/25/20 64  ?  ?Wt Readings from Last 3 Encounters:  ?04/23/21 231 lb 9.6 oz (105.1 kg)  ?01/28/21 241 lb (109.3 kg)  ?12/25/20 239 lb 12.8 oz (108.8 kg)  ?  ? ?Kidney Function ?Lab Results  ?Component Value Date/Time  ? CREATININE 0.97 10/29/2020 08:57 AM  ? CREATININE 0.77 10/22/2020 06:02 PM  ? CREATININE 0.88 07/24/2019 08:23 AM  ? GFR 76.29 10/29/2020 08:57 AM  ? GFRNONAA >60 10/22/2020 06:02 PM  ? GFRAA 85 (L) 12/09/2012 01:15 PM  ? ? ? ?  Latest Ref Rng & Units 10/29/2020  ?  8:57 AM 10/22/2020  ?  6:02 PM 07/24/2020  ?  9:06 AM  ?BMP  ?Glucose 70 - 99 mg/dL 94   96   108    ?BUN 6 - 23 mg/dL '18   12   10    '$ ?Creatinine 0.40 - 1.50 mg/dL 0.97   0.77   0.91    ?Sodium 135 - 145 mEq/L 133   138   137    ?Potassium 3.5 - 5.1 mEq/L 4.1   3.6   4.5    ?Chloride 96 - 112 mEq/L 102   106   102    ?CO2 19 - 32 mEq/L '23   23   26    '$ ?Calcium 8.4 - 10.5 mg/dL 9.3   9.5   9.5    ? ? ?Current antihypertensive regimen:   ?Losartan 100 mg 1 tablet daily - in AM  ?HCTZ - 25 mg ?Patient reports he has not been checking at home lately as he hasn't been feeling well and it was checked at ED and allergist on today. He reports his symptoms are better  but not resolved he denies any vomiting/ diarrhea he reports he feels lightheaded and nauseated but does not vomit. He reports started to experience this a few days ago su much so that he was holding on to walls to stand steady, he was prescribed Zofran and when he took it it gave him unbearable heartburn. ? ?He reports he has been advised to and has stopped taking his blood pressure medication as his readings have been on the lower end was 108/58 today at his allergist. ? ?Advised I would forward information and office may reach out to him to schedule ? ?Adherence Review: ?Is the patient currently on ACE/ARB medication? Yes ?Does the patient have >5 day gap between last estimated fill dates? No ? ? ? ?Care Gaps: ?BP- 108/58 ( 04/28/21) ?AWV- 2/23 ? ?Star Rating Drugs: ?Losartan 100 mg - Last filled 02/05/21 90 DS at [facility] ?Rosuvastatin 5 mg - Last filled 04/09/21 90 DS at [place] ? ? ?Marcus Gomez CMA ?Clinical Pharmacist Assistant ?[phone] ? ?

## 2021-04-26 ENCOUNTER — Emergency Department (HOSPITAL_COMMUNITY)
Admission: EM | Admit: 2021-04-26 | Discharge: 2021-04-27 | Disposition: A | Discharge status: Home / Self Care | Payer: PPO | Attending: Emergency Medicine | Admitting: Emergency Medicine

## 2021-04-26 ENCOUNTER — Encounter (HOSPITAL_COMMUNITY): Payer: Self-pay | Admitting: Emergency Medicine

## 2021-04-26 ENCOUNTER — Telehealth: Payer: Self-pay | Admitting: Adult Health

## 2021-04-26 ENCOUNTER — Other Ambulatory Visit: Payer: Self-pay

## 2021-04-26 ENCOUNTER — Emergency Department (HOSPITAL_COMMUNITY): Payer: PPO

## 2021-04-26 DIAGNOSIS — R112 Nausea with vomiting, unspecified: Secondary | ICD-10-CM | POA: Diagnosis not present

## 2021-04-26 DIAGNOSIS — R000 Tachycardia, unspecified: Secondary | ICD-10-CM | POA: Diagnosis not present

## 2021-04-26 DIAGNOSIS — R079 Chest pain, unspecified: Secondary | ICD-10-CM | POA: Diagnosis not present

## 2021-04-26 DIAGNOSIS — R109 Unspecified abdominal pain: Secondary | ICD-10-CM | POA: Diagnosis not present

## 2021-04-26 DIAGNOSIS — R319 Hematuria, unspecified: Secondary | ICD-10-CM | POA: Diagnosis not present

## 2021-04-26 DIAGNOSIS — R1084 Generalized abdominal pain: Secondary | ICD-10-CM | POA: Diagnosis not present

## 2021-04-26 DIAGNOSIS — R42 Dizziness and giddiness: Secondary | ICD-10-CM | POA: Diagnosis not present

## 2021-04-26 LAB — URINALYSIS, ROUTINE W REFLEX MICROSCOPIC
Bilirubin Urine: NEGATIVE
Glucose, UA: NEGATIVE mg/dL
Hgb urine dipstick: NEGATIVE
Ketones, ur: NEGATIVE mg/dL
Leukocytes,Ua: NEGATIVE
Nitrite: NEGATIVE
Protein, ur: NEGATIVE mg/dL
Specific Gravity, Urine: 1.015 (ref 1.005–1.030)
pH: 6 (ref 5.0–8.0)

## 2021-04-26 LAB — CBC
HCT: 40.4 % (ref 39.0–52.0)
Hemoglobin: 13.7 g/dL (ref 13.0–17.0)
MCH: 29.4 pg (ref 26.0–34.0)
MCHC: 33.9 g/dL (ref 30.0–36.0)
MCV: 86.7 fL (ref 80.0–100.0)
Platelets: 249 10*3/uL (ref 150–400)
RBC: 4.66 MIL/uL (ref 4.22–5.81)
RDW: 12.7 % (ref 11.5–15.5)
WBC: 11.4 10*3/uL — ABNORMAL HIGH (ref 4.0–10.5)
nRBC: 0 % (ref 0.0–0.2)

## 2021-04-26 LAB — TROPONIN I (HIGH SENSITIVITY)
Troponin I (High Sensitivity): 7 ng/L (ref <18)
Troponin I (High Sensitivity): 8 ng/L (ref <18)

## 2021-04-26 LAB — COMPREHENSIVE METABOLIC PANEL
ALT: 24 U/L (ref 0–44)
AST: 17 U/L (ref 15–41)
Albumin: 4 g/dL (ref 3.5–5.0)
Alkaline Phosphatase: 55 U/L (ref 38–126)
Anion gap: 8 (ref 5–15)
BUN: 18 mg/dL (ref 8–23)
CO2: 23 mmol/L (ref 22–32)
Calcium: 9.3 mg/dL (ref 8.9–10.3)
Chloride: 99 mmol/L (ref 98–111)
Creatinine, Ser: 1.33 mg/dL — ABNORMAL HIGH (ref 0.61–1.24)
GFR, Estimated: 55 mL/min — ABNORMAL LOW (ref >60)
Glucose, Bld: 119 mg/dL — ABNORMAL HIGH (ref 70–99)
Potassium: 4 mmol/L (ref 3.5–5.1)
Sodium: 130 mmol/L — ABNORMAL LOW (ref 135–145)
Total Bilirubin: 1.6 mg/dL — ABNORMAL HIGH (ref 0.3–1.2)
Total Protein: 6.8 g/dL (ref 6.5–8.1)

## 2021-04-26 LAB — LIPASE, BLOOD: Lipase: 60 U/L — ABNORMAL HIGH (ref 11–51)

## 2021-04-26 MED ORDER — LACTATED RINGERS IV BOLUS
1000.0000 mL | Freq: Once | INTRAVENOUS | Status: AC
  Administered 2021-04-26: 1000 mL via INTRAVENOUS

## 2021-04-26 NOTE — Telephone Encounter (Signed)
Called pt back and states he cannot not eat or drink anything. The heartburn pain is unbearable. Currently being triaged ?

## 2021-04-26 NOTE — ED Provider Notes (Signed)
?[place] DEPT ?[facility] Emergency Department ?Provider Note ?MRN:  [id number]  ?Arrival date & time: 04/27/21    ? ?Chief Complaint   ?Abdominal Pain ?  ?History of Present Illness   ?Marcus Eastridge. is a 76 y.o. year-old male presents to the ED with chief complaint of abdominal pain that primarily occurs after eating.  He states that he feels bloated and nauseated, but has only had one episode of vomiting.  He states that otherwise, his abdomen doesn't hurt.  He reports having had this symptoms for a few weeks.  States that he has been seen by his doctor and was told to have his heart checked out.  He was also seen recently and had head CT for dizziness.  He states that his BMs have been less frequent.  He also reports foul smelling urine and reports having some blood in the urine. ? ? ? ? ?Review of Systems  ?Pertinent review of systems noted in HPI.  ? ? ?Physical Exam  ? ?Vitals:  ? 04/27/21 0212 04/27/21 0220  ?BP:  (!) 151/72  ?Pulse: 72 70  ?Resp: 18 19  ?Temp:    ?SpO2: 100% 100%  ?  ?CONSTITUTIONAL:  nontoxic-appearing, NAD ?NEURO:  Alert and oriented x 3, CN 3-12 grossly intact ?EYES:  eyes equal and reactive ?ENT/NECK:  Supple, no stridor  ?CARDIO:  normal rate, regular rhythm, appears well-perfused  ?PULM:  No respiratory distress, CTAB ?GI/GU:  non-distended, no focal tenderness ?MSK/SPINE:  No gross deformities, no edema, moves all extremities  ?SKIN:  no rash, atraumatic ? ? ?*Additional and/or pertinent findings included in MDM below ? ?Diagnostic and Interventional Summary  ? ? EKG Interpretation ? ?Date/Time:  Monday April 26 2021 13:07:19 EDT ?Ventricular Rate:  62 ?PR Interval:  182 ?QRS Duration: 96 ?QT Interval:  396 ?QTC Calculation: 401 ?R Axis:   0 ?Text Interpretation: Normal sinus rhythm Incomplete right bundle branch block Borderline ECG When compared with ECG of 22-Oct-2020 18:09, PREVIOUS ECG IS PRESENT Confirmed by Wandra Arthurs [id number]) on 04/26/2021 9:57:48  PM ?  ? ?  ? ?Labs Reviewed  ?LIPASE, BLOOD - Abnormal; Notable for the following components:  ?    Result Value  ? Lipase 60 (*)   ? All other components within normal limits  ?COMPREHENSIVE METABOLIC PANEL - Abnormal; Notable for the following components:  ? Sodium 130 (*)   ? Glucose, Bld 119 (*)   ? Creatinine, Ser 1.33 (*)   ? Total Bilirubin 1.6 (*)   ? GFR, Estimated 55 (*)   ? All other components within normal limits  ?CBC - Abnormal; Notable for the following components:  ? WBC 11.4 (*)   ? All other components within normal limits  ?URINALYSIS, ROUTINE W REFLEX MICROSCOPIC - Abnormal; Notable for the following components:  ? Color, Urine AMBER (*)   ? APPearance HAZY (*)   ? All other components within normal limits  ?URINE CULTURE  ?TROPONIN I (HIGH SENSITIVITY)  ?TROPONIN I (HIGH SENSITIVITY)  ?  ?CT ABDOMEN PELVIS W CONTRAST  ?Final Result  ?  ?DG Chest 2 View  ?Final Result  ?  ?  ?Medications  ?lactated ringers bolus 1,000 mL (0 mLs Intravenous Stopped 04/27/21 0128)  ?iohexol (OMNIPAQUE) 300 MG/ML solution 80 mL (80 mLs Intravenous Contrast Given 04/27/21 0157)  ?  ? ?Procedures  /  Critical Care ?Procedures ? ?ED Course and Medical Decision Making  ?I have reviewed the triage vital signs, the nursing  notes, and pertinent available records from the EMR. ? ?Complexity of Problems Addressed: ?High Complexity: Acute illness/injury posing a threat to life or bodily function, requiring emergent diagnostic workup, evaluation, and treatment as below. ?Comorbidities affecting this illness/injury include: ?HTN, GERD ?Social Determinants Affecting Care: ? No clinically significant social determinants affecting this chief complaint.. ? ? ?ED Course: ?After considering the following differential, pancreatitis, UTI, KS, constipation, GERD, PUD, I ordered CT scan of abd/pelv.  Agree with other orders placed in triage. ?I personally interpreted the labs which are notable for mild hyponatremia to 130, giving some  fluids.  Creatinine mildly elevated at 1.33 along with mildly elevated lipase.  Getting fluids for this.  Initial trop is 7, doubt ACS, repeat trop is 8. ?I visualized the chest x-ray which is notable for no opacity and agree with the radiologist interpretation. ?I visualized the CT, which is notable for free air or KS and agree with radiologist interpretation. ?EKG shows NSR with RBBB, but no acute ischemic changes. ?I observed the patient while on the cardiac monitor and noted no significant dysrhythmias.. ? ?No signs of KS, pancreatitis, or cystitis.   ?  ? ?Consultants: ?No consultations were needed in caring for this patient. ? ?Treatment and Plan: ?Patient here with abdominal pain and bloating after eating.  CT is reassuring.  Slightly dry on labs, but given fluids and feels improved in the ED.  Will trial omeprazole and carafate. ? ?I considered admission due to patient's initial presentation, but after considering the examination and diagnostic results, patient will not require admission and can be discharged with outpatient follow-up. ? ? ? ?Final Clinical Impressions(s) / ED Diagnoses  ? ?  ICD-10-CM   ?1. Generalized abdominal pain  R10.84   ?  ?  ?ED Discharge Orders   ? ?      Ordered  ?  sucralfate (CARAFATE) 1 g tablet  3 times daily with meals & bedtime       ? 04/27/21 0214  ?  omeprazole (PRILOSEC) 20 MG capsule  Daily       ? 04/27/21 0214  ? ?  ?  ? ?  ?  ? ? ?Discharge Instructions Discussed with and Provided to Patient:  ? ?Discharge Instructions   ?None ?  ? ?  ?Montine Circle, PA-C ?04/27/21 0242 ? ?  ?Fatima Blank, MD ?04/27/21 [phone] ? ?

## 2021-04-26 NOTE — ED Triage Notes (Signed)
Pt states PCP sent him here for a cardiac work-up.  Reports generalized abd pain x 1 week.  Denies nausea and pain at present.  Vomited x 1 on Friday. ?

## 2021-04-26 NOTE — Telephone Encounter (Signed)
Pt was advised to go to ED ? ?Patient ?Name: ?Marcus ?D HONEYCU ?Gomez ?Gender: Male ?DOB: 1945/09/02 ?Age: 76 Y 1 M 26 D ?Return ?Phone ?Number: ?[phone] ?(Primary) ?Address: ?City/ ?State/ ?Zip: ?[place] ? [address] ?[facility] - ?Client ?Presenter, [facility] ?Provider Allwardt, Alyssa- PA ?Contact Type Call ?Who Is Calling Patient / Member / Family / Caregiver ?Call Type Triage / Clinical ?Relationship To Patient Self ?Return Phone Number [phone] (Primary) ?Chief Complaint CHEST PAIN - pain, pressure, heaviness or ?tightness ?Reason for Call Symptomatic / Request for Health Information ?Initial Comment Caller states he has severe heartburn and cannot ?eat or drink anything. ?[facility] ?Translation No ?Nurse Assessment ?Nurse: D'Heur Lucia Gaskins, RN, Adrienne Date/Time (Eastern Time): 04/26/2021 10:28:54 AM ?Confirm and document reason for call. If ?symptomatic, describe symptoms. ?---Caller states he has severe heartburn and cannot ?eat much at all or drink anything. Discomfort is in ?the umbilical area and is getting worse. It has been ?going on for over a week. He states he is unable to ?take his normal pills and even water gives him severe ?heartburn. ?Does the patient have any new or worsening ?symptoms? ---Yes ?Will a triage be completed? ---Yes ?Related visit to physician within the last 2 weeks? ---No ?Does the PT have any chronic conditions? (i.e. ?diabetes, asthma, this includes High risk factors for ?pregnancy, etc.) ?---Yes ?List chronic conditions. ---HTN ?Is this a behavioral health or substance abuse call? ---No ?Guidelines ?Guideline Title Affirmed Question Affirmed Notes Nurse Date/Time (Eastern ?Time) ?Abdominal Pain - ?Upper ?[1] Pain lasts > ?10 minutes AND ?[2] age > 28 AND ?D'Heur Lucia Gaskins, ?RN, Vincente Liberty ?04/26/2021 10:33:16 ?AM ?Guidelines ?Guideline Title Affirmed Question Affirmed Notes  Nurse Date/Time (Eastern ?Time) ?[3] at least one ?cardiac risk factor ?(i.e., hypertension, ?diabetes, obesity, ?smoker or strong ?family history of ?heart disease) ?Disp. Time (Eastern ?Time) Disposition Final User ?04/26/2021 10:27:00 AM Send to Urgent Merrily Pew ?04/26/2021 10:40:12 AM Go to ED Now Yes D'Heur Lucia Gaskins, RN, Adrienne ?Caller Disagree/Comply Comply ?Caller Understands Yes ?PreDisposition Call Doctor ?Care Advice Given Per Guideline ?GO TO ED NOW: * Leave now. Drive carefully. BRING MEDICINES: CALL EMS 911 IF: * Confusion occurs * Passes out ?or becomes too weak to stand * Severe difficulty breathing occurs. CARE ADVICE given per Abdominal Pain, Upper (Adult) ?guideline. ?Comments ?User: Vincente Liberty, D'Heur Lucia Gaskins, RN Date/Time Eilene Ghazi Time): 04/26/2021 10:33:37 AM ?Caller states he feels very queasy. No fever. ?Referrals ?GO TO FACILITY OTHER - SPECIFY ?

## 2021-04-26 NOTE — ED Provider Triage Note (Signed)
Emergency Medicine Provider Triage Evaluation Note ? ?Marcus Gamble. , a 76 y.o. male  was evaluated in triage.  Pt complains of issues with acid reflux for the past 3 weeks.  He reports he takes an acid reflux pill however cannot recall the name of it.  He also complains of issues with intermittent dizziness.  He states that he was evaluated for the dizziness and prescribed some medications however feels like it made his acid reflux worse.  He did have a CT head done a couple of days ago however has not heard about the results, per chart review it is negative.  He called today as he has been having worsening acid reflux and difficulty eating over the past couple of days due to some pain.  He was advised to come to the ED for chest pain work-up.  ? ?Review of Systems  ?Positive: + nausea, dizziness ?Negative: - abd pain, chest pain, SOB ? ?Physical Exam  ?BP 120/63 (BP Location: Right Arm)   Pulse 67   Temp 98 ?F (36.7 ?C)   Resp 20   SpO2 100%  ?Gen:   Awake, no distress   ?Resp:  Normal effort  ?MSK:   Moves extremities without difficulty  ?Other:  Abd soft and nontender ? ?Medical Decision Making  ?Medically screening exam initiated at 1:15 PM.  Appropriate orders placed.  Marcus Gamble. was informed that the remainder of the evaluation will be completed by another provider, this initial triage assessment does not replace that evaluation, and the importance of remaining in the ED until their evaluation is complete. ? ? ?  ?Eustaquio Maize, PA-C ?04/26/21 1316 ? ?

## 2021-04-26 NOTE — Telephone Encounter (Signed)
Pt states the medication that was prescribed for his nausea is now causing severe heartburn. Pt is asking if there is something else he can take or something he can add to help the heartburn. Please advise ?

## 2021-04-27 ENCOUNTER — Emergency Department (HOSPITAL_COMMUNITY): Payer: PPO

## 2021-04-27 DIAGNOSIS — R109 Unspecified abdominal pain: Secondary | ICD-10-CM | POA: Diagnosis not present

## 2021-04-27 MED ORDER — SUCRALFATE 1 G PO TABS: 1.0000 g | ORAL_TABLET | Freq: Three times a day (TID) | ORAL | 0 refills | Status: DC

## 2021-04-27 MED ORDER — OMEPRAZOLE 20 MG PO CPDR: 20.0000 mg | DELAYED_RELEASE_CAPSULE | Freq: Every day | ORAL | 0 refills | Status: DC

## 2021-04-27 MED ORDER — IOHEXOL 300 MG/ML  SOLN
80.0000 mL | Freq: Once | INTRAMUSCULAR | Status: AC | PRN
  Administered 2021-04-27: 80 mL via INTRAVENOUS

## 2021-04-27 NOTE — Progress Notes (Signed)
? ?Follow Up Note ? ?RE: Marcus Gomez. MRN: [id number] DOB: 1945-06-14 ?Date of Office Visit: 04/28/2021 ? ?Referring provider: [name], NP ?Primary care provider: [name], NP ? ?Chief Complaint: Nasal Congestion (Pnd that has triggered his cough.) ? ?History of Present Illness: ?I had the pleasure of seeing Marcus Gomez for a follow up visit at the [facility] on 04/28/2021. He is a 76 y.o. male, who is being followed for allergic rhinitis and GERD. His previous allergy office visit was on 12/25/2020 with Dr. Maudie Mercury. Today is a regular follow up visit. ? ?Perennial and seasonal allergic rhinitis ?Currently taking Nasacort 1 spray per nostril once a day with some benefit. No nosebleeds.  ?Still has some PND. ? ?Missed a few doses of allergy injections.  ? ?Still taking Singulair. ?Stopped allegra as it worsened his GERD.  ? ?GERD (gastroesophageal reflux disease) ?Taking omeprazole '20mg'$  daily in the morning. ?Sometimes reflux flares up. ? ?Patient went to the ER this week due to abdominal pain. ?CT abdomen was unremarkable.  ?He had decreased appetite as he gets nauseous after eating.  ? ?He also stopped his blood pressure medications as his readings at home has been normal. ? ?Assessment and Plan: ?Marcus Gomez is a 76 y.o. male with: ?Perennial and seasonal allergic rhinitis ?Past history - 2019 skin testing was positive to grass, trees, mold, cat, dog, cockroach and dust mites. Started AIT on 11/12/2018 (G-T-DM-D & M-C-CR). ?Interim history - increased PND.  ?Continue environmental control measures. ?Continue allergy injections - come back next week.  ?Continue Singulair '10mg'$ . ?Start Ryaltris (olopatadine + mometasone nasal spray combination) 1-2 sprays per nostril twice a day. Sample given. ?This replaces all your other nasal sprays.  ?Nasal saline spray (i.e., Simply Saline) or nasal saline lavage (i.e., NeilMed) is recommended as needed and prior to medicated nasal  sprays. ? ?GERD (gastroesophageal reflux disease) ?Not well controlled. ?Continue appropriate reflux lifestyle modifications. ?Increase omeprazole to '40mg'$  daily in the mornings if needed. ?No food/drink for 30 minutes afterwards.  ? ?Generalized abdominal pain ?Went to ER and had normal CT. Still having abdominal pain and nausea at times. ?Follow up with your PCP regarding this. ? ?Change in blood pressure ?History of hypertension but patient stopped his meds and today's blood pressure was 108/56. Been feeling dizzy at times. ?Follow up with your PCP regarding this. ?Hold hydrochlorothiazide and losartan - monitor blood pressure.  ? ?Return in about 2 months (around 06/28/2021). ? ?Meds ordered this encounter  ?Medications  ? omeprazole (PRILOSEC) 40 MG capsule  ?  Sig: Take 1 capsule (40 mg total) by mouth daily.  ?  Dispense:  30 capsule  ?  Refill:  3  ? Olopatadine-Mometasone (RYALTRIS) [id number] MCG/ACT SUSP  ?  Sig: Place 1-2 sprays into the nose in the morning and at bedtime.  ?  Dispense:  29 g  ?  Refill:  5  ?  [phone]  ? ?Lab Orders  ?No laboratory test(s) ordered today  ? ? ?Diagnostics: ?None.  ? ?Medication List:  ?Current Outpatient Medications  ?Medication Sig Dispense Refill  ? celecoxib (CELEBREX) 200 MG capsule Take 200 mg by mouth daily.    ? EPINEPHrine 0.3 mg/0.3 mL IJ SOAJ injection Inject 0.3 mLs (0.3 mg total) into the muscle once as needed for up to 1 dose for anaphylaxis. 0.3 mL 1  ? hydrochlorothiazide (HYDRODIURIL) 25 MG tablet Take 1 tablet (25 mg total) by mouth daily. 90 tablet 1  ?  losartan (COZAAR) 100 MG tablet Take 1 tablet (100 mg total) by mouth daily. 90 tablet 1  ? meclizine (ANTIVERT) 25 MG tablet Take 25 mg by mouth 3 (three) times daily as needed.    ? montelukast (SINGULAIR) 10 MG tablet Take by mouth.    ? Multiple Vitamins-Minerals (PRESERVISION AREDS 2+MULTI VIT PO) Take 1 capsule by mouth in the morning and at bedtime.    ? Olopatadine-Mometasone (RYALTRIS) [id number]  MCG/ACT SUSP Place 1-2 sprays into the nose in the morning and at bedtime. 29 g 5  ? omeprazole (PRILOSEC) 40 MG capsule Take 1 capsule (40 mg total) by mouth daily. 30 capsule 3  ? Propylene Glycol 0.6 % SOLN Place 1 drop into both eyes 2 (two) times daily. BID    ? rosuvastatin (CRESTOR) 5 MG tablet Take 1 tablet (5 mg total) by mouth every other day. 45 tablet 3  ? sucralfate (CARAFATE) 1 g tablet Take 1 tablet (1 g total) by mouth 4 (four) times daily -  with meals and at bedtime. 120 tablet 0  ? Turmeric (QC TUMERIC COMPLEX PO) Take 1,000 mg by mouth in the morning and at bedtime.    ? ?No current facility-administered medications for this visit.  ? ?Allergies: ?Allergies  ?Allergen Reactions  ? Lisinopril Cough  ? ?I reviewed his past medical history, social history, family history, and environmental history and no significant changes have been reported from his previous visit. ? ?Review of Systems  ?Constitutional:  Positive for appetite change. Negative for chills, fever and unexpected weight change.  ?HENT:  Positive for congestion. Negative for postnasal drip and rhinorrhea.   ?Eyes:  Negative for itching.  ?Respiratory:  Negative for cough, chest tightness, shortness of breath and wheezing.   ?Gastrointestinal:  Positive for abdominal pain and nausea.  ?Skin:  Negative for rash.  ?Allergic/Immunologic: Positive for environmental allergies.  ?Neurological:  Positive for dizziness. Negative for headaches.  ? ?Objective: ?BP (!) 108/56 (BP Location: Right Arm, Patient Position: Sitting, Cuff Size: Normal)   Pulse 60   Temp (!) 97.5 ?F (36.4 ?C) (Temporal)   Resp 12   SpO2 100%  ?There is no height or weight on file to calculate BMI. ?Physical Exam ?Vitals and nursing note reviewed.  ?Constitutional:   ?   Appearance: Normal appearance. He is well-developed.  ?HENT:  ?   Head: Normocephalic and atraumatic.  ?   Right Ear: Tympanic membrane and external ear normal.  ?   Left Ear: Tympanic membrane and  external ear normal.  ?   Nose: Nose normal.  ?   Mouth/Throat:  ?   Mouth: Mucous membranes are moist.  ?   Pharynx: Oropharynx is clear.  ?Eyes:  ?   Conjunctiva/sclera: Conjunctivae normal.  ?Cardiovascular:  ?   Rate and Rhythm: Normal rate and regular rhythm.  ?   Heart sounds: Normal heart sounds. No murmur heard. ?Pulmonary:  ?   Effort: Pulmonary effort is normal.  ?   Breath sounds: Normal breath sounds. No wheezing, rhonchi or rales.  ?Musculoskeletal:  ?   Cervical back: Neck supple.  ?Skin: ?   General: Skin is warm.  ?   Findings: No rash.  ?Neurological:  ?   Mental Status: He is alert and oriented to person, place, and time.  ?Psychiatric:     ?   Behavior: Behavior normal.  ? ?Previous notes and tests were reviewed. ?The plan was reviewed with the patient/family, and all questions/concerned were addressed. ? ?  It was my pleasure to see Marcus Gomez today and participate in his care. Please feel free to contact me with any questions or concerns. ? ?Sincerely, ? ?Rexene Alberts, DO ?Allergy & Immunology ? ?[facility] ?[place] office: [phone] ?[place] office: [phone] ?

## 2021-04-28 ENCOUNTER — Encounter: Payer: Self-pay | Admitting: Allergy

## 2021-04-28 ENCOUNTER — Ambulatory Visit: Payer: PPO | Admitting: Allergy

## 2021-04-28 VITALS — BP 108/56 | HR 60 | Temp 97.5°F | Resp 12

## 2021-04-28 DIAGNOSIS — K219 Gastro-esophageal reflux disease without esophagitis: Secondary | ICD-10-CM

## 2021-04-28 DIAGNOSIS — R1084 Generalized abdominal pain: Secondary | ICD-10-CM | POA: Insufficient documentation

## 2021-04-28 DIAGNOSIS — J3089 Other allergic rhinitis: Secondary | ICD-10-CM | POA: Diagnosis not present

## 2021-04-28 DIAGNOSIS — R6889 Other general symptoms and signs: Secondary | ICD-10-CM | POA: Insufficient documentation

## 2021-04-28 LAB — URINE CULTURE: Culture: NO GROWTH

## 2021-04-28 MED ORDER — OMEPRAZOLE 40 MG PO CPDR: 40.0000 mg | DELAYED_RELEASE_CAPSULE | Freq: Every day | ORAL | 3 refills | Status: DC

## 2021-04-28 MED ORDER — RYALTRIS 665-25 MCG/ACT NA SUSP: 1.0000 | Freq: Two times a day (BID) | NASAL | 5 refills | Status: DC

## 2021-04-28 NOTE — Assessment & Plan Note (Signed)
Not well controlled. ?? Continue appropriate reflux lifestyle modifications. ?? Increase omeprazole to '40mg'$  daily in the mornings if needed. ?? No food/drink for 30 minutes afterwards.  ?

## 2021-04-28 NOTE — Assessment & Plan Note (Signed)
History of hypertension but patient stopped his meds and today's blood pressure was 108/56. Been feeling dizzy at times. ?? Follow up with your PCP regarding this. ?? Hold hydrochlorothiazide and losartan - monitor blood pressure.  ?

## 2021-04-28 NOTE — Patient Instructions (Addendum)
Perennial and seasonal allergic rhinitis ?2019 skin testing was positive to grass, trees, mold, cat, dog, cockroach and dust mites. ?Continue environmental control measures. ?Continue allergy injections - come back next week.  ?Continue Singulair '10mg'$ . ?Start Ryaltris (olopatadine + mometasone nasal spray combination) 1-2 sprays per nostril twice a day. Sample given. ?This replaces all your other nasal sprays.  ? ?Nasal saline spray (i.e., Simply Saline) or nasal saline lavage (i.e., NeilMed) is recommended as needed and prior to medicated nasal sprays. ?  ?GERD (gastroesophageal reflux disease) ?Continue appropriate reflux lifestyle modifications. ?Increase omeprazole to '40mg'$  daily in the mornings if needed. ?No food/drink for 30 minutes afterwards.  ? ?Follow up with your PCP regarding the abdominal issues and dizziness. ?Hold hydrochlorothiazide and losartan - monitor blood pressure.  ? ?Follow up in 2 months or sooner if needed. ?

## 2021-04-28 NOTE — Assessment & Plan Note (Signed)
Past history - 2019 skin testing was positive to grass, trees, mold, cat, dog, cockroach and dust mites. Started AIT on 11/12/2018 (G-T-DM-D & M-C-CR). ?Interim history - increased PND.  ?? Continue environmental control measures. ?? Continue allergy injections - come back next week.  ?? Continue Singulair '10mg'$ . ?? Start Ryaltris (olopatadine + mometasone nasal spray combination) 1-2 sprays per nostril twice a day. Sample given. ?? This replaces all your other nasal sprays.  ?? Nasal saline spray (i.e., Simply Saline) or nasal saline lavage (i.e., NeilMed) is recommended as needed and prior to medicated nasal sprays. ?

## 2021-04-28 NOTE — Assessment & Plan Note (Signed)
Went to ER and had normal CT. Still having abdominal pain and nausea at times. ?? Follow up with your PCP regarding this. ?

## 2021-05-06 ENCOUNTER — Ambulatory Visit (INDEPENDENT_AMBULATORY_CARE_PROVIDER_SITE_OTHER): Payer: PPO

## 2021-05-06 DIAGNOSIS — J309 Allergic rhinitis, unspecified: Secondary | ICD-10-CM

## 2021-05-13 ENCOUNTER — Ambulatory Visit (INDEPENDENT_AMBULATORY_CARE_PROVIDER_SITE_OTHER): Payer: PPO

## 2021-05-13 DIAGNOSIS — J309 Allergic rhinitis, unspecified: Secondary | ICD-10-CM | POA: Diagnosis not present

## 2021-05-19 ENCOUNTER — Ambulatory Visit (INDEPENDENT_AMBULATORY_CARE_PROVIDER_SITE_OTHER): Payer: PPO

## 2021-05-19 DIAGNOSIS — J309 Allergic rhinitis, unspecified: Secondary | ICD-10-CM | POA: Diagnosis not present

## 2021-05-24 DIAGNOSIS — C61 Malignant neoplasm of prostate: Secondary | ICD-10-CM | POA: Diagnosis not present

## 2021-05-26 ENCOUNTER — Ambulatory Visit (INDEPENDENT_AMBULATORY_CARE_PROVIDER_SITE_OTHER): Payer: PPO

## 2021-05-26 DIAGNOSIS — J309 Allergic rhinitis, unspecified: Secondary | ICD-10-CM | POA: Diagnosis not present

## 2021-06-02 ENCOUNTER — Encounter: Payer: Self-pay | Admitting: Physician Assistant

## 2021-06-03 ENCOUNTER — Ambulatory Visit (INDEPENDENT_AMBULATORY_CARE_PROVIDER_SITE_OTHER): Payer: PPO

## 2021-06-03 DIAGNOSIS — J309 Allergic rhinitis, unspecified: Secondary | ICD-10-CM

## 2021-06-07 ENCOUNTER — Other Ambulatory Visit: Payer: Self-pay | Admitting: Adult Health

## 2021-06-07 DIAGNOSIS — I10 Essential (primary) hypertension: Secondary | ICD-10-CM

## 2021-06-16 ENCOUNTER — Other Ambulatory Visit: Payer: Self-pay | Admitting: Adult Health

## 2021-06-16 DIAGNOSIS — I10 Essential (primary) hypertension: Secondary | ICD-10-CM

## 2021-06-18 ENCOUNTER — Encounter: Payer: Self-pay | Admitting: *Deleted

## 2021-06-22 ENCOUNTER — Ambulatory Visit (INDEPENDENT_AMBULATORY_CARE_PROVIDER_SITE_OTHER): Payer: PPO

## 2021-06-22 DIAGNOSIS — J309 Allergic rhinitis, unspecified: Secondary | ICD-10-CM | POA: Diagnosis not present

## 2021-06-24 NOTE — Progress Notes (Signed)
06/25/2021 Marcus Gomez [id number] 1945/03/23  Referring provider: [name], NP Primary GI doctor: Dr. Candis Schatz  ASSESSMENT AND PLAN:   76 year old male with GERD history, prodrome with dizziness, nausea, negative CT head, continuing nausea with some esophageal globus sensation/dysphagia Has had 20 lbs weight loss Normal CT AB and pelvis with contrast Some early satiety Will plan for EGD to evaluate, if negative can consider gastric emptying study Continue prilosec 20 mg (40 mg and carafate did not help) Add on zofran as needed Will schedule EGD to evaluate for possible H. pylori, esophagitis, gastritis, peptic ulcer disease, etc.. I discussed risks of EGD with patient today, including risk of sedation, bleeding or perforation.  Patient provides understanding and gave verbal consent to proceed. -     CBC with Differential/Platelet; Future -     Comprehensive metabolic panel; Future -     Lipase; Future -     ondansetron (ZOFRAN) 4 MG tablet; Take 1 tablet (4 mg total) by mouth every 8 (eight) hours as needed for nausea or vomiting (ideally before eating).  Screening colonoscopy 01/07/2013 colonoscopy at [facility] with Dr. Paulita Fujita for routine screening diverticula throughout colon, sessile polyp 3 mm AVMs likely sequelae of prostate radiation therapy No symptoms at this time Recall 12/2022   Patient Care Team: [name], NP as PCP - General (Family Medicine) [name], [facility] as Pharmacist (Pharmacist) Ortho, [facility] (Orthopedic Surgery)  HISTORY OF PRESENT ILLNESS: 76 y.o. male with a past medical history of hypertension, upper lipidemia, history of prostate cancer status post radiation therapy and others listed below presents for evaluation of dyspepsia with ER visit normal CT abdomen pelvis.   01/07/2013 colonoscopy at [place] with Dr. Paulita Fujita for routine screening diverticula throughout colon, sessile polyp 3 mm AVMs likely sequelae of  prostate radiation therapy-recall 10 years (12/2022)  Patient went to primary care 04/23/2021 feeling like the flu with nausea dizziness.  Had normal CT head. 04/26/2021 ER visit for postprandial abdominal pain with bloating and nausea 1 episode of vomiting 04/27/2021 CT abdomen pelvis with contrast showed no intra-abdominal pathology, diverticulosis without inflammation Lipase 60 , white blood cell count 11.4, hemoglobin 13.7, platelets 249 negative troponin normal urine sodium 130, BUN 18, creatinine 1.33, glucose 119, total bilirubin 1.6  Patient states stomach stays upset all the time, worse with food.  Stays hard after eating, like blackberries and yogurt this AM.  Patient states he does not want to move or do much  He is unable to drink coffee or sodas because this will make his stomach hurt worse. Just drinking water. He has early satiety.  He has been on the omeprazole 20 mg for GERD 2 years, on 04/19 increase to '40mg'$  due to GERD, in the last 2 weeks this has improved so he went back to '20mg'$ . Carafate from ER did not help.  States feels like he globus sensation, feels he has trouble swallowing anything without water. Feels his voice is more hoarse.  Has nausea but no vomiting, can have gagging. Will have AB bloating and just quesy feeling but no AB pain.  He denies melena. Has lost about 20 lbs.  He denies ETOH use in last 10 years, prior to that drank daily and admittedly too much. Has never smoked.  He denies family history of gallbladder issues.  Patient does have Celebrex one and off for hand pain for years, can take every 4 days and turmeric has been on 10 years.  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  Wt Readings from Last 3 Encounters:  06/25/21 223 lb 2 oz (101.2 kg)  04/23/21 231 lb 9.6 oz (105.1 kg)  01/28/21 241 lb (109.3 kg)    Current Medications:    Current Outpatient Medications (Cardiovascular):    hydrochlorothiazide (HYDRODIURIL) 25 MG tablet, Take 1 tablet by  mouth once daily   losartan (COZAAR) 100 MG tablet, Take 1 tablet by mouth once daily   rosuvastatin (CRESTOR) 5 MG tablet, Take 1 tablet (5 mg total) by mouth every other day.   EPINEPHrine 0.3 mg/0.3 mL IJ SOAJ injection, Inject 0.3 mLs (0.3 mg total) into the muscle once as needed for up to 1 dose for anaphylaxis. (Patient not taking: Reported on 06/25/2021)  Current Outpatient Medications (Respiratory):    montelukast (SINGULAIR) 10 MG tablet, Take by mouth.   Olopatadine-Mometasone (RYALTRIS) 665-25 MCG/ACT SUSP, Place 1-2 sprays into the nose in the morning and at bedtime.  Current Outpatient Medications (Analgesics):    celecoxib (CELEBREX) 200 MG capsule, Take 200 mg by mouth daily.   Current Outpatient Medications (Other):    AMBULATORY NON FORMULARY MEDICATION, Allergy Shots Every 3 weeks   Carboxymethylcellulose Sodium (LUBRICATING PLUS EYE DROPS OP), Apply 1 drop to eye 2 (two) times daily.   Multiple Vitamin (MULTIVITAMIN) tablet, Take 1 tablet by mouth daily.   Multiple Vitamins-Minerals (PRESERVISION AREDS 2+MULTI VIT PO), Take 1 capsule by mouth in the morning and at bedtime.   omeprazole (PRILOSEC) 20 MG capsule, Take 20 mg by mouth as needed.   omeprazole (PRILOSEC) 40 MG capsule, Take 1 capsule (40 mg total) by mouth daily. (Patient taking differently: Take 40 mg by mouth as needed.)   ondansetron (ZOFRAN) 4 MG tablet, Take 1 tablet (4 mg total) by mouth every 8 (eight) hours as needed for nausea or vomiting (ideally before eating).   Turmeric (QC TUMERIC COMPLEX PO), Take 1,000 mg by mouth in the morning and at bedtime.   meclizine (ANTIVERT) 25 MG tablet, Take 25 mg by mouth 3 (three) times daily as needed. (Patient not taking: Reported on 06/25/2021)  Medical History:  Past Medical History:  Diagnosis Date   Allergies    Aortic atherosclerosis (HCC)    Arthritis    Diverticulosis    Essential hypertension    Prostate cancer ([place])    Remission   Spondylosis     Allergies:  Allergies  Allergen Reactions   Lisinopril Cough     Surgical History:  He  has a past surgical history that includes Knee surgery (Bilateral, 2015 & 2014); Shoulder surgery (Right, 2016); and Tonsillectomy. Family History:  His family history includes Allergies in his father; COPD in his father; Dementia in his mother; Glaucoma in his father and paternal grandmother; Hypotension in his mother; Rheum arthritis in his father; Stroke in his mother. Social History:   reports that he has never smoked. He has never used smokeless tobacco. He reports that he does not currently use alcohol. He reports that he does not use drugs.  REVIEW OF SYSTEMS  : All other systems reviewed and negative except where noted in the History of Present Illness.   PHYSICAL EXAM: BP 120/60 (BP Location: Left Arm, Patient Position: Sitting, Cuff Size: Normal)   Pulse 76   Ht 5' 9.5" (1.765 m) Comment: height measured without shoes  Wt 223 lb 2 oz (101.2 kg)   BMI 32.48 kg/m  General:   Pleasant, well developed male in no acute distress Head:  Normocephalic and atraumatic. Eyes:  sclerae anicteric,conjunctive pink  Heart:   regular rate and rhythm Pulm:  Clear anteriorly; no wheezing Abdomen:   Soft, Obese AB, Active bowel sounds. No tenderness . , No organomegaly appreciated. Rectal: Not evaluated Extremities:  Without edema. Msk: Symmetrical without gross deformities. Peripheral pulses intact.  Neurologic:  Alert and  oriented x4;  No focal deficits.  Skin:   Dry and intact without significant lesions or rashes. Psychiatric:  Cooperative. Normal mood and affect.    Vladimir Crofts, PA-C 9:52 AM

## 2021-06-25 ENCOUNTER — Ambulatory Visit: Payer: PPO | Admitting: Physician Assistant

## 2021-06-25 ENCOUNTER — Other Ambulatory Visit (INDEPENDENT_AMBULATORY_CARE_PROVIDER_SITE_OTHER): Payer: PPO

## 2021-06-25 ENCOUNTER — Encounter: Payer: Self-pay | Admitting: Physician Assistant

## 2021-06-25 ENCOUNTER — Other Ambulatory Visit: Payer: Self-pay

## 2021-06-25 VITALS — BP 120/60 | HR 76 | Ht 69.5 in | Wt 223.1 lb

## 2021-06-25 DIAGNOSIS — R110 Nausea: Secondary | ICD-10-CM

## 2021-06-25 DIAGNOSIS — R1314 Dysphagia, pharyngoesophageal phase: Secondary | ICD-10-CM | POA: Diagnosis not present

## 2021-06-25 DIAGNOSIS — R634 Abnormal weight loss: Secondary | ICD-10-CM | POA: Diagnosis not present

## 2021-06-25 DIAGNOSIS — K219 Gastro-esophageal reflux disease without esophagitis: Secondary | ICD-10-CM

## 2021-06-25 DIAGNOSIS — R1084 Generalized abdominal pain: Secondary | ICD-10-CM

## 2021-06-25 DIAGNOSIS — R6881 Early satiety: Secondary | ICD-10-CM

## 2021-06-25 DIAGNOSIS — Z1211 Encounter for screening for malignant neoplasm of colon: Secondary | ICD-10-CM

## 2021-06-25 LAB — COMPREHENSIVE METABOLIC PANEL
ALT: 16 U/L (ref 0–53)
AST: 16 U/L (ref 0–37)
Albumin: 4.3 g/dL (ref 3.5–5.2)
Alkaline Phosphatase: 62 U/L (ref 39–117)
BUN: 13 mg/dL (ref 6–23)
CO2: 26 mEq/L (ref 19–32)
Calcium: 9.6 mg/dL (ref 8.4–10.5)
Chloride: 102 mEq/L (ref 96–112)
Creatinine, Ser: 0.91 mg/dL (ref 0.40–1.50)
GFR: 81.98 mL/min (ref >60.00)
Glucose, Bld: 99 mg/dL (ref 70–99)
Potassium: 4 mEq/L (ref 3.5–5.1)
Sodium: 137 mEq/L (ref 135–145)
Total Bilirubin: 1.4 mg/dL — ABNORMAL HIGH (ref 0.2–1.2)
Total Protein: 7.2 g/dL (ref 6.0–8.3)

## 2021-06-25 LAB — VITAMIN B12: Vitamin B-12: 388 pg/mL (ref 211–911)

## 2021-06-25 LAB — IBC + FERRITIN
Ferritin: 124.5 ng/mL (ref 22.0–322.0)
Iron: 70 ug/dL (ref 42–165)
Saturation Ratios: 19.2 % — ABNORMAL LOW (ref 20.0–50.0)
TIBC: 365.4 ug/dL (ref 250.0–450.0)
Transferrin: 261 mg/dL (ref 212.0–360.0)

## 2021-06-25 LAB — CBC WITH DIFFERENTIAL/PLATELET
Basophils Absolute: 0 10*3/uL (ref 0.0–0.1)
Basophils Relative: 0.4 % (ref 0.0–3.0)
Eosinophils Absolute: 0.1 10*3/uL (ref 0.0–0.7)
Eosinophils Relative: 0.9 % (ref 0.0–5.0)
HCT: 38.1 % — ABNORMAL LOW (ref 39.0–52.0)
Hemoglobin: 12.8 g/dL — ABNORMAL LOW (ref 13.0–17.0)
Lymphocytes Relative: 25.1 % (ref 12.0–46.0)
Lymphs Abs: 1.8 10*3/uL (ref 0.7–4.0)
MCHC: 33.5 g/dL (ref 30.0–36.0)
MCV: 88.6 fl (ref 78.0–100.0)
Monocytes Absolute: 0.6 10*3/uL (ref 0.1–1.0)
Monocytes Relative: 8.9 % (ref 3.0–12.0)
Neutro Abs: 4.7 10*3/uL (ref 1.4–7.7)
Neutrophils Relative %: 64.7 % (ref 43.0–77.0)
Platelets: 209 10*3/uL (ref 150.0–400.0)
RBC: 4.3 Mil/uL (ref 4.22–5.81)
RDW: 14.3 % (ref 11.5–15.5)
WBC: 7.2 10*3/uL (ref 4.0–10.5)

## 2021-06-25 LAB — LIPASE: Lipase: 37 U/L (ref 11.0–59.0)

## 2021-06-25 MED ORDER — ONDANSETRON HCL 4 MG PO TABS: 4.0000 mg | ORAL_TABLET | Freq: Three times a day (TID) | ORAL | 0 refills | Status: DC | PRN

## 2021-06-25 NOTE — Progress Notes (Signed)
Agree with the assessment and plan as outlined by Amanda Collier,  PA-C.  Hadlei Stitt E. Jailyn Langhorst, MD  

## 2021-06-25 NOTE — Patient Instructions (Addendum)
You have been scheduled for an endoscopy. Please follow written instructions given to you at your visit today. If you use inhalers (even only as needed), please bring them with you on the day of your procedure.   Your provider has requested that you go to the basement level for lab work before leaving today. Press "B" on the elevator. The lab is located at the first door on the left as you exit the elevator.   Gastroparesis Please do small frequent meals like 4-6 meals a day.  Eat and drink liquids at separate times.  Avoid high fiber foods, cook your vegetables, avoid high fat food.  Suggest spreading protein throughout the day (greek yogurt, glucerna, soft meat, milk, eggs) Choose soft foods that you can mash with a fork When you are more symptomatic, change to pureed foods foods and liquids.  Consider reading "Living well with Gastroparesis" by Lambert Keto Gastroparesis is a condition in which food takes longer than normal to empty from the stomach. This condition is also known as delayed gastric emptying. It is usually a long-term (chronic) condition. There is no cure, but there are treatments and things that you can do at home to help relieve symptoms. Treating the underlying condition that causes gastroparesis can also help relieve symptoms  Can try zofran 4 mg before food for nausea Continue the ompeprazole 20 mg Avoid spicy and acidic foods Avoid fatty foods Limit your intake of coffee, tea, alcohol, and carbonated drinks Work to maintain a healthy weight Keep the head of the bed elevated at least 3 inches with blocks or a wedge pillow if you are having any nighttime symptoms Stay upright for 2 hours after eating Avoid meals and snacks three to four hours before bedtime  Diverticulosis Diverticulosis is a condition that develops when small pouches (diverticula) form in the wall of the large intestine (colon). The colon is where water is absorbed and stool (feces) is  formed. The pouches form when the inside layer of the colon pushes through weak spots in the outer layers of the colon. You may have a few pouches or many of them. The pouches usually do not cause problems unless they become inflamed or infected. When this happens, the condition is called diverticulitis- this is left lower quadrant pain, diarrhea, fever, chills, nausea or vomiting.  If this occurs please call the office or go to the hospital. Sometimes these patches without inflammation can also have painless bleeding associated with them, if this happens please call the office or go to the hospital. Preventing constipation and increasing fiber can help reduce diverticula and prevent complications. Even if you feel you have a high-fiber diet, suggest getting on Benefiber 2-3 times daily. __________________________________________  If you are age 65 or older, your body mass index should be between 23-30. Your Body mass index is 32.48 kg/m. If this is out of the aforementioned range listed, please consider follow up with your Primary Care Provider.  If you are age 69 or younger, your body mass index should be between 19-25. Your Body mass index is 32.48 kg/m. If this is out of the aformentioned range listed, please consider follow up with your Primary Care Provider.   ________________________________________________________  The [facility] providers would like to encourage you to use [facility] to communicate with providers for non-urgent requests or questions.  Due to long hold times on the telephone, sending your provider a message by [facility] may be a faster and more efficient way to get a response.  Please allow 48 business hours for a response.  Please remember that this is for non-urgent requests.  _______________________________________________________ Due to recent changes in healthcare laws, you may see the results of your imaging and laboratory studies on MyChart before your provider has had a  chance to review them.  We understand that in some cases there may be results that are confusing or concerning to you. Not all laboratory results come back in the same time frame and the provider may be waiting for multiple results in order to interpret others.  Please give [place] 48 hours in order for your provider to thoroughly review all the results before contacting the office for clarification of your results.

## 2021-06-26 ENCOUNTER — Other Ambulatory Visit (HOSPITAL_COMMUNITY): Payer: Self-pay

## 2021-06-26 ENCOUNTER — Telehealth: Payer: Self-pay | Admitting: Pharmacy Technician

## 2021-06-26 NOTE — Telephone Encounter (Signed)
Patient Advocate Encounter  Received notification from [facility] that prior authorization for ONDANSETRON '4MG'$  is required.   PA submitted on 6.17.23 Key [id number] Status is pending    Luciano Cutter, CPhT Patient Advocate Phone: [phone]

## 2021-06-27 NOTE — Progress Notes (Signed)
Follow Up Note  RE: Marcus Gomez. MRN: [id number] DOB: Dec 08, 1945 Date of Office Visit: 06/28/2021  Referring provider: Shirline Frees, NP Primary care provider: Shirline Frees, NP  Chief Complaint: Follow-up (Congestion in the throat--get horse at times still)  History of Present Illness: I had the pleasure of seeing Marcus Gomez for a follow up visit at the [facility] on 06/28/2021. He is a 76 y.o. male, who is being followed for allergic rhinitis on AIT, GERD. His previous allergy office visit was on 04/28/2021 with Dr. Selena Batten. Today is a regular follow up visit.  Perennial and seasonal allergic rhinitis Currently on Ryaltris 2 sprays per nostril at night and thinks it's helping. No nosebleeds. Only using Singulair as needed.  No issues with the allergy injections. Having some hoarseness in his voice - discussed that I'm not sure if this is due to PND vs reflux. Has EGD tomorrow.    GERD (gastroesophageal reflux disease) Currently on omeprazole 40mg  and doing well on it. He is scheduled for an EGD tomorrow.    Change in blood pressure Now back on hctz and losartan as his blood pressure went up after stopping it. No more dizziness.   06/25/2021 GI visit: "Has had 20 lbs weight loss Normal CT AB and pelvis with contrast Some early satiety Will plan for EGD to evaluate, if negative can consider gastric emptying study Continue prilosec 20 mg (40 mg and carafate did not help) Add on zofran as needed Will schedule EGD to evaluate for possible H. pylori, esophagitis, gastritis, peptic ulcer disease, etc.. I discussed risks of EGD with patient today, including risk of sedation, bleeding or perforation.  Patient provides understanding and gave verbal consent to proceed. -     CBC with Differential/Platelet; Future -     Comprehensive metabolic panel; Future -     Lipase; Future -     ondansetron (ZOFRAN) 4 MG tablet; Take 1 tablet (4 mg total) by  mouth every 8 (eight) hours as needed for nausea or vomiting (ideally before eating)."  Assessment and Plan: Marcus Gomez is a 76 y.o. male with: Perennial and seasonal allergic rhinitis Past history - 2019 skin testing was positive to grass, trees, mold, cat, dog, cockroach and dust mites. Started AIT on 11/12/2018 (G-T-DM-D & M-C-CR). Interim history - some voice hoarseness.   Continue environmental control measures. Continue allergy injections. May use Singulair 10mg  daily as needed.  Continue Ryaltris (olopatadine + mometasone nasal spray combination) 1-2 sprays per nostril twice a day.  Nasal saline spray (i.e., Simply Saline) or nasal saline lavage (i.e., NeilMed) is recommended as needed and prior to medicated nasal sprays.  GERD (gastroesophageal reflux disease) Has EGD scheduled for tomorrow.  Continue appropriate reflux lifestyle modifications. Continue omeprazole 40mg  daily in the mornings if needed. No food/drink for 30 minutes afterwards.  Follow up with your GI as scheduled.   Return in about 4 months (around 10/28/2021).  No orders of the defined types were placed in this encounter.  Lab Orders  No laboratory test(s) ordered today    Diagnostics: None.   Medication List:  Current Outpatient Medications  Medication Sig Dispense Refill   AMBULATORY NON FORMULARY MEDICATION Allergy Shots Every 3 weeks     Carboxymethylcellulose Sodium (LUBRICATING PLUS EYE DROPS OP) Apply 1 drop to eye 2 (two) times daily.     celecoxib (CELEBREX) 200 MG capsule Take 200 mg by mouth daily.     EPINEPHrine 0.3 mg/0.3 mL IJ  SOAJ injection Inject 0.3 mLs (0.3 mg total) into the muscle once as needed for up to 1 dose for anaphylaxis. 0.3 mL 1   hydrochlorothiazide (HYDRODIURIL) 25 MG tablet Take 1 tablet by mouth once daily 90 tablet 0   losartan (COZAAR) 100 MG tablet Take 1 tablet by mouth once daily 90 tablet 0   meclizine (ANTIVERT) 25 MG tablet Take 25 mg by mouth 3 (three) times daily  as needed.     montelukast (SINGULAIR) 10 MG tablet Take by mouth.     Multiple Vitamin (MULTIVITAMIN) tablet Take 1 tablet by mouth daily.     Multiple Vitamins-Minerals (PRESERVISION AREDS 2+MULTI VIT PO) Take 1 capsule by mouth in the morning and at bedtime.     Olopatadine-Mometasone (RYALTRIS) [id number] MCG/ACT SUSP Place 1-2 sprays into the nose in the morning and at bedtime. 29 g 5   omeprazole (PRILOSEC) 40 MG capsule Take 1 capsule (40 mg total) by mouth daily. (Patient taking differently: Take 40 mg by mouth as needed.) 30 capsule 3   ondansetron (ZOFRAN) 4 MG tablet Take 1 tablet (4 mg total) by mouth every 8 (eight) hours as needed for nausea or vomiting (ideally before eating). 30 tablet 0   rosuvastatin (CRESTOR) 5 MG tablet Take 1 tablet (5 mg total) by mouth every other day. 45 tablet 3   Turmeric (QC TUMERIC COMPLEX PO) Take 1,000 mg by mouth in the morning and at bedtime.     No current facility-administered medications for this visit.   Allergies: Allergies  Allergen Reactions   Lisinopril Cough   I reviewed his past medical history, social history, family history, and environmental history and no significant changes have been reported from his previous visit.  Review of Systems  Constitutional:  Negative for appetite change, chills, fever and unexpected weight change.  HENT:  Positive for congestion and voice change. Negative for postnasal drip and rhinorrhea.   Eyes:  Negative for itching.  Respiratory:  Negative for cough, chest tightness, shortness of breath and wheezing.   Gastrointestinal:  Negative for abdominal pain.  Skin:  Negative for rash.  Allergic/Immunologic: Positive for environmental allergies.  Neurological:  Negative for dizziness and headaches.    Objective: BP 122/68   Pulse 82   Temp 97.8 F (36.6 C)   Resp 18   Ht 5' 9.5" (1.765 m)   Wt 227 lb (103 kg)   SpO2 98%   BMI 33.04 kg/m  Body mass index is 33.04 kg/m. Physical Exam Vitals  and nursing note reviewed.  Constitutional:      Appearance: Normal appearance. He is well-developed.  HENT:     Head: Normocephalic and atraumatic.     Right Ear: Tympanic membrane and external ear normal.     Left Ear: Tympanic membrane and external ear normal.     Nose: Nose normal.     Mouth/Throat:     Mouth: Mucous membranes are moist.     Pharynx: Oropharynx is clear.  Eyes:     Conjunctiva/sclera: Conjunctivae normal.  Cardiovascular:     Rate and Rhythm: Normal rate and regular rhythm.     Heart sounds: Normal heart sounds. No murmur heard. Pulmonary:     Effort: Pulmonary effort is normal.     Breath sounds: Normal breath sounds. No wheezing, rhonchi or rales.  Musculoskeletal:     Cervical back: Neck supple.  Skin:    General: Skin is warm.     Findings: No rash.  Neurological:  Mental Status: He is alert and oriented to person, place, and time.  Psychiatric:        Behavior: Behavior normal.    Previous notes and tests were reviewed. The plan was reviewed with the patient/family, and all questions/concerned were addressed.  It was my pleasure to see Marcus Gomez today and participate in his care. Please feel free to contact me with any questions or concerns.  Sincerely,  Wyline Mood, DO Allergy & Immunology  [facility] office: [phone] [facility] office: [phone]

## 2021-06-28 ENCOUNTER — Encounter: Payer: Self-pay | Admitting: Allergy

## 2021-06-28 ENCOUNTER — Ambulatory Visit (INDEPENDENT_AMBULATORY_CARE_PROVIDER_SITE_OTHER): Payer: PPO | Admitting: Allergy

## 2021-06-28 VITALS — BP 122/68 | HR 82 | Temp 97.8°F | Resp 18 | Ht 69.5 in | Wt 227.0 lb

## 2021-06-28 DIAGNOSIS — J3089 Other allergic rhinitis: Secondary | ICD-10-CM

## 2021-06-28 DIAGNOSIS — K219 Gastro-esophageal reflux disease without esophagitis: Secondary | ICD-10-CM

## 2021-06-28 NOTE — Patient Instructions (Addendum)
Perennial and seasonal allergic rhinitis 2019 skin testing was positive to grass, trees, mold, cat, dog, cockroach and dust mites. Continue environmental control measures. Continue allergy injections. May use Singulair '10mg'$  daily as needed.  Continue Ryaltris (olopatadine + mometasone nasal spray combination) 1-2 sprays per nostril twice a day.  Nasal saline spray (i.e., Simply Saline) or nasal saline lavage (i.e., NeilMed) is recommended as needed and prior to medicated nasal sprays.   GERD (gastroesophageal reflux disease) Continue appropriate reflux lifestyle modifications. Continue omeprazole '40mg'$  daily in the mornings if needed. No food/drink for 30 minutes afterwards.   Follow up with your GI as scheduled.   Follow up in 4 months or sooner if needed.

## 2021-06-28 NOTE — Assessment & Plan Note (Signed)
Past history - 2019 skin testing was positive to grass, trees, mold, cat, dog, cockroach and dust mites. Started AIT on 11/12/2018 (G-T-DM-D & M-C-CR). Interim history - some voice hoarseness.    Continue environmental control measures.  Continue allergy injections.  May use Singulair '10mg'$  daily as needed.   Continue Ryaltris (olopatadine + mometasone nasal spray combination) 1-2 sprays per nostril twice a day.   Nasal saline spray (i.e., Simply Saline) or nasal saline lavage (i.e., NeilMed) is recommended as needed and prior to medicated nasal sprays.

## 2021-06-28 NOTE — Assessment & Plan Note (Signed)
Has EGD scheduled for tomorrow.   Continue appropriate reflux lifestyle modifications.  Continue omeprazole '40mg'$  daily in the mornings if needed.  No food/drink for 30 minutes afterwards.   Follow up with your GI as scheduled.

## 2021-06-29 ENCOUNTER — Encounter: Payer: Self-pay | Admitting: Gastroenterology

## 2021-06-29 ENCOUNTER — Ambulatory Visit (AMBULATORY_SURGERY_CENTER): Payer: PPO | Admitting: Gastroenterology

## 2021-06-29 VITALS — BP 147/77 | HR 58 | Temp 97.1°F | Resp 16 | Ht 69.5 in | Wt 223.0 lb

## 2021-06-29 DIAGNOSIS — K2950 Unspecified chronic gastritis without bleeding: Secondary | ICD-10-CM | POA: Diagnosis not present

## 2021-06-29 DIAGNOSIS — K219 Gastro-esophageal reflux disease without esophagitis: Secondary | ICD-10-CM

## 2021-06-29 DIAGNOSIS — K2970 Gastritis, unspecified, without bleeding: Secondary | ICD-10-CM

## 2021-06-29 DIAGNOSIS — K449 Diaphragmatic hernia without obstruction or gangrene: Secondary | ICD-10-CM

## 2021-06-29 DIAGNOSIS — R1314 Dysphagia, pharyngoesophageal phase: Secondary | ICD-10-CM

## 2021-06-29 DIAGNOSIS — K2270 Barrett's esophagus without dysplasia: Secondary | ICD-10-CM | POA: Diagnosis not present

## 2021-06-29 DIAGNOSIS — K634 Enteroptosis: Secondary | ICD-10-CM | POA: Diagnosis not present

## 2021-06-29 DIAGNOSIS — R1310 Dysphagia, unspecified: Secondary | ICD-10-CM | POA: Diagnosis not present

## 2021-06-29 MED ORDER — SODIUM CHLORIDE 0.9 % IV SOLN: 500.0000 mL | Freq: Once | INTRAVENOUS | Status: DC

## 2021-06-29 NOTE — Progress Notes (Signed)
History and Physical Interval Note:  06/29/2021 8:49 AM  [name]  has presented today for endoscopic procedure(s), with the diagnosis of  Encounter Diagnosis  Name Primary?   Pharyngoesophageal dysphagia Yes  Nausea, weight loss, globus sensation    The various methods of evaluation and treatment have been discussed with the patient and/or family. After consideration of risks, benefits and other options for treatment, the patient has consented to  the endoscopic procedure(s).   The patient's history has been reviewed, patient examined, no change in status, stable for endoscopic procedure(s).  I have reviewed the patient's chart and labs.  Questions were answered to the patient's satisfaction.     Azekiel Cremer E. Candis Schatz, MD [facility]

## 2021-06-29 NOTE — Progress Notes (Signed)
Called to room to assist during endoscopic procedure.  Patient ID and intended procedure confirmed with present staff. Received instructions for my participation in the procedure from the performing physician.  

## 2021-06-29 NOTE — Progress Notes (Signed)
Pt in recovery with monitors in place, VSS. Report given to receiving RN. Bite guard was placed with pt awake to ensure comfort. No dental or soft tissue damage noted. 

## 2021-06-29 NOTE — Op Note (Signed)
[name] Patient Name: Marcus Gomez Procedure Date: 06/29/2021 8:48 AM MRN: [id number] Endoscopist: Nicki Reaper E. Candis Schatz , MD Age: 76 Referring MD:  Date of Birth: Dec 10, 1945 Gender: Male Account #: [id number] Procedure:                Upper GI endoscopy Indications:              Globus sensation, Nausea, Weight loss Medicines:                Monitored Anesthesia Care Procedure:                Pre-Anesthesia Assessment:                           - Prior to the procedure, a History and Physical                            was performed, and patient medications and                            allergies were reviewed. The patient's tolerance of                            previous anesthesia was also reviewed. The risks                            and benefits of the procedure and the sedation                            options and risks were discussed with the patient.                            All questions were answered, and informed consent                            was obtained. Prior Anticoagulants: The patient has                            taken no previous anticoagulant or antiplatelet                            agents. ASA Grade Assessment: II - A patient with                            mild systemic disease. After reviewing the risks                            and benefits, the patient was deemed in                            satisfactory condition to undergo the procedure.                           After obtaining informed consent, the endoscope was  passed under direct vision. Throughout the                            procedure, the patient's blood pressure, pulse, and                            oxygen saturations were monitored continuously. The                            Endoscope was introduced through the mouth, and                            advanced to the third part of duodenum. The upper                            GI endoscopy  was accomplished without difficulty.                            The patient tolerated the procedure well. Scope In: Scope Out: Findings:                 The examined portions of the nasopharynx,                            oropharynx and larynx were normal.                           There were esophageal mucosal changes classified as                            Barrett's stage C1-M4 per Prague criteria present                            in the lower third of the esophagus. The maximum                            longitudinal extent of these mucosal changes was 4                            cm in length. Mucosa was biopsied with a cold                            forceps for histology in 4 quadrants at intervals                            of 1 cm at 34, 35 and 36 cm from the incisors. A                            total of 3 specimen bottles were sent to pathology.                            Estimated blood loss was minimal.  A 5 cm hiatal hernia was present.                           The entire examined stomach was normal. Biopsies                            were taken with a cold forceps for Helicobacter                            pylori testing. Estimated blood loss was minimal.                           The examined duodenum was normal. Complications:            No immediate complications. Estimated Blood Loss:     Estimated blood loss was minimal. Impression:               - The examined portions of the nasopharynx,                            oropharynx and larynx were normal.                           - Esophageal mucosal changes classified as                            Barrett's stage C1-M4 per Prague criteria. Biopsied.                           - 5 cm hiatal hernia.                           - Normal stomach. Biopsied.                           - Normal examined duodenum.                           - Suspect patient's symptoms are related to  GERD. Recommendation:           - Patient has a contact number available for                            emergencies. The signs and symptoms of potential                            delayed complications were discussed with the                            patient. Return to normal activities tomorrow.                            Written discharge instructions were provided to the                            patient.                           -  Resume previous diet.                           - Continue present medications.                           - Await pathology results.                           - Repeat upper endoscopy (date not yet determined)                            for surveillance based on pathology results. Isidra Mings E. Candis Schatz, MD 06/29/2021 9:20:32 AM This report has been signed electronically.

## 2021-06-29 NOTE — Patient Instructions (Addendum)
Read all of the handouts given to you by your recovery room nurse.  It is critical that you take your omeprazole regularly.  YOU HAD AN ENDOSCOPIC PROCEDURE TODAY AT [facility]:   Refer to the procedure report that was given to you for any specific questions about what was found during the examination.  If the procedure report does not answer your questions, please call your gastroenterologist to clarify.  If you requested that your care partner not be given the details of your procedure findings, then the procedure report has been included in a sealed envelope for you to review at your convenience later.  YOU SHOULD EXPECT: Some feelings of bloating in the abdomen. Passage of more gas than usual.  Walking can help get rid of the air that was put into your GI tract during the procedure and reduce the bloating.   Please Note:  You might notice some irritation and congestion in your nose or some drainage.  This is from the oxygen used during your procedure.  There is no need for concern and it should clear up in a day or so.  SYMPTOMS TO REPORT IMMEDIATELY:   Following upper endoscopy (EGD)  Vomiting of blood or coffee ground material  New chest pain or pain under the shoulder blades  Painful or persistently difficult swallowing  New shortness of breath  Fever of 100F or higher  Black, tarry-looking stools  For urgent or emergent issues, a gastroenterologist can be reached at any hour by calling [phone]. Do not use MyChart messaging for urgent concerns.    DIET:  We do recommend a small meal at first, but then you may proceed to your regular diet.  Drink plenty of fluids but you should avoid alcoholic beverages for 24 hours.  ACTIVITY:  You should plan to take it easy for the rest of today and you should NOT DRIVE or use heavy machinery until tomorrow (because of the sedation medicines used during the test).    FOLLOW UP: Our staff will call the number listed on  your records 24-72 hours following your procedure to check on you and address any questions or concerns that you may have regarding the information given to you following your procedure. If we do not reach you, we will leave a message.  We will attempt to reach you two times.  During this call, we will ask if you have developed any symptoms of COVID 19. If you develop any symptoms (ie: fever, flu-like symptoms, shortness of breath, cough etc.) before then, please call [phone].  If you test positive for Covid 19 in the 2 weeks post procedure, please call and report this information to [place].    If any biopsies were taken you will be contacted by phone or by letter within the next 1-3 weeks.  Please call us at [phone] if you have not heard about the biopsies in 3 weeks.    SIGNATURES/CONFIDENTIALITY: You and/or your care partner have signed paperwork which will be entered into your electronic medical record.  These signatures attest to the fact that that the information above on your After Visit Summary has been reviewed and is understood.  Full responsibility of the confidentiality of this discharge information lies with you and/or your care-partner.

## 2021-06-30 ENCOUNTER — Telehealth: Payer: Self-pay | Admitting: *Deleted

## 2021-06-30 NOTE — Telephone Encounter (Signed)
  Follow up Call-     06/29/2021    7:37 AM  Call back number  Post procedure Call Back phone  # [phone]  [id number] to leave phone message Yes     Patient questions:  Do you have a fever, pain , or abdominal swelling? No. Pain Score  0 *  Have you tolerated food without any problems? Yes.    Have you been able to return to your normal activities? Yes.    Do you have any questions about your discharge instructions: Diet   No. Medications  No. Follow up visit  No.  Do you have questions or concerns about your Care? No.  Actions: * If pain score is 4 or above: No action needed, pain <4.

## 2021-07-02 ENCOUNTER — Telehealth: Payer: Self-pay

## 2021-07-02 NOTE — Telephone Encounter (Signed)
Patient has been made aware of lab results. No further questions.

## 2021-07-04 NOTE — Telephone Encounter (Signed)
Received a fax regarding Prior Authorization from [facility] for ONDANSETRON 4MG . Authorization has been DENIED because it does not meet the "medically-accepted indication." The FDA-approved indication for Ondansetron: Chemotherapy or Radiation induced nausea/vomiting or Prophylaxis of Post-surgical nausea/vomiting.

## 2021-07-07 ENCOUNTER — Ambulatory Visit: Payer: PPO | Admitting: Allergy

## 2021-07-09 DIAGNOSIS — M19042 Primary osteoarthritis, left hand: Secondary | ICD-10-CM | POA: Diagnosis not present

## 2021-07-15 ENCOUNTER — Ambulatory Visit (INDEPENDENT_AMBULATORY_CARE_PROVIDER_SITE_OTHER): Payer: PPO

## 2021-07-15 DIAGNOSIS — J309 Allergic rhinitis, unspecified: Secondary | ICD-10-CM

## 2021-07-23 ENCOUNTER — Other Ambulatory Visit: Payer: Self-pay | Admitting: Adult Health

## 2021-08-02 ENCOUNTER — Ambulatory Visit (INDEPENDENT_AMBULATORY_CARE_PROVIDER_SITE_OTHER): Payer: PPO

## 2021-08-02 DIAGNOSIS — J309 Allergic rhinitis, unspecified: Secondary | ICD-10-CM

## 2021-08-23 ENCOUNTER — Ambulatory Visit (INDEPENDENT_AMBULATORY_CARE_PROVIDER_SITE_OTHER): Payer: PPO

## 2021-08-23 DIAGNOSIS — J309 Allergic rhinitis, unspecified: Secondary | ICD-10-CM

## 2021-08-25 DIAGNOSIS — J3089 Other allergic rhinitis: Secondary | ICD-10-CM | POA: Diagnosis not present

## 2021-08-25 NOTE — Progress Notes (Signed)
VIALS EXP 08-26-22

## 2021-08-26 DIAGNOSIS — J3081 Allergic rhinitis due to animal (cat) (dog) hair and dander: Secondary | ICD-10-CM

## 2021-09-09 ENCOUNTER — Other Ambulatory Visit: Payer: Self-pay | Admitting: Adult Health

## 2021-09-09 DIAGNOSIS — I10 Essential (primary) hypertension: Secondary | ICD-10-CM

## 2021-09-21 ENCOUNTER — Ambulatory Visit (INDEPENDENT_AMBULATORY_CARE_PROVIDER_SITE_OTHER): Payer: PPO | Admitting: *Deleted

## 2021-09-21 ENCOUNTER — Telehealth: Payer: Self-pay | Admitting: Pharmacist

## 2021-09-21 DIAGNOSIS — J309 Allergic rhinitis, unspecified: Secondary | ICD-10-CM | POA: Diagnosis not present

## 2021-09-21 NOTE — Chronic Care Management (AMB) (Signed)
Chronic Care Management Pharmacy Assistant   Name: Marcus Gomez.  MRN: [id number] DOB: [birth year]  Reason for Encounter: Disease State   Conditions to be addressed/monitored: HTN  Recent office visits:  None  Recent consult visits:  09/21/21 Derl Barrow, CMA - Patient presented for Allergy Injections.  08/23/21 [name], LPN - Patient presented for Allergy Injections.  08/02/21 Larence Penning, CMA - Patient presented for Allergy Injections.  08/02/21 Garnet Sierras - Claims encounter for Allergic rhinitis. No other visit details available.  07/15/21 Julius Bowels, CMA - Patient presented for Allergy Injections.  07/09/21  - Patient presented for Ortho XR of Hand 3 or more VW Unilateral. No medication changes.  07/09/21 Dasnoit, Demetrios Loll, PA-C (Ortho Surg) - Patient presented for Primary Osteoarthritis of left hand. No other visit details available.  06/28/21 Garnet Sierras, DO (Allergy) - Patient presented for Perennial and seasonal allergic rhinitis and other concerns. Increased Omeprazole.  06/25/21 Vladimir Crofts, PA-C Gertie Fey) - Patient presented for Pharyngoesophageal dysphagia and other concerns. Prescribed Ondansetron. Stopped Propylene Glycol. Stopped Sucralfate.  06/22/21 Julius Bowels, CMA - Patient presented for Allergy Injections.  06/03/21 Julius Bowels, CMA - Patient presented for Allergy Injections.  05/26/21 Dennis, [place] encounter for Allergic rhinitis. No other visit details available.  05/24/21 Puschinsky, Fransico Him (Urology) - Claims encounter for Malignant neoplasm of prostate. No other visit details available.  05/19/21 Isabel Caprice, [place] - Patient presented for Allergy Injections.  05/13/21 Julius Bowels, CMA - Patient presented for Allergy Injections.  05/06/21 Julius Bowels, CMA - Patient presented for Allergy Injections.  04/28/21 Garnet Sierras, DO (Allergy) - Patient presented for  Perennial and seasonal allergic rhinitis and other concerns. Prescribed Opopatadine-Mometasone. Changed Montelukast Sodium. Increased Omeprazole. Stopped Cyclobenzaprine. [facility] visits:  Medication Reconciliation was completed by comparing discharge summary, patient's EMR and Pharmacy list, and upon discussion with patient.  Patient presented to [facility] ED on 04/26/21 due to Abdominal Pain. Patient was present for 3 hours.  New?Medications Started at [facility] Discharge:?? -started  sucralfate 1 g tablet  Medication Changes at Hospital Discharge: -Changed  omeprazole 20 MG capsule  Medications Discontinued at Hospital Discharge: -Stopped  none  Medications that remain the same after Hospital Discharge:??  -All other medications will remain the same.    Medications: Outpatient Encounter Medications as of 09/21/2021  Medication Sig Note   AMBULATORY NON FORMULARY MEDICATION Allergy Shots Every 3 weeks    Carboxymethylcellulose Sodium (LUBRICATING PLUS EYE DROPS OP) Apply 1 drop to eye 2 (two) times daily.    celecoxib (CELEBREX) 200 MG capsule Take 200 mg by mouth daily.    EPINEPHrine 0.3 mg/0.3 mL IJ SOAJ injection Inject 0.3 mLs (0.3 mg total) into the muscle once as needed for up to 1 dose for anaphylaxis. 06/25/2021: On hand   hydrochlorothiazide (HYDRODIURIL) 25 MG tablet Take 1 tablet by mouth once daily    losartan (COZAAR) 100 MG tablet Take 1 tablet by mouth once daily    meclizine (ANTIVERT) 25 MG tablet Take 25 mg by mouth 3 (three) times daily as needed. 06/25/2021: On hand   montelukast (SINGULAIR) 10 MG tablet Take by mouth.    Multiple Vitamin (MULTIVITAMIN) tablet Take 1 tablet by mouth daily.    Multiple Vitamins-Minerals (PRESERVISION AREDS 2+MULTI VIT PO) Take 1 capsule by mouth in the morning and at bedtime.    Olopatadine-Mometasone (RYALTRIS)  573-22 MCG/ACT SUSP Place 1-2 sprays into the nose in the morning and at bedtime.     omeprazole (PRILOSEC) 40 MG capsule Take 1 capsule (40 mg total) by mouth daily. (Patient taking differently: Take 40 mg by mouth as needed.)    rosuvastatin (CRESTOR) 5 MG tablet TAKE 1 TABLET BY MOUTH EVERY OTHER DAY    Turmeric (QC TUMERIC COMPLEX PO) Take 1,000 mg by mouth in the morning and at bedtime.    No facility-administered encounter medications on file as of 09/21/2021.   Reviewed chart prior to disease state call. Spoke with patient regarding BP  Recent Office Vitals: BP Readings from Last 3 Encounters:  06/29/21 (!) 147/77  06/28/21 122/68  06/25/21 120/60   Pulse Readings from Last 3 Encounters:  06/29/21 (!) 58  06/28/21 82  06/25/21 76    Wt Readings from Last 3 Encounters:  06/29/21 223 lb (101.2 kg)  06/28/21 227 lb (103 kg)  06/25/21 223 lb 2 oz (101.2 kg)     Kidney Function Lab Results  Component Value Date/Time   CREATININE 0.91 06/25/2021 10:07 AM   CREATININE 1.33 (H) 04/26/2021 01:20 PM   CREATININE 0.88 07/24/2019 08:23 AM   GFR 81.98 06/25/2021 10:07 AM   GFRNONAA 55 (L) 04/26/2021 01:20 PM   GFRAA 85 (L) 12/09/2012 01:15 PM       Latest Ref Rng & Units 06/25/2021   10:07 AM 04/26/2021    1:20 PM 10/29/2020    8:57 AM  BMP  Glucose 70 - 99 mg/dL 99  119  94   BUN 6 - 23 mg/dL '13  18  18   '$ Creatinine 0.40 - 1.50 mg/dL 0.91  1.33  0.97   Sodium 135 - 145 mEq/L 137  130  133   Potassium 3.5 - 5.1 mEq/L 4.0  4.0  4.1   Chloride 96 - 112 mEq/L 102  99  102   CO2 19 - 32 mEq/L '26  23  23   '$ Calcium 8.4 - 10.5 mg/dL 9.6  9.3  9.3     Current antihypertensive regimen:  Losartan 100 mg 1 tablet daily - in AM  HCTZ - 25 mg How often are you checking your Blood Pressure? weekly Current home BP readings: 131/70,136/71 He denies any hypertensive symptoms. What recent interventions/DTPs have been made by any provider to improve Blood Pressure control since last CPP Visit: Patient reports none Any recent hospitalizations or ED visits since last  visit with CPP? 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 He reports he had some more elevated readings with his pressures a few weeks back with it in the low 140's and 70's on the bottom. Went over with him proper technique of being seated feet on floor uncrossed and arm at heart level on desk or table when checking. He reports he does check an hour or so after having his morning medications and does not have anything to eat or drink 15 min prior. He reports he will make a note of it the next time its higher and recheck later in the day also and notate reading.  Adherence Review: Is the patient currently on ACE/ARB medication? Yes Does the patient have >5 day gap between last estimated fill dates? No     Care Gaps: Zoster Vacccine - Overdue COVID  Booster - Overdue Flu Vaccine  - Overdue TDAP - Postponed BP- 131/70 09/22/21 AWV- 1/23  CCM- Declined at this time   Star Rating Drugs: Losartan 100 mg - Last filled 09/10/21 90 DS at [facility] Rosuvastatin 5 mg - Last filled 07/27/21 90 DS at [place] Pharmacist Assistant [phone]

## 2021-09-24 ENCOUNTER — Encounter (INDEPENDENT_AMBULATORY_CARE_PROVIDER_SITE_OTHER): Payer: PPO | Admitting: Ophthalmology

## 2021-09-24 DIAGNOSIS — I10 Essential (primary) hypertension: Secondary | ICD-10-CM | POA: Diagnosis not present

## 2021-09-24 DIAGNOSIS — H43813 Vitreous degeneration, bilateral: Secondary | ICD-10-CM | POA: Diagnosis not present

## 2021-09-24 DIAGNOSIS — H35033 Hypertensive retinopathy, bilateral: Secondary | ICD-10-CM | POA: Diagnosis not present

## 2021-09-24 DIAGNOSIS — H353132 Nonexudative age-related macular degeneration, bilateral, intermediate dry stage: Secondary | ICD-10-CM | POA: Diagnosis not present

## 2021-09-27 ENCOUNTER — Other Ambulatory Visit: Payer: Self-pay | Admitting: Adult Health

## 2021-09-27 DIAGNOSIS — I10 Essential (primary) hypertension: Secondary | ICD-10-CM

## 2021-09-29 ENCOUNTER — Telehealth: Payer: Self-pay | Admitting: Allergy

## 2021-09-29 ENCOUNTER — Other Ambulatory Visit: Payer: Self-pay | Admitting: Allergy

## 2021-09-29 ENCOUNTER — Other Ambulatory Visit: Payer: Self-pay | Admitting: *Deleted

## 2021-09-29 MED ORDER — OMEPRAZOLE 40 MG PO CPDR: 40.0000 mg | DELAYED_RELEASE_CAPSULE | Freq: Every day | ORAL | 5 refills | Status: DC | PRN

## 2021-09-29 NOTE — Telephone Encounter (Signed)
Pt is calling to say the pharmacy is still telling him they have not heard from NP for the prior authorization...   hydrochlorothiazide (HYDRODIURIL) 25 MG tablet  Pt has now been out of these meds since Sunday.  LOV:  10/29/2020 Pt last spoke to Pharmacist on 02/22/21  Please advise.  [place] [address] [facility], [place] Phone:  [phone]  Fax:  [phone]

## 2021-09-29 NOTE — Telephone Encounter (Signed)
Refills have been sent in. Called patient and advised, patient verbalized understanding.  

## 2021-09-29 NOTE — Telephone Encounter (Signed)
Marcus Gomez called in and states he needs refills on Omeprazole and would like that called in to [facility] in [place]

## 2021-10-18 ENCOUNTER — Ambulatory Visit (INDEPENDENT_AMBULATORY_CARE_PROVIDER_SITE_OTHER): Payer: PPO

## 2021-10-18 DIAGNOSIS — J309 Allergic rhinitis, unspecified: Secondary | ICD-10-CM | POA: Diagnosis not present

## 2021-10-20 ENCOUNTER — Ambulatory Visit (INDEPENDENT_AMBULATORY_CARE_PROVIDER_SITE_OTHER): Payer: PPO | Admitting: Adult Health

## 2021-10-20 ENCOUNTER — Encounter: Payer: Self-pay | Admitting: Adult Health

## 2021-10-20 VITALS — BP 130/68 | HR 61 | Temp 98.6°F | Ht 70.0 in | Wt 227.0 lb

## 2021-10-20 DIAGNOSIS — Z23 Encounter for immunization: Secondary | ICD-10-CM

## 2021-10-20 DIAGNOSIS — K219 Gastro-esophageal reflux disease without esophagitis: Secondary | ICD-10-CM | POA: Diagnosis not present

## 2021-10-20 DIAGNOSIS — J3089 Other allergic rhinitis: Secondary | ICD-10-CM | POA: Diagnosis not present

## 2021-10-20 DIAGNOSIS — Z8546 Personal history of malignant neoplasm of prostate: Secondary | ICD-10-CM

## 2021-10-20 DIAGNOSIS — Z0000 Encounter for general adult medical examination without abnormal findings: Secondary | ICD-10-CM | POA: Diagnosis not present

## 2021-10-20 DIAGNOSIS — E782 Mixed hyperlipidemia: Secondary | ICD-10-CM | POA: Diagnosis not present

## 2021-10-20 DIAGNOSIS — I10 Essential (primary) hypertension: Secondary | ICD-10-CM

## 2021-10-20 LAB — COMPREHENSIVE METABOLIC PANEL
ALT: 18 U/L (ref 0–53)
AST: 19 U/L (ref 0–37)
Albumin: 4.2 g/dL (ref 3.5–5.2)
Alkaline Phosphatase: 72 U/L (ref 39–117)
BUN: 15 mg/dL (ref 6–23)
CO2: 26 mEq/L (ref 19–32)
Calcium: 9.5 mg/dL (ref 8.4–10.5)
Chloride: 101 mEq/L (ref 96–112)
Creatinine, Ser: 0.88 mg/dL (ref 0.40–1.50)
GFR: 83.45 mL/min (ref >60.00)
Glucose, Bld: 85 mg/dL (ref 70–99)
Potassium: 4.2 mEq/L (ref 3.5–5.1)
Sodium: 136 mEq/L (ref 135–145)
Total Bilirubin: 1.3 mg/dL — ABNORMAL HIGH (ref 0.2–1.2)
Total Protein: 7 g/dL (ref 6.0–8.3)

## 2021-10-20 LAB — CBC WITH DIFFERENTIAL/PLATELET
Basophils Absolute: 0 10*3/uL (ref 0.0–0.1)
Basophils Relative: 0.5 % (ref 0.0–3.0)
Eosinophils Absolute: 0.1 10*3/uL (ref 0.0–0.7)
Eosinophils Relative: 0.7 % (ref 0.0–5.0)
HCT: 39.3 % (ref 39.0–52.0)
Hemoglobin: 13.2 g/dL (ref 13.0–17.0)
Lymphocytes Relative: 27.9 % (ref 12.0–46.0)
Lymphs Abs: 2.1 10*3/uL (ref 0.7–4.0)
MCHC: 33.7 g/dL (ref 30.0–36.0)
MCV: 87.9 fl (ref 78.0–100.0)
Monocytes Absolute: 0.7 10*3/uL (ref 0.1–1.0)
Monocytes Relative: 9 % (ref 3.0–12.0)
Neutro Abs: 4.6 10*3/uL (ref 1.4–7.7)
Neutrophils Relative %: 61.9 % (ref 43.0–77.0)
Platelets: 174 10*3/uL (ref 150.0–400.0)
RBC: 4.47 Mil/uL (ref 4.22–5.81)
RDW: 13.6 % (ref 11.5–15.5)
WBC: 7.4 10*3/uL (ref 4.0–10.5)

## 2021-10-20 LAB — LIPID PANEL
Cholesterol: 141 mg/dL (ref 0–200)
HDL: 45 mg/dL (ref >39.00)
LDL Cholesterol: 76 mg/dL (ref 0–99)
NonHDL: 95.8
Total CHOL/HDL Ratio: 3
Triglycerides: 97 mg/dL (ref 0.0–149.0)
VLDL: 19.4 mg/dL (ref 0.0–40.0)

## 2021-10-20 LAB — TSH: TSH: 1.87 u[IU]/mL (ref 0.35–5.50)

## 2021-10-20 MED ORDER — HYDROCHLOROTHIAZIDE 25 MG PO TABS: 25.0000 mg | ORAL_TABLET | Freq: Every day | ORAL | 3 refills | Status: DC

## 2021-10-20 MED ORDER — ROSUVASTATIN CALCIUM 5 MG PO TABS: 5.0000 mg | ORAL_TABLET | ORAL | 3 refills | Status: DC

## 2021-10-20 NOTE — Patient Instructions (Signed)
It was great seeing you today   We will follow up with you regarding your lab work   Please let me know if you need anything   Use benafiber or metamucil

## 2021-10-20 NOTE — Progress Notes (Signed)
Subjective:    Patient ID: Marcus Gomez., male    DOB: 1945/04/01, 76 y.o.   MRN: [id number]  HPI Patient presents for yearly preventative medicine examination. He is a pleasant 76 year old male who  has a past medical history of Allergies, Allergy, Aortic atherosclerosis ([place]), Arthritis, Cataract, Diverticulosis, Essential hypertension, GERD (gastroesophageal reflux disease), Prostate cancer ([place]), and Spondylosis.  Hypertension-managed with Cozaar 100 mg daily and HCTZ 25 mg daily. He does not monitor his blood pressures at home on a routine basis.  Denies dizziness, lightheadedness, chest pain, or shortness of breath BP Readings from Last 3 Encounters:  10/20/21 130/68  06/29/21 (!) 147/77  06/28/21 122/68   Hyperlipidemia-managed with Crestor 5 mg every other day.  He denies myalgia or fatigue Lab Results  Component Value Date   CHOL 148 07/24/2020   HDL 51.50 07/24/2020   LDLCALC 77 07/24/2020   TRIG 93.0 07/24/2020   CHOLHDL 3 07/24/2020   History of prostate cancer-has been followed by urology every 6 months. Has been cancer free for 21 years.   Seasonal Allergies - managed by allergy and asthma.  Receives immunotherapy injections and takes singular nightly.  GERD-takes Prilosec 20 mg daily   All immunizations and health maintenance protocols were reviewed with the patient and needed orders were placed.  Appropriate screening laboratory values were ordered for the patient including screening of hyperlipidemia, renal function and hepatic function. If indicated by BPH, a PSA was ordered.  Medication reconciliation,  past medical history, social history, problem list and allergies were reviewed in detail with the patient  Goals were established with regard to weight loss, exercise, and  diet in compliance with medications Wt Readings from Last 3 Encounters:  10/20/21 227 lb (103 kg)  06/29/21 223 lb (101.2 kg)  06/28/21 227 lb (103 kg)   He is  up-to-date on routine colon cancer screening  He has no acute complaints   Review of Systems  Constitutional: Negative.   HENT: Negative.    Eyes: Negative.   Respiratory: Negative.    Cardiovascular: Negative.   Gastrointestinal: Negative.   Endocrine: Negative.   Genitourinary: Negative.   Musculoskeletal:  Positive for arthralgias (chronic knee pain).  Skin: Negative.   Allergic/Immunologic: Negative.   Neurological: Negative.   Hematological: Negative.   Psychiatric/Behavioral: Negative.    All other systems reviewed and are negative.  Past Medical History:  Diagnosis Date   Allergies    Allergy    Aortic atherosclerosis ([facility])    Arthritis    Cataract    Diverticulosis    Essential hypertension    GERD (gastroesophageal reflux disease)    Prostate cancer ([place])    Remission   Spondylosis     Social History   Socioeconomic History   Marital status: Divorced    Spouse name: Not on file   Number of children: 0   Years of education: Not on file   Highest education level: Not on file  Occupational History   Occupation: retired  Tobacco Use   Smoking status: Never   Smokeless tobacco: Never  Vaping Use   Vaping Use: Never used  Substance and Sexual Activity   Alcohol use: Not Currently    Comment: none in 2 years 06/25/2021   Drug use: No   Sexual activity: Yes  Other Topics Concern   Not on file  Social History Narrative   Retired - Biomedical scientist    Divorced x 4    Two children -  2 girls both live local.       Social Determinants of Health   Financial Resource Strain: Low Risk  (01/28/2021)   Overall Financial Resource Strain (CARDIA)    Difficulty of Paying Living Expenses: Not hard at all  Food Insecurity: No Food Insecurity (01/28/2021)   Hunger Vital Sign    Worried About Running Out of Food in the Last Year: Never true    Ran Out of Food in the Last Year: Never true  Transportation Needs: No Transportation Needs (01/28/2021)   PRAPARE -  Hydrologist (Medical): No    Lack of Transportation (Non-Medical): No  Physical Activity: Sufficiently Active (01/28/2021)   Exercise Vital Sign    Days of Exercise per Week: 7 days    Minutes of Exercise per Session: 30 min  Stress: No Stress Concern Present (01/28/2021)   [place]    Feeling of Stress : Not at all  Social Connections: Moderately Isolated (01/28/2021)   Social Connection and Isolation Panel [NHANES]    Frequency of Communication with Friends and Family: More than three times a week    Frequency of Social Gatherings with Friends and Family: More than three times a week    Attends Religious Services: Never    Marine scientist or Organizations: Yes    Attends Music therapist: More than 4 times per year    Marital Status: Divorced  Intimate Partner Violence: Not At Risk (01/28/2021)   Humiliation, Afraid, Rape, and Kick questionnaire    Fear of Current or Ex-Partner: No    Emotionally Abused: No    Physically Abused: No    Sexually Abused: No    Past Surgical History:  Procedure Laterality Date   KNEE SURGERY Bilateral 2015 & 2014   SHOULDER SURGERY Right 2016   TONSILLECTOMY      Family History  Problem Relation Age of Onset   Stroke Mother    Dementia Mother    Hypotension Mother    Allergies Father    COPD Father        never smoker   Rheum arthritis Father    Glaucoma Father    Glaucoma Paternal Grandmother    Allergic rhinitis Neg Hx    Angioedema Neg Hx    Asthma Neg Hx    Eczema Neg Hx    Immunodeficiency Neg Hx    Urticaria Neg Hx    Colon cancer Neg Hx    Stomach cancer Neg Hx    Esophageal cancer Neg Hx     Allergies  Allergen Reactions   Lisinopril Cough    Current Outpatient Medications on File Prior to Visit  Medication Sig Dispense Refill   AMBULATORY NON FORMULARY MEDICATION Allergy Shots Every 3 weeks      Carboxymethylcellulose Sodium (LUBRICATING PLUS EYE DROPS OP) Apply 1 drop to eye 2 (two) times daily.     celecoxib (CELEBREX) 200 MG capsule Take 200 mg by mouth daily.     EPINEPHrine 0.3 mg/0.3 mL IJ SOAJ injection Inject 0.3 mLs (0.3 mg total) into the muscle once as needed for up to 1 dose for anaphylaxis. 0.3 mL 1   hydrochlorothiazide (HYDRODIURIL) 25 MG tablet Take 1 tablet by mouth once daily 30 tablet 0   losartan (COZAAR) 100 MG tablet Take 1 tablet by mouth once daily 90 tablet 0   montelukast (SINGULAIR) 10 MG tablet Take by mouth.  Multiple Vitamin (MULTIVITAMIN) tablet Take 1 tablet by mouth daily.     Multiple Vitamins-Minerals (PRESERVISION AREDS 2+MULTI VIT PO) Take 1 capsule by mouth in the morning and at bedtime.     Olopatadine-Mometasone (RYALTRIS) G7528004 MCG/ACT SUSP Place 1-2 sprays into the nose in the morning and at bedtime. 29 g 5   omeprazole (PRILOSEC) 40 MG capsule Take 1 capsule (40 mg total) by mouth daily as needed. 30 capsule 5   rosuvastatin (CRESTOR) 5 MG tablet TAKE 1 TABLET BY MOUTH EVERY OTHER DAY 45 tablet 0   Turmeric (QC TUMERIC COMPLEX PO) Take 1,000 mg by mouth in the morning and at bedtime.     meclizine (ANTIVERT) 25 MG tablet Take 25 mg by mouth 3 (three) times daily as needed. (Patient not taking: Reported on 10/20/2021)     No current facility-administered medications on file prior to visit.    BP 130/68   Pulse 61   Temp 98.6 F (37 C) (Oral)   Ht '5\' 10"'$  (1.778 m)   Wt 227 lb (103 kg)   SpO2 99%   BMI 32.57 kg/m       Objective:   Physical Exam Vitals and nursing note reviewed.  Constitutional:      General: He is not in acute distress.    Appearance: Normal appearance. He is well-developed and overweight.  HENT:     Head: Normocephalic and atraumatic.     Right Ear: Tympanic membrane, ear canal and external ear normal. There is no impacted cerumen.     Left Ear: Tympanic membrane, ear canal and external ear normal. There is  no impacted cerumen.     Nose: Nose normal. No congestion or rhinorrhea.     Mouth/Throat:     Mouth: Mucous membranes are moist.     Pharynx: Oropharynx is clear. No oropharyngeal exudate or posterior oropharyngeal erythema.  Eyes:     General:        Right eye: No discharge.        Left eye: No discharge.     Extraocular Movements: Extraocular movements intact.     Conjunctiva/sclera: Conjunctivae normal.     Pupils: Pupils are equal, round, and reactive to light.  Neck:     Vascular: No carotid bruit.     Trachea: No tracheal deviation.  Cardiovascular:     Rate and Rhythm: Normal rate and regular rhythm.     Pulses: Normal pulses.     Heart sounds: Normal heart sounds. No murmur heard.    No friction rub. No gallop.  Pulmonary:     Effort: Pulmonary effort is normal. No respiratory distress.     Breath sounds: Normal breath sounds. No stridor. No wheezing, rhonchi or rales.  Chest:     Chest wall: No tenderness.  Abdominal:     General: Bowel sounds are normal. There is no distension.     Palpations: Abdomen is soft. There is no mass.     Tenderness: There is no abdominal tenderness. There is no right CVA tenderness, left CVA tenderness, guarding or rebound.     Hernia: No hernia is present.  Musculoskeletal:        General: No swelling, tenderness, deformity or signs of injury. Normal range of motion.     Right lower leg: No edema.     Left lower leg: No edema.  Lymphadenopathy:     Cervical: No cervical adenopathy.  Skin:    General: Skin is warm and dry.  Capillary Refill: Capillary refill takes less than 2 seconds.     Coloration: Skin is not jaundiced or pale.     Findings: No bruising, erythema, lesion or rash.  Neurological:     General: No focal deficit present.     Mental Status: He is alert and oriented to person, place, and time.     Cranial Nerves: No cranial nerve deficit.     Sensory: No sensory deficit.     Motor: No weakness.     Coordination:  Coordination normal.     Gait: Gait normal.     Deep Tendon Reflexes: Reflexes normal.  Psychiatric:        Mood and Affect: Mood normal.        Behavior: Behavior normal.        Thought Content: Thought content normal.        Judgment: Judgment normal.       Assessment & Plan:  1. Routine general medical examination at a health care facility - Follow up in one year or sooner if needed - Work on weight loss through diet and exercise - CBC with Differential/Platelet; Future - Comprehensive metabolic panel; Future - Lipid panel; Future - TSH; Future  2. Essential hypertension - Well controlled. No change in medications - hydrochlorothiazide (HYDRODIURIL) 25 MG tablet; Take 1 tablet (25 mg total) by mouth daily.  Dispense: 90 tablet; Refill: 3 - CBC with Differential/Platelet; Future - Comprehensive metabolic panel; Future - Lipid panel; Future - TSH; Future  3. Mixed hyperlipidemia  - rosuvastatin (CRESTOR) 5 MG tablet; Take 1 tablet (5 mg total) by mouth every other day.  Dispense: 45 tablet; Refill: 3 - CBC with Differential/Platelet; Future - Comprehensive metabolic panel; Future - Lipid panel; Future - TSH; Future  4. History of prostate cancer  - CBC with Differential/Platelet; Future - Comprehensive metabolic panel; Future - Lipid panel; Future - TSH; Future  5. Gastroesophageal reflux disease, unspecified whether esophagitis present - Continue PPI  - CBC with Differential/Platelet; Future - Comprehensive metabolic panel; Future - Lipid panel; Future - TSH; Future  6. Perennial and seasonal allergic rhinitis - per allergy and asthma   7. Need for immunization against influenza  - Flu Vaccine QUAD High Dose(Fluad)  [name], NP

## 2021-10-31 NOTE — Progress Notes (Unsigned)
Follow Up Note  RE: Marcus Gomez. MRN: [id number] DOB: [birth year] Date of Office Visit: 11/01/2021  Referring provider: [name], NP Primary care provider: [name], NP  Chief Complaint: No chief complaint on file.  History of Present Illness: I had the pleasure of seeing Marcus Gomez for a follow up visit at the [facility] on 10/31/2021. He is a 76 y.o. male, who is being followed for allergic rhinitis on AIT, GERD. His previous allergy office visit was on 06/28/2021 with Dr. Maudie Mercury. Today is a regular follow up visit.  Perennial and seasonal allergic rhinitis Past history - 2019 skin testing was positive to grass, trees, mold, cat, dog, cockroach and dust mites. Started AIT on 11/12/2018 (G-T-DM-D & M-C-CR). Interim history - some voice hoarseness.   Continue environmental control measures. Continue allergy injections. May use Singulair '10mg'$  daily as needed.  Continue Ryaltris (olopatadine + mometasone nasal spray combination) 1-2 sprays per nostril twice a day.  Nasal saline spray (i.e., Simply Saline) or nasal saline lavage (i.e., NeilMed) is recommended as needed and prior to medicated nasal sprays.   GERD (gastroesophageal reflux disease) Has EGD scheduled for tomorrow.  Continue appropriate reflux lifestyle modifications. Continue omeprazole '40mg'$  daily in the mornings if needed. No food/drink for 30 minutes afterwards.  Follow up with your GI as scheduled.   06/29/2021 EGD:  Biopsy results showed: Barrett's esophagus.   Assessment and Plan: Marcus Gomez is a 76 y.o. male with: No problem-specific Assessment & Plan notes found for this encounter.  No follow-ups on file.  No orders of the defined types were placed in this encounter.  Lab Orders  No laboratory test(s) ordered today    Diagnostics: Spirometry:  Tracings reviewed. His effort: {Blank single:19197::"Good reproducible efforts.","It was hard to get consistent  efforts and there is a question as to whether this reflects a maximal maneuver.","Poor effort, data can not be interpreted."} FVC: ***L FEV1: ***L, ***% predicted FEV1/FVC ratio: ***% Interpretation: {Blank single:19197::"Spirometry consistent with mild obstructive disease","Spirometry consistent with moderate obstructive disease","Spirometry consistent with severe obstructive disease","Spirometry consistent with possible restrictive disease","Spirometry consistent with mixed obstructive and restrictive disease","Spirometry uninterpretable due to technique","Spirometry consistent with normal pattern","No overt abnormalities noted given today's efforts"}.  Please see scanned spirometry results for details.  Skin Testing: {Blank single:19197::"Select foods","Environmental allergy panel","Environmental allergy panel and select foods","Food allergy panel","None","Deferred due to recent antihistamines use"}. *** Results discussed with patient/family.   Medication List:  Current Outpatient Medications  Medication Sig Dispense Refill   AMBULATORY NON FORMULARY MEDICATION Allergy Shots Every 3 weeks     Carboxymethylcellulose Sodium (LUBRICATING PLUS EYE DROPS OP) Apply 1 drop to eye 2 (two) times daily.     celecoxib (CELEBREX) 200 MG capsule Take 200 mg by mouth daily.     EPINEPHrine 0.3 mg/0.3 mL IJ SOAJ injection Inject 0.3 mLs (0.3 mg total) into the muscle once as needed for up to 1 dose for anaphylaxis. 0.3 mL 1   hydrochlorothiazide (HYDRODIURIL) 25 MG tablet Take 1 tablet (25 mg total) by mouth daily. 90 tablet 3   losartan (COZAAR) 100 MG tablet Take 1 tablet by mouth once daily 90 tablet 0   montelukast (SINGULAIR) 10 MG tablet Take by mouth.     Multiple Vitamin (MULTIVITAMIN) tablet Take 1 tablet by mouth daily.     Multiple Vitamins-Minerals (PRESERVISION AREDS 2+MULTI VIT PO) Take 1 capsule by mouth in the morning and at bedtime.     Olopatadine-Mometasone (RYALTRIS) G7528004 MCG/ACT  SUSP Place 1-2 sprays into the nose in the morning and at bedtime. 29 g 5   omeprazole (PRILOSEC) 40 MG capsule Take 1 capsule (40 mg total) by mouth daily as needed. 30 capsule 5   rosuvastatin (CRESTOR) 5 MG tablet Take 1 tablet (5 mg total) by mouth every other day. 45 tablet 3   Turmeric (QC TUMERIC COMPLEX PO) Take 1,000 mg by mouth in the morning and at bedtime.     No current facility-administered medications for this visit.   Allergies: Allergies  Allergen Reactions   Lisinopril Cough   I reviewed his past medical history, social history, family history, and environmental history and no significant changes have been reported from his previous visit.  Review of Systems  Constitutional:  Negative for appetite change, chills, fever and unexpected weight change.  HENT:  Positive for congestion and voice change. Negative for postnasal drip and rhinorrhea.   Eyes:  Negative for itching.  Respiratory:  Negative for cough, chest tightness, shortness of breath and wheezing.   Gastrointestinal:  Negative for abdominal pain.  Skin:  Negative for rash.  Allergic/Immunologic: Positive for environmental allergies.  Neurological:  Negative for dizziness and headaches.    Objective: There were no vitals taken for this visit. There is no height or weight on file to calculate BMI. Physical Exam Vitals and nursing note reviewed.  Constitutional:      Appearance: Normal appearance. He is well-developed.  HENT:     Head: Normocephalic and atraumatic.     Right Ear: Tympanic membrane and external ear normal.     Left Ear: Tympanic membrane and external ear normal.     Nose: Nose normal.     Mouth/Throat:     Mouth: Mucous membranes are moist.     Pharynx: Oropharynx is clear.  Eyes:     Conjunctiva/sclera: Conjunctivae normal.  Cardiovascular:     Rate and Rhythm: Normal rate and regular rhythm.     Heart sounds: Normal heart sounds. No murmur heard. Pulmonary:     Effort: Pulmonary  effort is normal.     Breath sounds: Normal breath sounds. No wheezing, rhonchi or rales.  Musculoskeletal:     Cervical back: Neck supple.  Skin:    General: Skin is warm.     Findings: No rash.  Neurological:     Mental Status: He is alert and oriented to person, place, and time.  Psychiatric:        Behavior: Behavior normal.    Previous notes and tests were reviewed. The plan was reviewed with the patient/family, and all questions/concerned were addressed.  It was my pleasure to see Mcguire today and participate in his care. Please feel free to contact me with any questions or concerns.  Sincerely,  Rexene Alberts, DO Allergy & Immunology  [facility] office: [place] office: [phone]

## 2021-11-01 ENCOUNTER — Ambulatory Visit (INDEPENDENT_AMBULATORY_CARE_PROVIDER_SITE_OTHER): Payer: PPO | Admitting: Allergy

## 2021-11-01 ENCOUNTER — Telehealth: Payer: Self-pay | Admitting: *Deleted

## 2021-11-01 ENCOUNTER — Encounter: Payer: Self-pay | Admitting: Allergy

## 2021-11-01 VITALS — BP 126/78 | HR 78 | Temp 97.7°F | Resp 18 | Ht 68.5 in | Wt 230.2 lb

## 2021-11-01 DIAGNOSIS — K219 Gastro-esophageal reflux disease without esophagitis: Secondary | ICD-10-CM

## 2021-11-01 DIAGNOSIS — J3089 Other allergic rhinitis: Secondary | ICD-10-CM | POA: Diagnosis not present

## 2021-11-01 MED ORDER — MONTELUKAST SODIUM 10 MG PO TABS: 10.0000 mg | ORAL_TABLET | Freq: Every day | ORAL | 2 refills | Status: DC

## 2021-11-01 MED ORDER — RYALTRIS 665-25 MCG/ACT NA SUSP: 1.0000 | Freq: Two times a day (BID) | NASAL | 5 refills | Status: DC

## 2021-11-01 NOTE — Telephone Encounter (Signed)
-----   Message from Garnet Sierras, DO sent at 11/01/2021 10:28 AM EDT ----- Please adjust new red vials to build up quicker - red 0.1, 0.3, 0.5. Thank you.

## 2021-11-01 NOTE — Patient Instructions (Addendum)
Perennial and seasonal allergic rhinitis 2019 skin testing was positive to grass, trees, mold, cat, dog, cockroach and dust mites. Continue environmental control measures. Continue allergy injections. Will build up faster with new vials.  May use Singulair '10mg'$  daily as needed.  May use Ryaltris (olopatadine + mometasone nasal spray combination) 1-2 sprays per nostril twice a day as needed.  Nasal saline spray (i.e., Simply Saline) or nasal saline lavage (i.e., NeilMed) is recommended as needed and prior to medicated nasal sprays.   GERD (gastroesophageal reflux disease) Continue appropriate reflux lifestyle modifications. Continue omeprazole '40mg'$  daily in the mornings. No food/drink for 30 minutes afterwards.   Follow up in 12 months or sooner if needed.  Recommend shingles vaccine, RSV and Covid-19 booster.

## 2021-11-01 NOTE — Telephone Encounter (Signed)
Patient's allergy flow sheet has been updated to reflect these changes.  

## 2021-11-01 NOTE — Assessment & Plan Note (Signed)
Past history - 2019 skin testing was positive to grass, trees, mold, cat, dog, cockroach and dust mites. Started AIT on 11/12/2018 (G-T-DM-D & M-C-CR). Interim history - slight flare during ragweed season. Only using Singulair and Ryaltris as needed. Doing well with AIT.    Continue environmental control measures.  Continue allergy injections.  New red vials - build up using 0.1, 0.3, 0.5 schedule.   May use Singulair '10mg'$  daily as needed.   May use Ryaltris (olopatadine + mometasone nasal spray combination) 1-2 sprays per nostril twice a day as needed.   Nasal saline spray (i.e., Simply Saline) or nasal saline lavage (i.e., NeilMed) is recommended as needed and prior to medicated nasal sprays.

## 2021-11-01 NOTE — Assessment & Plan Note (Signed)
Had EGD in June 2023 which showed Barrett's esophagus. Doing better and lump sensation in throat is gone.   Continue appropriate reflux lifestyle modifications.  Continue omeprazole '40mg'$  daily in the mornings.  No food/drink for 30 minutes afterwards.

## 2021-11-17 ENCOUNTER — Ambulatory Visit (INDEPENDENT_AMBULATORY_CARE_PROVIDER_SITE_OTHER): Payer: PPO | Admitting: *Deleted

## 2021-11-17 DIAGNOSIS — J309 Allergic rhinitis, unspecified: Secondary | ICD-10-CM

## 2021-11-23 ENCOUNTER — Ambulatory Visit (INDEPENDENT_AMBULATORY_CARE_PROVIDER_SITE_OTHER): Payer: PPO

## 2021-11-23 DIAGNOSIS — J309 Allergic rhinitis, unspecified: Secondary | ICD-10-CM

## 2021-11-23 DIAGNOSIS — C61 Malignant neoplasm of prostate: Secondary | ICD-10-CM | POA: Diagnosis not present

## 2021-11-30 ENCOUNTER — Ambulatory Visit (INDEPENDENT_AMBULATORY_CARE_PROVIDER_SITE_OTHER): Payer: PPO

## 2021-11-30 DIAGNOSIS — J309 Allergic rhinitis, unspecified: Secondary | ICD-10-CM

## 2021-12-15 ENCOUNTER — Other Ambulatory Visit: Payer: Self-pay | Admitting: Adult Health

## 2021-12-15 DIAGNOSIS — I10 Essential (primary) hypertension: Secondary | ICD-10-CM

## 2021-12-27 ENCOUNTER — Ambulatory Visit (INDEPENDENT_AMBULATORY_CARE_PROVIDER_SITE_OTHER): Payer: PPO

## 2021-12-27 DIAGNOSIS — J309 Allergic rhinitis, unspecified: Secondary | ICD-10-CM

## 2022-01-11 ENCOUNTER — Telehealth: Payer: Self-pay | Admitting: Adult Health

## 2022-01-11 NOTE — Telephone Encounter (Signed)
Ok to advise OTC meds?

## 2022-01-11 NOTE — Telephone Encounter (Signed)
Spoke to pt and he stated he has already tried [employer] and some other stuff. Pt stated it did not work but his diarrhea has calm down some. Pt advise to call us back if sx persist or worsens.

## 2022-01-11 NOTE — Telephone Encounter (Signed)
Patient c/o diarrhea x 2 d. Thinks it comes from allergy meds, requesting something to "stop him up" .declined OV in 1st available with PCP and not open to seeing someone else

## 2022-01-14 ENCOUNTER — Other Ambulatory Visit: Payer: Self-pay

## 2022-01-14 ENCOUNTER — Telehealth: Payer: Self-pay | Admitting: Allergy

## 2022-01-14 ENCOUNTER — Encounter: Payer: Self-pay | Admitting: Internal Medicine

## 2022-01-14 ENCOUNTER — Ambulatory Visit (INDEPENDENT_AMBULATORY_CARE_PROVIDER_SITE_OTHER): Payer: PPO | Admitting: Internal Medicine

## 2022-01-14 VITALS — BP 132/74 | HR 57 | Temp 98.5°F | Resp 16 | Ht 68.5 in | Wt 239.7 lb

## 2022-01-14 DIAGNOSIS — J302 Other seasonal allergic rhinitis: Secondary | ICD-10-CM

## 2022-01-14 DIAGNOSIS — J018 Other acute sinusitis: Secondary | ICD-10-CM | POA: Diagnosis not present

## 2022-01-14 DIAGNOSIS — K219 Gastro-esophageal reflux disease without esophagitis: Secondary | ICD-10-CM | POA: Diagnosis not present

## 2022-01-14 DIAGNOSIS — J0110 Acute frontal sinusitis, unspecified: Secondary | ICD-10-CM | POA: Insufficient documentation

## 2022-01-14 DIAGNOSIS — J3089 Other allergic rhinitis: Secondary | ICD-10-CM | POA: Diagnosis not present

## 2022-01-14 MED ORDER — METHYLPREDNISOLONE ACETATE 40 MG/ML IJ SUSP
40.0000 mg | Freq: Once | INTRAMUSCULAR | Status: AC
  Administered 2022-01-14: 40 mg via INTRAMUSCULAR

## 2022-01-14 MED ORDER — AMOXICILLIN-POT CLAVULANATE 875-125 MG PO TABS: 1.0000 | ORAL_TABLET | Freq: Two times a day (BID) | ORAL | 0 refills | Status: AC

## 2022-01-14 NOTE — Telephone Encounter (Signed)
Marcus Gomez called in and states he is still having chest and head congestion.  He states that he has been taking the medication that was prescribed and he states that he is finding himself thirsty all the time.  [name] wanted to know if something else could be called in for him?  He uses [facility] in [place] on [address].

## 2022-01-14 NOTE — Progress Notes (Unsigned)
Follow Up Note  RE: Marcus Gomez. MRN: [id number] DOB: [birth year] Date of Office Visit: 01/14/2022  Referring provider: [name], NP Primary care provider: [name], NP  Chief Complaint: Cough (Constant cough ) and Allergic Rhinitis  (Head and chest congestion)  History of Present Illness: I had the pleasure of seeing Marcus Gomez for a follow up visit at the [facility] on 01/17/2022. He is a 77 y.o. male, who is being followed for allergic rhinitis, GERD. His previous allergy office visit was on 11/01/21 with Dr. Maudie Mercury. Today is a  acute visit .  History obtained from patient, chart review.  Reports 5 days of nasal congestion, PND, rhinnorhea and chest congestion.  He is having cough throughout the day and night.  Reports "rattle" in his chest.  No wheezing, stridor or dyspnea.     He is on singulair 10 mg and started rylatris 2 sprays daily since Monday without good effect.   He has started nyquil nightly since Monday without good response.    He is on AIT and denies any large locals or systemic reactions.  He does feel like AIT has lessened his overall rhinitis symptoms and recurrence of sinus infections.  Carries EpiPen on  allergy action days.  GERD well-controlled with omeprazole 40 mg daily.  He does forget his medication you have breakthrough symptoms.  Assessment and Plan: [name] is a 77 y.o. male with: Acute non-recurrent sinusitis of other sinus  Seasonal and perennial allergic rhinitis  Gastroesophageal reflux disease without esophagitis Plan: Patient Instructions  Acute sinus infection -40 mg IM Depo-Medrol given today in clinic -Start Augmentin 875/125 mg twice a day for 5 days -Continue treatment for allergic rhinitis as below  Perennial and seasonal allergic rhinitis 2019 skin testing was positive to grass, trees, mold, cat, dog, cockroach and dust mites. Continue environmental control measures. Continue  allergy injections. Ok to get injection next week if you feel better May use Singulair '10mg'$  daily as needed.  May use Ryaltris (olopatadine + mometasone nasal spray combination) 1-2 sprays per nostril twice a day as needed.  Nasal saline spray (i.e., Simply Saline) or nasal saline lavage (i.e., NeilMed) is recommended as needed and prior to medicated nasal sprays.   GERD (gastroesophageal reflux disease) Continue appropriate reflux lifestyle modifications. Continue omeprazole '40mg'$  daily in the mornings. No food/drink for 30 minutes afterwards.   Follow up: With Dr. Maudie Mercury as previously planned  Thank you so much for letting me partake in your care today.  Don't hesitate to reach out if you have any additional concerns!  [name], MD  [facility] [place] [place]  No follow-ups on file.  Meds ordered this encounter  Medications   amoxicillin-clavulanate (AUGMENTIN) 875-125 MG tablet    Sig: Take 1 tablet by mouth 2 (two) times daily for 5 days.    Dispense:  10 tablet    Refill:  0   methylPREDNISolone acetate (DEPO-MEDROL) injection 40 mg    Lab Orders  No laboratory test(s) ordered today   Diagnostics: None done  Medication List:  Current Outpatient Medications  Medication Sig Dispense Refill   AMBULATORY NON FORMULARY MEDICATION Allergy Shots Every 3 weeks     amoxicillin-clavulanate (AUGMENTIN) 875-125 MG tablet Take 1 tablet by mouth 2 (two) times daily for 5 days. 10 tablet 0   Carboxymethylcellulose Sodium (LUBRICATING PLUS EYE DROPS OP) Apply 1 drop to eye 2 (two) times daily.     celecoxib (  CELEBREX) 200 MG capsule Take 200 mg by mouth daily.     EPINEPHrine 0.3 mg/0.3 mL IJ SOAJ injection Inject 0.3 mLs (0.3 mg total) into the muscle once as needed for up to 1 dose for anaphylaxis. 0.3 mL 1   hydrochlorothiazide (HYDRODIURIL) 25 MG tablet Take 1 tablet (25 mg total) by mouth daily. 90 tablet 3   losartan (COZAAR) 100 MG tablet Take 1 tablet by  mouth once daily 90 tablet 0   montelukast (SINGULAIR) 10 MG tablet Take 1 tablet (10 mg total) by mouth at bedtime. 90 tablet 2   Multiple Vitamin (MULTIVITAMIN) tablet Take 1 tablet by mouth daily.     Olopatadine-Mometasone (RYALTRIS) G7528004 MCG/ACT SUSP Place 1-2 sprays into the nose in the morning and at bedtime. 29 g 5   omeprazole (PRILOSEC) 40 MG capsule Take 1 capsule (40 mg total) by mouth daily as needed. 30 capsule 5   rosuvastatin (CRESTOR) 5 MG tablet Take 1 tablet (5 mg total) by mouth every other day. 45 tablet 3   Turmeric (QC TUMERIC COMPLEX PO) Take 1,000 mg by mouth in the morning and at bedtime.     No current facility-administered medications for this visit.   Allergies: Allergies  Allergen Reactions   Lisinopril Cough   I reviewed his past medical history, social history, family history, and environmental history and no significant changes have been reported from his previous visit.  ROS: All others negative except as noted per HPI.   Objective: BP 132/74   Pulse (!) 57   Temp 98.5 F (36.9 C)   Resp 16   Ht 5' 8.5" (1.74 m)   Wt 239 lb 11.2 oz (108.7 kg)   SpO2 98%   BMI 35.92 kg/m  Body mass index is 35.92 kg/m. General Appearance:  Alert, cooperative, no distress, appears stated age  Head:  Normocephalic, without obvious abnormality, atraumatic  Eyes:  Conjunctiva clear, EOM's intact  Nose: Nares normal,  edematous and erythematous nasal mucosa with green rhinorrhea, sinus tenderness over maxillary and frontal sinuses., hypertrophic turbinates, no visible anterior polyps, and septum midline  Throat: Lips, tongue normal; teeth and gums normal, + cobblestoning  Neck: Supple, symmetrical  Lungs:   clear to auscultation bilaterally, Respirations unlabored, intermittent dry coughing  Heart:  regular rate and rhythm and no murmur, Appears well perfused  Extremities: No edema  Skin: Skin color, texture, turgor normal, no rashes or lesions on visualized  portions of skin   Neurologic: No gross deficits   Previous notes and tests were reviewed. The plan was reviewed with the patient/family, and all questions/concerned were addressed.  It was my pleasure to see Marcus Gomez today and participate in his care. Please feel free to contact me with any questions or concerns.  Sincerely,  [name], MD  Allergy & Immunology  [facility] [place] [facility] Office: [phone]

## 2022-01-14 NOTE — Patient Instructions (Signed)
Acute sinus infection -40 mg IM Depo-Medrol given today in clinic -Start Augmentin 875/125 mg twice a day for 5 days -Continue treatment for allergic rhinitis as below  Perennial and seasonal allergic rhinitis 2019 skin testing was positive to grass, trees, mold, cat, dog, cockroach and dust mites. Continue environmental control measures. Continue allergy injections. Ok to get injection next week if you feel better May use Singulair '10mg'$  daily as needed.  May use Ryaltris (olopatadine + mometasone nasal spray combination) 1-2 sprays per nostril twice a day as needed.  Nasal saline spray (i.e., Simply Saline) or nasal saline lavage (i.e., NeilMed) is recommended as needed and prior to medicated nasal sprays.   GERD (gastroesophageal reflux disease) Continue appropriate reflux lifestyle modifications. Continue omeprazole '40mg'$  daily in the mornings. No food/drink for 30 minutes afterwards.   Follow up: With Dr. Maudie Mercury as previously planned  Thank you so much for letting me partake in your care today.  Don't hesitate to reach out if you have any additional concerns!  [name], MD  [facility] [place] [place]

## 2022-01-14 NOTE — Telephone Encounter (Signed)
Please call patient back.  I have not seen patient since October. What medication is he taking for the chest and head congestion??  Can he come in for a visit? Dr. Edison Pace and Dr. Posey Pronto have some openings this morning.

## 2022-01-17 ENCOUNTER — Telehealth: Payer: Self-pay | Admitting: Pharmacist

## 2022-01-17 NOTE — Progress Notes (Signed)
Care Coordination Pharmacy Assistant   Patient ID: Marcus Thornhill., male   DOB: 23-Feb-1945, 77 y.o.   MRN: [id number]  Reason for Encounter: Disease State   Conditions to be addressed/monitored: HTN  Recent office visits:  10/20/21 [name], NP - Patient presented for Routine general medical examination at a health care facility and other concerns. Stopped Meclizine.   Recent consult visits:  01/14/22 Margretta Sidle, MD - Patient presented for Acute non recurrent sinusitis of other sinus and other concerns. Prescribed Augmentin.   12/27/21 Larence Penning, CMA - Patient presented for Allergy injections. No other visit details available.   11/30/21 [name], LPN -  Patient presented for Allergy injections. No other visit details available.   11/23/21  [name], LPN -  Patient presented for Allergy injections. No other visit details available.   11/17/21 Derl Barrow, CMA -  Patient presented for Allergy injections. No other visit details available.   11/01/21 Garnet Sierras, DO - Patient presented for Perennial and seasonal allergic rhinitis and other concerns. Prescribed Montelukast.   10/18/21  [name], LPN -  Patient presented for Allergy injections. No other visit details available.   Hospital visits:  None in previous 6 months  Medications: Outpatient Encounter Medications as of 01/17/2022  Medication Sig Note   AMBULATORY NON FORMULARY MEDICATION Allergy Shots Every 3 weeks    amoxicillin-clavulanate (AUGMENTIN) 875-125 MG tablet Take 1 tablet by mouth 2 (two) times daily for 5 days.    Carboxymethylcellulose Sodium (LUBRICATING PLUS EYE DROPS OP) Apply 1 drop to eye 2 (two) times daily.    celecoxib (CELEBREX) 200 MG capsule Take 200 mg by mouth daily.    EPINEPHrine 0.3 mg/0.3 mL IJ SOAJ injection Inject 0.3 mLs (0.3 mg total) into the muscle once as needed for up to 1 dose for anaphylaxis. 06/25/2021: On hand    hydrochlorothiazide (HYDRODIURIL) 25 MG tablet Take 1 tablet (25 mg total) by mouth daily.    losartan (COZAAR) 100 MG tablet Take 1 tablet by mouth once daily    montelukast (SINGULAIR) 10 MG tablet Take 1 tablet (10 mg total) by mouth at bedtime.    Multiple Vitamin (MULTIVITAMIN) tablet Take 1 tablet by mouth daily.    Olopatadine-Mometasone (RYALTRIS) G7528004 MCG/ACT SUSP Place 1-2 sprays into the nose in the morning and at bedtime.    omeprazole (PRILOSEC) 40 MG capsule Take 1 capsule (40 mg total) by mouth daily as needed.    rosuvastatin (CRESTOR) 5 MG tablet Take 1 tablet (5 mg total) by mouth every other day.    Turmeric (QC TUMERIC COMPLEX PO) Take 1,000 mg by mouth in the morning and at bedtime.    No facility-administered encounter medications on file as of 01/17/2022.   Reviewed chart prior to disease state call. Spoke with patient regarding BP  Recent Office Vitals: BP Readings from Last 3 Encounters:  01/14/22 132/74  11/01/21 126/78  10/20/21 130/68   Pulse Readings from Last 3 Encounters:  01/14/22 (!) 57  11/01/21 78  10/20/21 61    Wt Readings from Last 3 Encounters:  01/14/22 239 lb 11.2 oz (108.7 kg)  11/01/21 230 lb 3.2 oz (104.4 kg)  10/20/21 227 lb (103 kg)     Kidney Function Lab Results  Component Value Date/Time   CREATININE 0.88 10/20/2021 11:32 AM   CREATININE 0.91 06/25/2021 10:07 AM   CREATININE 0.88 07/24/2019 08:23 AM   GFR 83.45 10/20/2021 11:32 AM  GFRNONAA 55 (L) 04/26/2021 01:20 PM   GFRAA 85 (L) 12/09/2012 01:15 PM       Latest Ref Rng & Units 10/20/2021   11:32 AM 06/25/2021   10:07 AM 04/26/2021    1:20 PM  BMP  Glucose 70 - 99 mg/dL 85  99  119   BUN 6 - 23 mg/dL '15  13  18   '$ Creatinine 0.40 - 1.50 mg/dL 0.88  0.91  1.33   Sodium 135 - 145 mEq/L 136  137  130   Potassium 3.5 - 5.1 mEq/L 4.2  4.0  4.0   Chloride 96 - 112 mEq/L 101  102  99   CO2 19 - 32 mEq/L '26  26  23   '$ Calcium 8.4 - 10.5 mg/dL 9.5  9.6  9.3     Current  antihypertensive regimen:  Losartan 100 mg 1 tablet daily - in AM  HCTZ - 25 mg How often are you checking your Blood Pressure? infrequently Current home BP readings:  BP Readings from Last 3 Encounters:  01/14/22 132/74  11/01/21 126/78  10/20/21 130/68  Patient reports his pressures have been good he reports he has not been feeling well so he hadn't been checking lately. He reports he saw his allergist last week and was told he has a sinus infection and was given antibiotics that he does not feel is working. He reports he has 2 more doses left of the medication. He declined follow up with PCP on this matter, he reports he may call back to the allergist to advise if if does not clear by the end of his medication. He declines any fever, nausea, vomiting reports he just has a cough that wont leave.   Adherence Review: Is the patient currently on ACE/ARB medication? Yes Does the patient have >5 day gap between last estimated fill dates? No    Care Gaps: TDAP - Overdue Shingrix - Overdue COVID Booster - Overdue AWV- 01/28/21 BP- 132/74 01/14/22  Star Rating Drugs: Losartan 100 mg - Last filled 12/15/21 90 DS at [facility] Rosuvastatin 5 mg - Last filled 10/20/21 90 DS at [facility] Pharmacist Assistant [phone]

## 2022-01-20 ENCOUNTER — Telehealth: Payer: Self-pay

## 2022-01-20 NOTE — Telephone Encounter (Signed)
Patient called in - DOB/Pharmacy verified - stated since LOV: 01/14/22 he still has the following symptoms since completing the antibiotic: cough - dry; nasal - yellow.  Patient advised message would be forwarded to provider for next step.  Patient verbalized understanding, no further questions.

## 2022-01-21 ENCOUNTER — Other Ambulatory Visit: Payer: Self-pay | Admitting: *Deleted

## 2022-01-21 MED ORDER — RYALTRIS 665-25 MCG/ACT NA SUSP: 1.0000 | Freq: Two times a day (BID) | NASAL | 5 refills | Status: DC

## 2022-01-21 NOTE — Telephone Encounter (Signed)
Called and spoke with the patient and advised. Patient verbalized understanding.

## 2022-01-21 NOTE — Telephone Encounter (Signed)
Cough may linger for a bit unfortunately.  Recommend using his sinus rinses daily, Ryaltris 2 sprays per nostril daily which may help.

## 2022-01-24 ENCOUNTER — Emergency Department (HOSPITAL_BASED_OUTPATIENT_CLINIC_OR_DEPARTMENT_OTHER)
Admission: EM | Admit: 2022-01-24 | Discharge: 2022-01-24 | Disposition: A | Discharge status: Home / Self Care | Payer: PPO | Attending: Emergency Medicine | Admitting: Emergency Medicine

## 2022-01-24 ENCOUNTER — Other Ambulatory Visit: Payer: Self-pay

## 2022-01-24 DIAGNOSIS — Z79899 Other long term (current) drug therapy: Secondary | ICD-10-CM | POA: Insufficient documentation

## 2022-01-24 DIAGNOSIS — I10 Essential (primary) hypertension: Secondary | ICD-10-CM | POA: Insufficient documentation

## 2022-01-24 DIAGNOSIS — R197 Diarrhea, unspecified: Secondary | ICD-10-CM | POA: Diagnosis not present

## 2022-01-24 DIAGNOSIS — Z8546 Personal history of malignant neoplasm of prostate: Secondary | ICD-10-CM | POA: Insufficient documentation

## 2022-01-24 LAB — CBC
HCT: 42.3 % (ref 39.0–52.0)
Hemoglobin: 14 g/dL (ref 13.0–17.0)
MCH: 28.8 pg (ref 26.0–34.0)
MCHC: 33.1 g/dL (ref 30.0–36.0)
MCV: 87 fL (ref 80.0–100.0)
Platelets: 291 10*3/uL (ref 150–400)
RBC: 4.86 MIL/uL (ref 4.22–5.81)
RDW: 13.1 % (ref 11.5–15.5)
WBC: 7.6 10*3/uL (ref 4.0–10.5)
nRBC: 0 % (ref 0.0–0.2)

## 2022-01-24 LAB — COMPREHENSIVE METABOLIC PANEL
ALT: 35 U/L (ref 0–44)
AST: 29 U/L (ref 15–41)
Albumin: 4.2 g/dL (ref 3.5–5.0)
Alkaline Phosphatase: 63 U/L (ref 38–126)
Anion gap: 10 (ref 5–15)
BUN: 16 mg/dL (ref 8–23)
CO2: 20 mmol/L — ABNORMAL LOW (ref 22–32)
Calcium: 8.9 mg/dL (ref 8.9–10.3)
Chloride: 102 mmol/L (ref 98–111)
Creatinine, Ser: 0.93 mg/dL (ref 0.61–1.24)
GFR, Estimated: >60 mL/min (ref >60)
Glucose, Bld: 102 mg/dL — ABNORMAL HIGH (ref 70–99)
Potassium: 3.7 mmol/L (ref 3.5–5.1)
Sodium: 132 mmol/L — ABNORMAL LOW (ref 135–145)
Total Bilirubin: 1.1 mg/dL (ref 0.3–1.2)
Total Protein: 7.6 g/dL (ref 6.5–8.1)

## 2022-01-24 LAB — LIPASE, BLOOD: Lipase: 40 U/L (ref 11–51)

## 2022-01-24 NOTE — ED Notes (Signed)
Reviewed discharge instructions and recommendations with pt and family. States understanding. Ambulatory at discharge

## 2022-01-24 NOTE — ED Notes (Signed)
Patient understands he needs provide a stool sample

## 2022-01-24 NOTE — Discharge Instructions (Signed)
We evaluated you for your diarrhea.  Your lab tests are reassuring without sign of anemia (low red blood cells), or a high white blood cell count.  Your lab test did not show any signs of severe dehydration.  Please take Imodium for your symptoms at home.  You can take 2 to 4 mg every 6 hours as needed for diarrhea and loose stools.  Please be sure to stay hydrated to keep up with fluid loss.  Please also take probiotics since you were recently on antibiotics.  You can get these through yogurt or buy over-the-counter probiotics at the pharmacy.  Please follow-up with your primary care doctor soon as possible.  If your diarrhea persists, they may want to check a stool sample to make sure you do not have a specific type of antibiotic induced diarrhea called C. Diff.   Please return to the emergency department if you develop abdominal pain, bloody diarrhea, fevers, persistent vomiting, or are unable to keep up with fluid intake.

## 2022-01-24 NOTE — ED Triage Notes (Signed)
Patient presents to ED via POV from home. Here with diarrhea since Saturday. Denies abdominal pain. Denies any other symptoms.

## 2022-01-24 NOTE — ED Notes (Signed)
Family was educated on Enteric precautions.

## 2022-01-24 NOTE — ED Notes (Signed)
Pt unable to provide stool specimen while in ED.

## 2022-01-24 NOTE — ED Notes (Signed)
Pt given ginger ale and cracker per EDP

## 2022-01-24 NOTE — ED Provider Triage Note (Signed)
Emergency Medicine Provider Triage Evaluation Note  Jashaun Penrose [name]. , a 77 y.o. male  was evaluated in triage.  Pt complains of diarrhea started 11 days ago x 4-5 days got better with kaopectate and then started again  3 days ago. + tenesmus and cramping. ! Episode of n/v. Recent abcx use and has had c-diff many yrs ago.,.  Review of Systems  Positive: diarrhea Negative: Abd pain   Physical Exam  BP (!) 139/96 (BP Location: Left Arm)   Pulse 72   Temp 98.4 F (36.9 C) (Oral)   Resp 18   Ht '5\' 10"'$  (1.778 m)   Wt 99.8 kg   SpO2 100%   BMI 31.57 kg/m  Gen:   Awake, no distress   Resp:  Normal effort  MSK:   Moves extremities without difficulty  Other:    Medical Decision Making  Medically screening exam initiated at 3:25 PM.  Appropriate orders placed.  [name] was informed that the remainder of the evaluation will be completed by another provider, this initial triage assessment does not replace that evaluation, and the importance of remaining in the ED until their evaluation is complete.     Margarita Mail, PA-C 01/24/22 1530

## 2022-01-24 NOTE — ED Provider Notes (Signed)
[facility] EMERGENCY DEPARTMENT Provider Note  CSN: [id number] Arrival date & time: 01/24/22 1314  Chief Complaint(s) Diarrhea  HPI Marcus Gomez. is a 77 y.o. male presenting to the emergency department with diarrhea.  He reports diarrhea for the past 3 days.  He reports it is watery, has had some mild cramping but only with diarrhea, improved send no abdominal pain generally.  No blood in the stool, reports some black stool but has been using Pepto-Bismol and Kaopectate.  Had 1 episode of nausea and vomiting but otherwise no nausea or vomiting.  He reports that he recently took antibiotics for a sinus infection.  He reports he had diarrhea previously years ago when he took antibiotics, received some kind of prescription but he is not sure if he was diagnosed with C. difficile, does not think he was.  No bright red blood in his stool.  No recent travel, raw meat.  No one else at home sick.   Past Medical History Past Medical History:  Diagnosis Date   Allergies    Allergy    Aortic atherosclerosis (HCC)    Arthritis    Cataract    Diverticulosis    Essential hypertension    GERD (gastroesophageal reflux disease)    Prostate cancer ([place])    Remission   Spondylosis    Patient Active Problem List   Diagnosis Date Noted   Acute non-recurrent frontal sinusitis 01/14/2022   GERD (gastroesophageal reflux disease) 07/24/2017   Perennial and seasonal allergic rhinitis 03/13/2017   Lung field abnormal finding on examination 08/31/2015   Essential hypertension 08/31/2015   Home Medication(s) Prior to Admission medications   Medication Sig Start Date End Date Taking? Authorizing Provider  AMBULATORY NON FORMULARY MEDICATION Allergy Shots Every 3 weeks    [provider]  Carboxymethylcellulose Sodium (LUBRICATING PLUS EYE DROPS OP) Apply 1 drop to eye 2 (two) times daily.    [provider]  celecoxib (CELEBREX) 200 MG capsule Take 200 mg by  mouth daily. 01/08/21   [provider]  EPINEPHrine 0.3 mg/0.3 mL IJ SOAJ injection Inject 0.3 mLs (0.3 mg total) into the muscle once as needed for up to 1 dose for anaphylaxis. 11/12/18   Bobbitt, Sedalia Muta, MD  hydrochlorothiazide (HYDRODIURIL) 25 MG tablet Take 1 tablet (25 mg total) by mouth daily. 10/20/21   Nafziger, [name], NP  losartan (COZAAR) 100 MG tablet Take 1 tablet by mouth once daily 12/15/21   Nafziger, [name], NP  montelukast (SINGULAIR) 10 MG tablet Take 1 tablet (10 mg total) by mouth at bedtime. 11/01/21   Garnet Sierras, DO  Multiple Vitamin (MULTIVITAMIN) tablet Take 1 tablet by mouth daily.    [provider]  Olopatadine-Mometasone Rennie Plowman) [phone] MCG/ACT SUSP Place 1-2 sprays into the nose in the morning and at bedtime. 01/21/22   [name], MD  omeprazole (PRILOSEC) 40 MG capsule Take 1 capsule (40 mg total) by mouth daily as needed. 09/29/21   Garnet Sierras, DO  rosuvastatin (CRESTOR) 5 MG tablet Take 1 tablet (5 mg total) by mouth every other day. 10/20/21   Nafziger, [name], NP  Turmeric (QC TUMERIC COMPLEX PO) Take 1,000 mg by mouth in the morning and at bedtime.    [provider]  Past Surgical History Past Surgical History:  Procedure Laterality Date   KNEE SURGERY Bilateral 2015 & 2014   SHOULDER SURGERY Right 2016   TONSILLECTOMY     Family History Family History  Problem Relation Age of Onset   Stroke Mother    Dementia Mother    Hypotension Mother    Allergies Father    COPD Father        never smoker   Rheum arthritis Father    Glaucoma Father    Glaucoma Paternal Grandmother    Allergic rhinitis Neg Hx    Angioedema Neg Hx    Asthma Neg Hx    Eczema Neg Hx    Immunodeficiency Neg Hx    Urticaria Neg Hx    Colon cancer Neg Hx    Stomach cancer Neg Hx    Esophageal cancer Neg Hx      Social History Social History   Tobacco Use   Smoking status: Never   Smokeless tobacco: Never  Vaping Use   Vaping Use: Never used  Substance Use Topics   Alcohol use: Not Currently    Comment: none in 2 years 06/25/2021   Drug use: No   Allergies Lisinopril  Review of Systems Review of Systems  All other systems reviewed and are negative.   Physical Exam Vital Signs  I have reviewed the triage vital signs BP (!) 151/79 (BP Location: Right Arm)   Pulse 72   Temp 98.5 F (36.9 C) (Oral)   Resp 16   Ht '5\' 10"'$  (1.778 m)   Wt 99.8 kg   SpO2 100%   BMI 31.57 kg/m  Physical Exam Vitals and nursing note reviewed.  Constitutional:      General: He is not in acute distress.    Appearance: Normal appearance.  HENT:     Mouth/Throat:     Mouth: Mucous membranes are moist.  Eyes:     Conjunctiva/sclera: Conjunctivae normal.  Cardiovascular:     Rate and Rhythm: Normal rate and regular rhythm.  Pulmonary:     Effort: Pulmonary effort is normal. No respiratory distress.     Breath sounds: Normal breath sounds.  Abdominal:     General: Abdomen is flat.     Palpations: Abdomen is soft.     Tenderness: There is no abdominal tenderness.  Musculoskeletal:     Right lower leg: No edema.     Left lower leg: No edema.  Skin:    General: Skin is warm and dry.     Capillary Refill: Capillary refill takes less than 2 seconds.  Neurological:     Mental Status: He is alert and oriented to person, place, and time. Mental status is at baseline.  Psychiatric:        Mood and Affect: Mood normal.        Behavior: Behavior normal.     ED Results and Treatments Labs (all labs ordered are listed, but only abnormal results are displayed) Labs Reviewed  COMPREHENSIVE METABOLIC PANEL - Abnormal; Notable for the following components:      Result Value   Sodium 132 (*)    CO2 20 (*)    Glucose, Bld 102 (*)    All other components within normal limits  C DIFFICILE QUICK  SCREEN W PCR REFLEX    GASTROINTESTINAL PANEL BY PCR, STOOL (REPLACES STOOL CULTURE)  LIPASE, BLOOD  CBC  Radiology No results found.  Pertinent labs & imaging results that were available during my care of the patient were reviewed by me and considered in my medical decision making (see MDM for details).  Medications Ordered in ED Medications - No data to display                                                                                                                                   Procedures Procedures  (including critical care time)  Medical Decision Making / ED Course   MDM:  77 year old male presenting to the emergency department with diarrhea.  Patient well-appearing, physical exam without sign of abdominal tenderness.  Patient appears well-hydrated.  Vital signs reassuring.  Lab test reassuring with signs of very mild dehydration with mild acidosis and hyponatremia.  His white blood cell count is normal.  Not anemic.  Suspect likely antibiotic induced diarrhea, lower concern for C. difficile as patient does not have elevated white blood cell count, abdominal pain but is still possible.  Patient unable to provide a stool sample as he has had not had any diarrhea since this morning.  Given benign abdominal exam extremely low concern for acute intra-abdominal process such as diverticulitis, obstruction, volvulus, abscess, perforation.  Will observe for additional period of time after p.o. trial.  If patient unable to provide stool sample discharged with instructions to start Imodium and probiotics, follow-up with his primary care physician and strict return precautions should he have any worsening symptoms such as abdominal pain, bloody stool, fevers.  Lower concern for invasive diarrhea without bloody stool or abdominal pain.  Given reassuring hemoglobin,  doubt GI bleeding with 3 days of symptoms, black stool likely due to Kaopectate/Pepto-Bismol.   Clinical Course as of 01/24/22 1829  Mon Jan 24, 2022  1826 No further episodes of diarrhea observed in the emergency department.  Patient feels well.  Will discharge, instructed to take Imodium and probiotics.  Lower concern for C. difficile.  Discussed return precautions including bloody diarrhea, abdominal pain, fevers.  Advise close follow-up with primary care physician. Will discharge patient to home. All questions answered. Patient comfortable with plan of discharge. Return precautions discussed with patient and specified on the after visit summary.  [WS]    Clinical Course User Index [WS] Cristie Hem, MD     Additional history obtained: -Additional history obtained from ems -External records from outside source obtained and reviewed including: Chart review including previous notes, labs, imaging, consultation notes including office visit 01/14/21   Lab Tests: -I ordered, reviewed, and interpreted labs.   The pertinent results include:   Labs Reviewed  COMPREHENSIVE METABOLIC PANEL - Abnormal; Notable for the following components:      Result Value   Sodium 132 (*)    CO2 20 (*)    Glucose, Bld 102 (*)    All other components within normal limits  C DIFFICILE QUICK SCREEN W PCR  REFLEX    GASTROINTESTINAL PANEL BY PCR, STOOL (REPLACES STOOL CULTURE)  LIPASE, BLOOD  CBC    Notable for very mild hyponatremia and acidosis likely due to diarrhea    Medicines ordered and prescription drug management: No orders of the defined types were placed in this encounter.   -I have reviewed the patients home medicines and have made adjustments as needed  Social Determinants of Health:  Diagnosis or treatment significantly limited by social determinants of health: obesity   Reevaluation: After the interventions noted above, I reevaluated the patient and found that they have  improved  Co morbidities that complicate the patient evaluation  Past Medical History:  Diagnosis Date   Allergies    Allergy    Aortic atherosclerosis ([place])    Arthritis    Cataract    Diverticulosis    Essential hypertension    GERD (gastroesophageal reflux disease)    Prostate cancer ([place])    Remission   Spondylosis       Dispostion: Disposition decision including need for hospitalization was considered, and patient discharged from emergency department.    Final Clinical Impression(s) / ED Diagnoses Final diagnoses:  Diarrhea, unspecified type     This chart was dictated using voice recognition software.  Despite best efforts to proofread,  errors can occur which can change the documentation meaning.    Cristie Hem, MD 01/24/22 [phone]

## 2022-01-24 NOTE — ED Notes (Signed)
Patient recently finished ABX. Has had diarrhea since Saturday. Patient states "like water".

## 2022-02-01 ENCOUNTER — Ambulatory Visit (INDEPENDENT_AMBULATORY_CARE_PROVIDER_SITE_OTHER): Payer: PPO

## 2022-02-01 DIAGNOSIS — J309 Allergic rhinitis, unspecified: Secondary | ICD-10-CM | POA: Diagnosis not present

## 2022-02-11 ENCOUNTER — Ambulatory Visit (INDEPENDENT_AMBULATORY_CARE_PROVIDER_SITE_OTHER): Payer: PPO

## 2022-02-11 VITALS — BP 124/64 | HR 60 | Ht 68.5 in | Wt 223.0 lb

## 2022-02-11 DIAGNOSIS — Z0000 Encounter for general adult medical examination without abnormal findings: Secondary | ICD-10-CM | POA: Diagnosis not present

## 2022-02-11 NOTE — Progress Notes (Signed)
Subjective:   Marcus Gomez. is a 77 y.o. male who presents for Medicare Annual/Subsequent preventive examination.  Review of Systems      Cardiac Risk Factors include: advanced age (>64mn, >>61women);hypertension;male gender;obesity (BMI >30kg/m2)     Objective:    Today's Vitals   02/11/22 0859  BP: 124/64  Pulse: 60  SpO2: 98%  Weight: 223 lb (101.2 kg)  Height: 5' 8.5" (1.74 m)   Body mass index is 33.41 kg/m.     02/11/2022    9:08 AM 01/28/2021    8:38 AM 10/22/2020    6:04 PM 02/13/2020   10:46 AM 09/28/2014    8:20 PM  Advanced Directives  Does Patient Have a Medical Advance Directive? Yes Yes No Yes Yes  Type of AParamedicof [place] will [place] will  [place] will Living will;Healthcare Power of APass ChristianOut of facility DNR (pink MOST or yellow form)  Does patient want to make changes to medical advance directive?  No - Patient declined  No - Patient declined   Copy of [place]in Chart? No - copy requested No - copy requested  No - copy requested No - copy requested    Current Medications (verified) Outpatient Encounter Medications as of 02/11/2022  Medication Sig   AMBULATORY NON FORMULARY MEDICATION Allergy Shots Every 3 weeks   Carboxymethylcellulose Sodium (LUBRICATING PLUS EYE DROPS OP) Apply 1 drop to eye 2 (two) times daily.   celecoxib (CELEBREX) 200 MG capsule Take 200 mg by mouth daily.   EPINEPHrine 0.3 mg/0.3 mL IJ SOAJ injection Inject 0.3 mLs (0.3 mg total) into the muscle once as needed for up to 1 dose for anaphylaxis.   hydrochlorothiazide (HYDRODIURIL) 25 MG tablet Take 1 tablet (25 mg total) by mouth daily.   losartan (COZAAR) 100 MG tablet Take 1 tablet by mouth once daily   montelukast (SINGULAIR) 10 MG tablet Take 1 tablet (10 mg total) by mouth at bedtime.   Multiple Vitamin (MULTIVITAMIN) tablet Take 1 tablet by mouth  daily.   Olopatadine-Mometasone (RYALTRIS) [id number]/ACT SUSP Place 1-2 sprays into the nose in the morning and at bedtime.   omeprazole (PRILOSEC) 40 MG capsule Take 1 capsule (40 mg total) by mouth daily as needed.   rosuvastatin (CRESTOR) 5 MG tablet Take 1 tablet (5 mg total) by mouth every other day.   Turmeric (QC TUMERIC COMPLEX PO) Take 1,000 mg by mouth in the morning and at bedtime.   No facility-administered encounter medications on file as of 02/11/2022.    Allergies (verified) Lisinopril   History: Past Medical History:  Diagnosis Date   Allergies    Allergy    Aortic atherosclerosis (HCC)    Arthritis    Cataract    Diverticulosis    Essential hypertension    GERD (gastroesophageal reflux disease)    Prostate cancer ([place]    Remission   Spondylosis    Past Surgical History:  Procedure Laterality Date   KNEE SURGERY Bilateral 2015 & 2014   SHOULDER SURGERY Right 2016   TONSILLECTOMY     Family History  Problem Relation Age of Onset   Stroke Mother    Dementia Mother    Hypotension Mother    Allergies Father    COPD Father        never smoker   Rheum arthritis Father    Glaucoma Father    Glaucoma Paternal Grandmother  Allergic rhinitis Neg Hx    Angioedema Neg Hx    Asthma Neg Hx    Eczema Neg Hx    Immunodeficiency Neg Hx    Urticaria Neg Hx    Colon cancer Neg Hx    Stomach cancer Neg Hx    Esophageal cancer Neg Hx    Social History   Socioeconomic History   Marital status: Divorced    Spouse name: Not on file   Number of children: 0   Years of education: Not on file   Highest education level: Not on file  Occupational History   Occupation: retired  Tobacco Use   Smoking status: Never   Smokeless tobacco: Never  Vaping Use   Vaping Use: Never used  Substance and Sexual Activity   Alcohol use: Not Currently    Comment: none in 2 years 06/25/2021   Drug use: No   Sexual activity: Yes  Other Topics Concern   Not on file  Social  History Narrative   Retired - Biomedical scientist    Divorced x 4    Two children - 2 girls both live Radiation protection practitioner.       Social Determinants of Health   Financial Resource Strain: Low Risk  (02/11/2022)   Overall Financial Resource Strain (CARDIA)    Difficulty of Paying Living Expenses: Not hard at all  Food Insecurity: No [place] (02/11/2022)   Hunger Vital Sign    Worried About Running Out of Food in the Last Year: Never true    [place] in the Last Year: Never true  Transportation Needs: No Transportation Needs (02/11/2022)   PRAPARE - Hydrologist (Medical): No    Lack of Transportation (Non-Medical): No  Physical Activity: Inactive (02/11/2022)   Exercise Vital Sign    Days of Exercise per Week: 0 days    Minutes of Exercise per Session: 0 min  Stress: No Stress Concern Present (02/11/2022)   [place]    Feeling of Stress : Not at all  Social Connections: Moderately Integrated (02/11/2022)   Social Connection and Isolation Panel [NHANES]    Frequency of Communication with Friends and Family: More than three times a week    Frequency of Social Gatherings with Friends and Family: More than three times a week    Attends Religious Services: More than 4 times per year    Active Member of [employer] or Organizations: Yes    Attends Music therapist: More than 4 times per year    Marital Status: Divorced    Tobacco Counseling Counseling given: Not Answered   Clinical Intake:  Pre-visit preparation completed: Yes  Pain : No/denies pain     BMI - recorded: 33.41 Nutritional Status: BMI > 30  Obese Nutritional Risks: None Diabetes: No  How often do you need to have someone help you when you read instructions, pamphlets, or other written materials from your doctor or pharmacy?: 1 - Never  Diabetic?  No  Interpreter Needed?: No  Information entered by :: Rolene Arbour  LPN   Activities of Daily Living    02/11/2022    9:06 AM 02/07/2022    9:01 AM  In your present state of health, do you have any difficulty performing the following activities:  Hearing? 0 0  Vision? 0 0  Difficulty concentrating or making decisions? 0 0  Walking or climbing stairs? 0 1  Dressing or bathing? 0  0  Doing errands, shopping? 0 0  Preparing Food and eating ? N N  Using the Toilet? N N  In the past six months, have you accidently leaked urine? N Y  Do you have problems with loss of bowel control? N N  Managing your Medications? N N  Managing your Finances? N N  Housekeeping or managing your Housekeeping? N N    Patient Care Team: [name], NP as PCP - General (Family Medicine) [name], [facility] (Inactive) as Pharmacist (Pharmacist) Ortho, [facility] (Orthopedic Surgery)  Indicate any recent Medical Services you may have received from other than [facility] providers in the past year (date may be approximate).     Assessment:   This is a routine wellness examination for [place].  Hearing/Vision screen Hearing Screening - Comments:: Denies hearing difficulties   Vision Screening - Comments:: Wears reading glasses - up to date with routine eye exams with  Dr Rodena Piety  Dietary issues and exercise activities discussed: Current Exercise Habits: The patient does not participate in regular exercise at present, Exercise limited by: None identified   Goals Addressed               This Visit's Progress     No current goals (pt-stated)        Live to my next birthday.       Depression Screen    02/11/2022    9:05 AM 10/20/2021   11:05 AM 01/28/2021    8:29 AM 02/13/2020   10:49 AM 01/25/2018    2:32 PM 10/24/2017    5:10 PM 03/16/2016    8:32 AM  PHQ 2/9 Scores  PHQ - 2 Score 0 0 0 0 0 0 0  PHQ- 9 Score  0         Fall Risk    02/11/2022    9:06 AM 02/07/2022    9:01 AM 10/20/2021   11:06 AM 01/28/2021    8:33 AM 02/13/2020   10:47 AM  Fall Risk   Falls in  the past year? 0 0 0  1  Number falls in past yr: 0  0 0 1  Injury with Fall? 0  0 0 0  Risk for fall due to : No Fall Risks    History of fall(s);Impaired balance/gait  Follow up Falls prevention discussed    Falls evaluation completed;Falls prevention discussed    FALL RISK PREVENTION PERTAINING TO THE HOME:  Any stairs in or around the home? Yes  If so, are there any without handrails? No  Home free of loose throw rugs in walkways, pet beds, electrical cords, etc? Yes  Adequate lighting in your home to reduce risk of falls? Yes   ASSISTIVE DEVICES UTILIZED TO PREVENT FALLS:  Life alert? No  Use of a cane, walker or w/c? No  Grab bars in the bathroom? No  Shower chair or bench in shower? No  Elevated toilet seat or a handicapped toilet? No   TIMED UP AND GO:  Was the test performed? Yes .  Length of time to ambulate 10 feet: 10 sec.   Gait steady and fast without use of assistive device  Cognitive Function:        02/11/2022    9:08 AM 01/28/2021    8:36 AM  6CIT Screen  What Year? 0 points 0 points  What month? 0 points 0 points  What time? 0 points 0 points  Count back from 20 0 points 0 points  Months  in reverse 0 points 0 points  Repeat phrase 0 points 0 points  Total Score 0 points 0 points    Immunizations Immunization History  Administered Date(s) Administered   Fluad Quad(high Dose 65+) 02/13/2020, 10/20/2021   Influenza, High Dose Seasonal PF 11/03/2017   MODERNA COVID-19 SARS-COV-2 PEDS BIVALENT BOOSTER 6Y-11Y 12/21/2019   Moderna SARS-COV2 Booster Vaccination 12/21/2019   Moderna Sars-Covid-2 Vaccination 02/22/2019, 03/22/2019   Pneumococcal Conjugate-13 03/16/2016   Pneumococcal Polysaccharide-23 01/25/2018    TDAP status: Due, Education has been provided regarding the importance of this vaccine. Advised may receive this vaccine at local pharmacy or Health Dept. Aware to provide a copy of the vaccination record if obtained from local pharmacy or  Health Dept. Verbalized acceptance and understanding.  Flu Vaccine status: Up to date  Pneumococcal vaccine status: Up to date  Covid-19 vaccine status: Completed vaccines  Qualifies for Shingles Vaccine? Yes   Zostavax completed No   Shingrix Completed?: No.    Education has been provided regarding the importance of this vaccine. Patient has been advised to call insurance company to determine out of pocket expense if they have not yet received this vaccine. Advised may also receive vaccine at local pharmacy or Health Dept. Verbalized acceptance and understanding.  Screening Tests Health Maintenance  Topic Date Due   DTaP/Tdap/Td (1 - Tdap) Never done   COVID-19 Vaccine (5 - 2023-24 season) 02/27/2022 (Originally 09/10/2021)   Zoster Vaccines- Shingrix (1 of 2) 05/12/2022 (Originally 03/04/1995)   Medicare Annual Wellness (AWV)  02/12/2023   Pneumonia Vaccine 64+ Years old  Completed   INFLUENZA VACCINE  Completed   Hepatitis C Screening  Completed   HPV VACCINES  Aged Out   COLONOSCOPY (Pts 45-30yr Insurance coverage will need to be confirmed)  Discontinued    Health Maintenance  Health Maintenance Due  Topic Date Due   DTaP/Tdap/Td (1 - Tdap) Never done    Colorectal cancer screening: No longer required.   Lung Cancer Screening: (Low Dose CT Chest recommended if Age 77-80years, 30 pack-year currently smoking OR have quit w/in 15years.) does not qualify.     Additional Screening:  Hepatitis C Screening: does qualify; Completed 07/24/20  Vision Screening: Recommended annual ophthalmology exams for early detection of glaucoma and other disorders of the eye. Is the patient up to date with their annual eye exam?  Yes  Who is the provider or what is the name of the office in which the patient attends annual eye exams? Dr MRodena PietyIf pt is not established with a provider, would they like to be referred to a provider to establish care? No .   Dental Screening: Recommended  annual dental exams for proper oral hygiene  Community Resource Referral / Chronic Care Management:  CRR required this visit?  No   CCM required this visit?  No      Plan:     I have personally reviewed and noted the following in the patient's chart:   Medical and social history Use of alcohol, tobacco or illicit drugs  Current medications and supplements including opioid prescriptions. Patient is not currently taking opioid prescriptions. Functional ability and status Nutritional status Physical activity Advanced directives List of other physicians Hospitalizations, surgeries, and ER visits in previous 12 months Vitals Screenings to include cognitive, depression, and falls Referrals and appointments  In addition, I have reviewed and discussed with patient certain preventive protocols, quality metrics, and best practice recommendations. A written personalized care plan for preventive services as well  as general preventive health recommendations were provided to patient.     Criselda Peaches, LPN   08/18/2117   Nurse Notes: None

## 2022-02-11 NOTE — Patient Instructions (Addendum)
[name] , Thank you for taking time to come for your Medicare Wellness Visit. I appreciate your ongoing commitment to your health goals. Please review the following plan we discussed and let me know if I can assist you in the future.   These are the goals we discussed:  Goals       No current goals (pt-stated)      Live to my next birthday.      Patient Stated      I will continue to walk 0.75 mile -1.5 miles per day. Patient will ride bike more.        This is a list of the screening recommended for you and due dates:  Health Maintenance  Topic Date Due   DTaP/Tdap/Td vaccine (1 - Tdap) Never done   COVID-19 Vaccine (5 - 2023-24 season) 02/27/2022*   Zoster (Shingles) Vaccine (1 of 2) 05/12/2022*   Medicare Annual Wellness Visit  02/12/2023   Pneumonia Vaccine  Completed   Flu Shot  Completed   Hepatitis C Screening: USPSTF Recommendation to screen - Ages 18-79 yo.  Completed   HPV Vaccine  Aged Out   Colon Cancer Screening  Discontinued  *Topic was postponed. The date shown is not the original due date.    Advanced directives: Please bring a copy of your health care power of attorney and living will to the office to be added to your chart at your convenience.   Conditions/risks identified: None  Next appointment: Follow up in one year for your annual wellness visit.    Preventive Care 20 Years and Older, Male  Preventive care refers to lifestyle choices and visits with your health care provider that can promote health and wellness. What does preventive care include? A yearly physical exam. This is also called an annual well check. Dental exams once or twice a year. Routine eye exams. Ask your health care provider how often you should have your eyes checked. Personal lifestyle choices, including: Daily care of your teeth and gums. Regular physical activity. Eating a healthy diet. Avoiding tobacco and drug use. Limiting alcohol use. Practicing safe sex. Taking  low doses of aspirin every day. Taking vitamin and mineral supplements as recommended by your health care provider. What happens during an annual well check? The services and screenings done by your health care provider during your annual well check will depend on your age, overall health, lifestyle risk factors, and family history of disease. Counseling  Your health care provider may ask you questions about your: Alcohol use. Tobacco use. Drug use. Emotional well-being. Home and relationship well-being. Sexual activity. Eating habits. History of falls. Memory and ability to understand (cognition). Work and work Statistician. Screening  You may have the following tests or measurements: Height, weight, and BMI. Blood pressure. Lipid and cholesterol levels. These may be checked every 5 years, or more frequently if you are over 6 years old. Skin check. Lung cancer screening. You may have this screening every year starting at age 9 if you have a 30-pack-year history of smoking and currently smoke or have quit within the past 15 years. Fecal occult blood test (FOBT) of the stool. You may have this test every year starting at age 39. Flexible sigmoidoscopy or colonoscopy. You may have a sigmoidoscopy every 5 years or a colonoscopy every 10 years starting at age 18. Prostate cancer screening. Recommendations will vary depending on your family history and other risks. Hepatitis C blood test. Hepatitis B blood test. Sexually transmitted  disease (STD) testing. Diabetes screening. This is done by checking your blood sugar (glucose) after you have not eaten for a while (fasting). You may have this done every 1-3 years. Abdominal aortic aneurysm (AAA) screening. You may need this if you are a current or former smoker. Osteoporosis. You may be screened starting at age 21 if you are at high risk. Talk with your health care provider about your test results, treatment options, and if necessary, the  need for more tests. Vaccines  Your health care provider may recommend certain vaccines, such as: Influenza vaccine. This is recommended every year. Tetanus, diphtheria, and acellular pertussis (Tdap, Td) vaccine. You may need a Td booster every 10 years. Zoster vaccine. You may need this after age 16. Pneumococcal 13-valent conjugate (PCV13) vaccine. One dose is recommended after age 63. Pneumococcal polysaccharide (PPSV23) vaccine. One dose is recommended after age 51. Talk to your health care provider about which screenings and vaccines you need and how often you need them. This information is not intended to replace advice given to you by your health care provider. Make sure you discuss any questions you have with your health care provider. Document Released: 01/23/2015 Document Revised: 09/16/2015 Document Reviewed: 10/28/2014 Elsevier Interactive Patient Education  2017 [place] Prevention in the Home Falls can cause injuries. They can happen to people of all ages. There are many things you can do to make your home safe and to help prevent falls. What can I do on the outside of my home? Regularly fix the edges of walkways and driveways and fix any cracks. Remove anything that might make you trip as you walk through a door, such as a raised step or threshold. Trim any bushes or trees on the path to your home. Use bright outdoor lighting. Clear any walking paths of anything that might make someone trip, such as rocks or tools. Regularly check to see if handrails are loose or broken. Make sure that both sides of any steps have handrails. Any raised decks and porches should have guardrails on the edges. Have any leaves, snow, or ice cleared regularly. Use sand or salt on walking paths during winter. Clean up any spills in your garage right away. This includes oil or grease spills. What can I do in the bathroom? Use night lights. Install grab bars by the toilet and in the tub  and shower. Do not use towel bars as grab bars. Use non-skid mats or decals in the tub or shower. If you need to sit down in the shower, use a plastic, non-slip stool. Keep the floor dry. Clean up any water that spills on the floor as soon as it happens. Remove soap buildup in the tub or shower regularly. Attach bath mats securely with double-sided non-slip rug tape. Do not have throw rugs and other things on the floor that can make you trip. What can I do in the bedroom? Use night lights. Make sure that you have a light by your bed that is easy to reach. Do not use any sheets or blankets that are too big for your bed. They should not hang down onto the floor. Have a firm chair that has side arms. You can use this for support while you get dressed. Do not have throw rugs and other things on the floor that can make you trip. What can I do in the kitchen? Clean up any spills right away. Avoid walking on wet floors. Keep items that you use a  lot in easy-to-reach places. If you need to reach something above you, use a strong step stool that has a grab bar. Keep electrical cords out of the way. Do not use floor polish or wax that makes floors slippery. If you must use wax, use non-skid floor wax. Do not have throw rugs and other things on the floor that can make you trip. What can I do with my stairs? Do not leave any items on the stairs. Make sure that there are handrails on both sides of the stairs and use them. Fix handrails that are broken or loose. Make sure that handrails are as long as the stairways. Check any carpeting to make sure that it is firmly attached to the stairs. Fix any carpet that is loose or worn. Avoid having throw rugs at the top or bottom of the stairs. If you do have throw rugs, attach them to the floor with carpet tape. Make sure that you have a light switch at the top of the stairs and the bottom of the stairs. If you do not have them, ask someone to add them for  you. What else can I do to help prevent falls? Wear shoes that: Do not have high heels. Have rubber bottoms. Are comfortable and fit you well. Are closed at the toe. Do not wear sandals. If you use a stepladder: Make sure that it is fully opened. Do not climb a closed stepladder. Make sure that both sides of the stepladder are locked into place. Ask someone to hold it for you, if possible. Clearly mark and make sure that you can see: Any grab bars or handrails. First and last steps. Where the edge of each step is. Use tools that help you move around (mobility aids) if they are needed. These include: Canes. Walkers. Scooters. Crutches. Turn on the lights when you go into a dark area. Replace any light bulbs as soon as they burn out. Set up your furniture so you have a clear path. Avoid moving your furniture around. If any of your floors are uneven, fix them. If there are any pets around you, be aware of where they are. Review your medicines with your doctor. Some medicines can make you feel dizzy. This can increase your chance of falling. Ask your doctor what other things that you can do to help prevent falls. This information is not intended to replace advice given to you by your health care provider. Make sure you discuss any questions you have with your health care provider. Document Released: 10/23/2008 Document Revised: 06/04/2015 Document Reviewed: 01/31/2014 Elsevier Interactive Patient Education  2017 [employer].

## 2022-03-02 ENCOUNTER — Ambulatory Visit (INDEPENDENT_AMBULATORY_CARE_PROVIDER_SITE_OTHER): Payer: PPO

## 2022-03-02 DIAGNOSIS — J309 Allergic rhinitis, unspecified: Secondary | ICD-10-CM

## 2022-03-12 IMAGING — DX DG CERVICAL SPINE COMPLETE 4+V
5 series · 5 of 5 positions shown · non-contrast
Comparison: March 23, 2016.

CLINICAL DATA: Neck pain.

EXAM:
CERVICAL SPINE - COMPLETE 4+ VIEW

[c-spine lat]
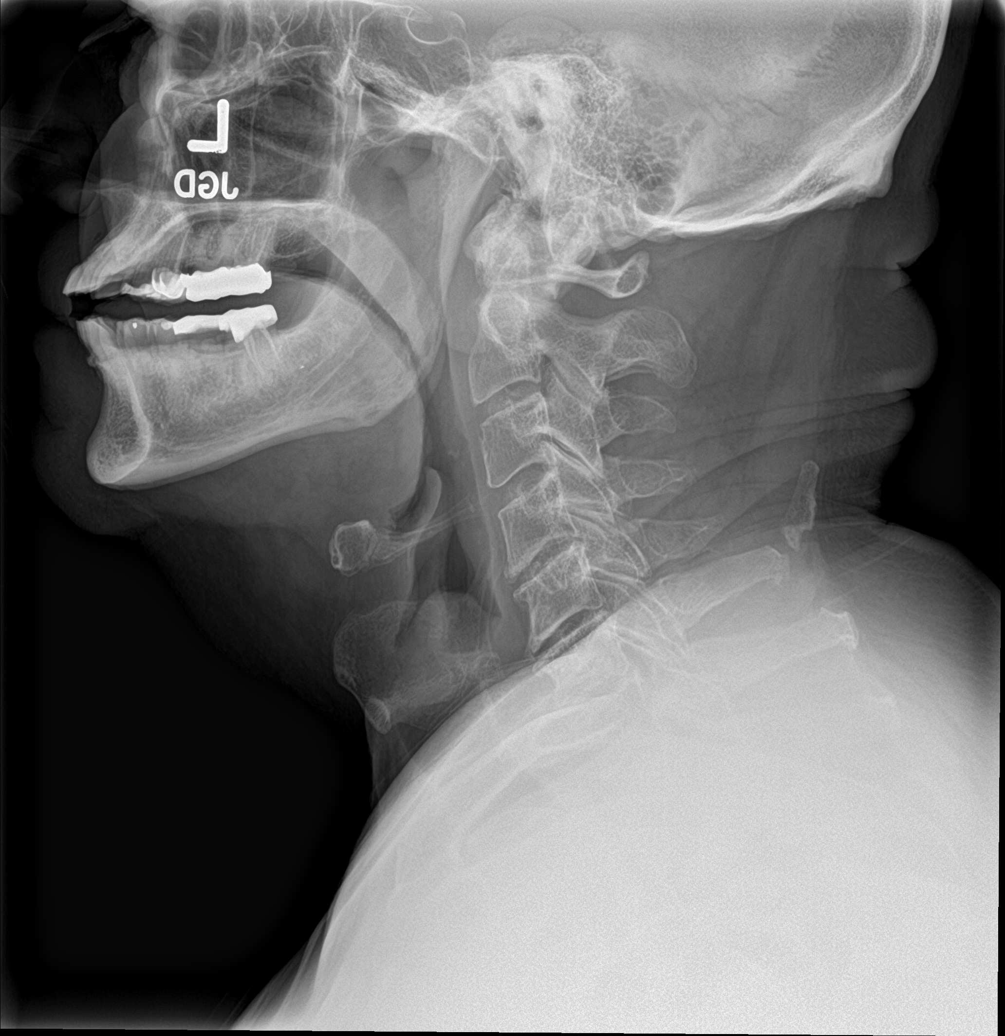

[c-spine obl (1 of 2)]
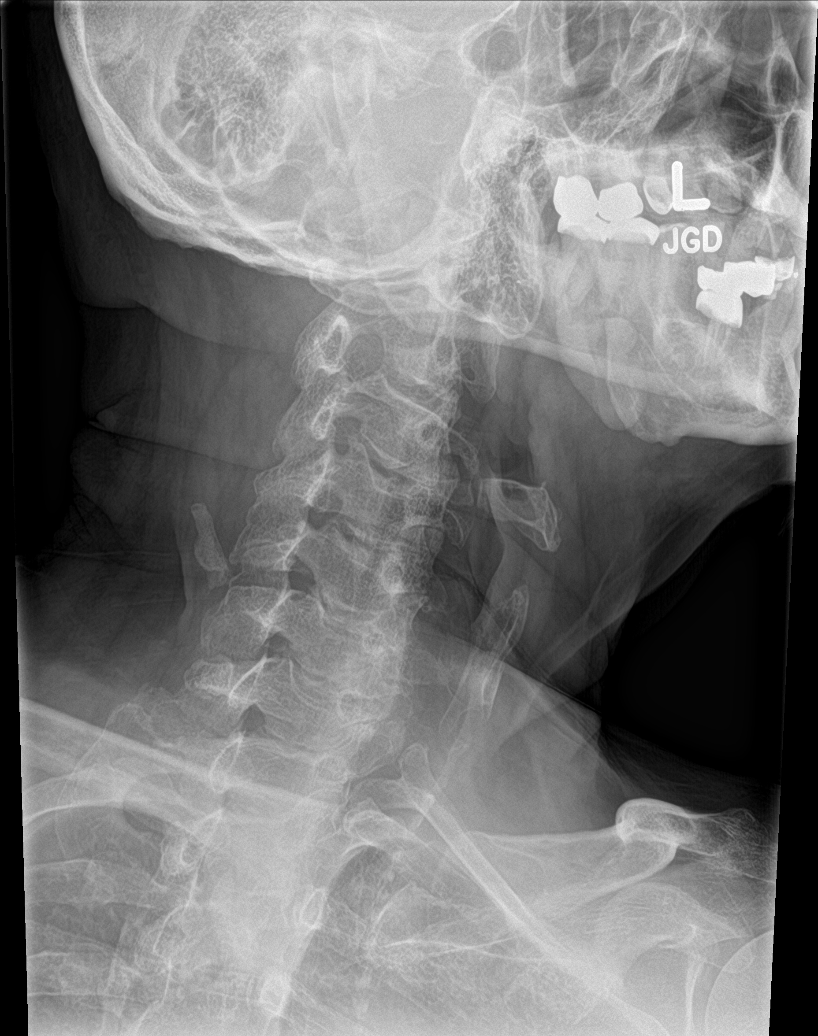

[c-spine obl (2 of 2)]
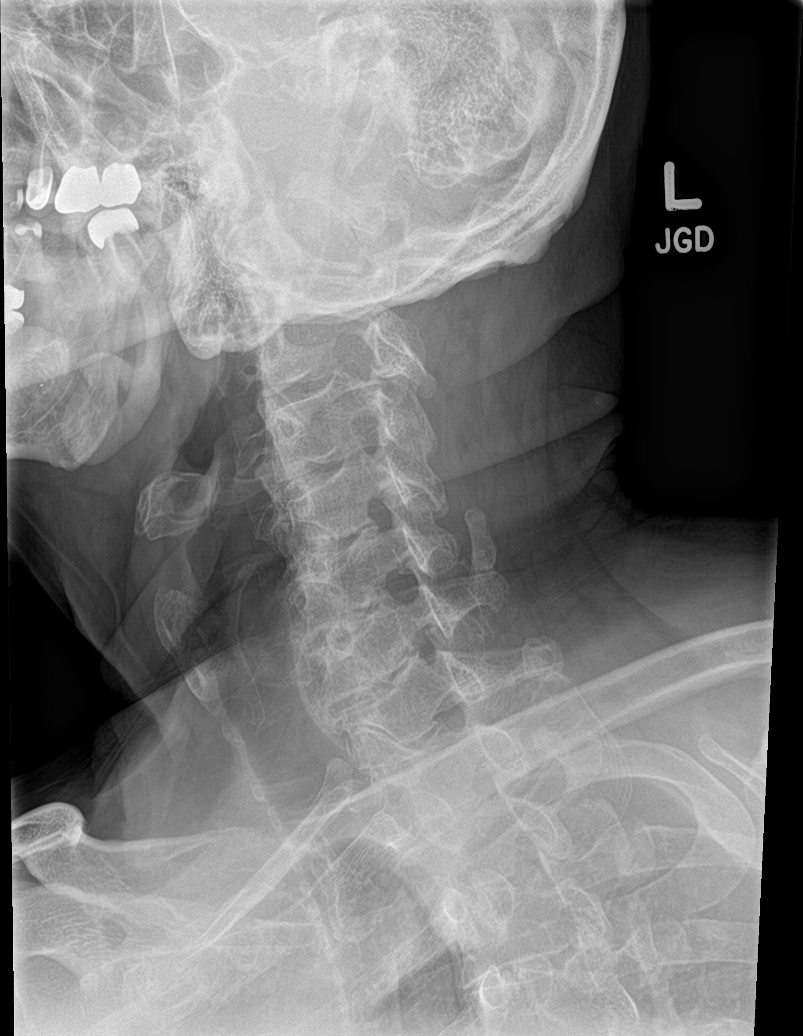

[c-spine ap]
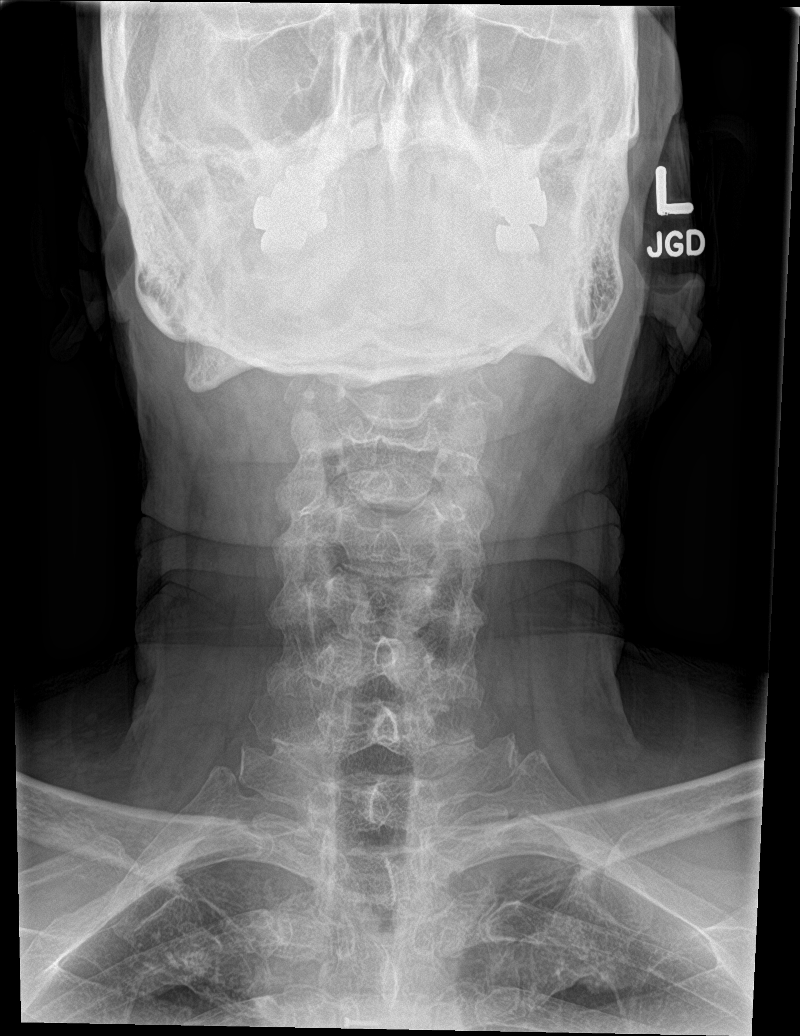

[c-spine swimmers]
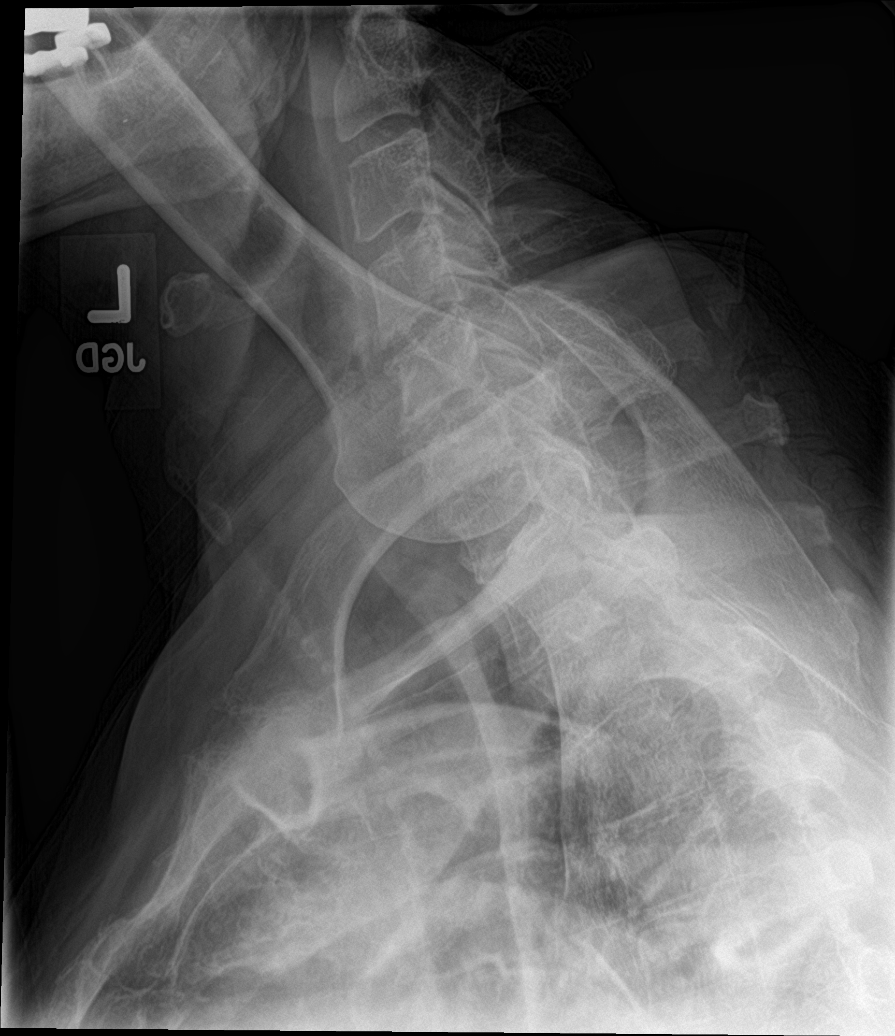

[5 of 5 positions shown; findings below may reference images not displayed]

FINDINGS: There is no evidence of cervical spine fracture or prevertebral soft
tissue swelling. Alignment is normal. Moderate degenerative disc
disease is noted at C4-5, C5-6 and C6-7. No significant neural
foraminal stenosis is noted.
IMPRESSION: Moderate multilevel degenerative disc disease. No acute abnormality
is noted.

## 2022-03-21 ENCOUNTER — Other Ambulatory Visit: Payer: Self-pay | Admitting: Adult Health

## 2022-03-21 DIAGNOSIS — I10 Essential (primary) hypertension: Secondary | ICD-10-CM

## 2022-03-31 ENCOUNTER — Ambulatory Visit (INDEPENDENT_AMBULATORY_CARE_PROVIDER_SITE_OTHER): Payer: PPO

## 2022-03-31 DIAGNOSIS — J309 Allergic rhinitis, unspecified: Secondary | ICD-10-CM

## 2022-04-06 ENCOUNTER — Other Ambulatory Visit: Payer: Self-pay

## 2022-04-06 MED ORDER — OMEPRAZOLE 40 MG PO CPDR: 40.0000 mg | DELAYED_RELEASE_CAPSULE | Freq: Every day | ORAL | 0 refills | Status: DC | PRN

## 2022-04-26 ENCOUNTER — Ambulatory Visit (INDEPENDENT_AMBULATORY_CARE_PROVIDER_SITE_OTHER): Payer: PPO

## 2022-04-26 DIAGNOSIS — J309 Allergic rhinitis, unspecified: Secondary | ICD-10-CM | POA: Diagnosis not present

## 2022-05-19 ENCOUNTER — Telehealth: Payer: Self-pay

## 2022-05-19 NOTE — Progress Notes (Signed)
Patient ID: Marcus Constant., male   DOB: [birth year], 77 y.o.   MRN: [id number]  Care Management & Coordination Services Pharmacy Team  Reason for Encounter: Hypertension  Contacted patient to discuss hypertension disease state. Spoke with family on 05/31/2022     Current antihypertensive regimen:  Losartan 100 mg 1 tablet daily - in pm HCTZ - 25 mg am  Patient verbally confirms he is taking the above medications as directed. Yes  How often are you checking your Blood Pressure? weekly  he checks his blood pressure in the afternoon after taking his medication.  Current home BP readings: Patient reports 151/71 at urology office on yesterday   Any readings above 180/100? No If yes any symptoms of hypertensive emergency? patient denies any symptoms of high blood pressure  What recent interventions/DTPs have been made by any provider to improve Blood Pressure control since last CPP Visit: Patient reports no changes other than he is taking his Losartan at night and the HCTZ in the AM  Any recent hospitalizations or ED visits since last visit with CPP? No   Adherence Review: Is the patient currently on ACE/ARB medication? Yes Does the patient have >5 day gap between last estimated fill dates? No  Star Rating Drugs:  Losartan 100 mg - Last filled 03/22/22 90 DS at [facility] Rosuvastatin 5 mg - Last filled 02/22/22 90 DS at [facility]     Chart Updates: Recent office visits:  None   Recent consult visits:  None  Hospital visits:  Medication Reconciliation was completed by comparing discharge summary, patient's EMR and Pharmacy list, and upon discussion with patient.  Patient presented to Diarrhea on 01/24/22 due to Diarrhea. Patient was present for 1 hour.  New?Medications Started at [facility] Discharge:?? -started  none  Medication Changes at Hospital Discharge: -Changed  none  Medications Discontinued at Hospital Discharge: -Stopped  none  Medications that  remain the same after Hospital Discharge:??  -All other medications will remain the same.     Medications: Outpatient Encounter Medications as of 05/19/2022  Medication Sig Note   AMBULATORY NON FORMULARY MEDICATION Allergy Shots Every 3 weeks    Carboxymethylcellulose Sodium (LUBRICATING PLUS EYE DROPS OP) Apply 1 drop to eye 2 (two) times daily.    celecoxib (CELEBREX) 200 MG capsule Take 200 mg by mouth daily.    EPINEPHrine 0.3 mg/0.3 mL IJ SOAJ injection Inject 0.3 mLs (0.3 mg total) into the muscle once as needed for up to 1 dose for anaphylaxis. 06/25/2021: On hand   hydrochlorothiazide (HYDRODIURIL) 25 MG tablet Take 1 tablet (25 mg total) by mouth daily.    losartan (COZAAR) 100 MG tablet Take 1 tablet by mouth once daily    montelukast (SINGULAIR) 10 MG tablet Take 1 tablet (10 mg total) by mouth at bedtime.    Multiple Vitamin (MULTIVITAMIN) tablet Take 1 tablet by mouth daily.    Olopatadine-Mometasone (RYALTRIS) X543819 MCG/ACT SUSP Place 1-2 sprays into the nose in the morning and at bedtime.    omeprazole (PRILOSEC) 40 MG capsule Take 1 capsule (40 mg total) by mouth daily as needed.    rosuvastatin (CRESTOR) 5 MG tablet Take 1 tablet (5 mg total) by mouth every other day.    Turmeric (QC TUMERIC COMPLEX PO) Take 1,000 mg by mouth in the morning and at bedtime.    No facility-administered encounter medications on file as of 05/19/2022.    Recent Office Vitals: BP Readings from Last 3 Encounters:  02/11/22 124/64  01/24/22 Marland Kitchen)  157/65  01/14/22 132/74   Pulse Readings from Last 3 Encounters:  02/11/22 60  01/24/22 70  01/14/22 (!) 57    Wt Readings from Last 3 Encounters:  02/11/22 223 lb (101.2 kg)  01/24/22 220 lb (99.8 kg)  01/14/22 239 lb 11.2 oz (108.7 kg)     Kidney Function Lab Results  Component Value Date/Time   CREATININE 0.93 01/24/2022 01:24 PM   CREATININE 0.88 10/20/2021 11:32 AM   CREATININE 0.88 07/24/2019 08:23 AM   GFR 83.45 10/20/2021 11:32 AM    GFRNONAA >60 01/24/2022 01:24 PM   GFRAA 85 (L) 12/09/2012 01:15 PM       Latest Ref Rng & Units 01/24/2022    1:24 PM 10/20/2021   11:32 AM 06/25/2021   10:07 AM  BMP  Glucose 70 - 99 mg/dL 161  85  99   BUN 8 - 23 mg/dL 16  15  13    Creatinine 0.61 - 1.24 mg/dL 0.96  0.45  4.09   Sodium 135 - 145 mmol/L 132  136  137   Potassium 3.5 - 5.1 mmol/L 3.7  4.2  4.0   Chloride 98 - 111 mmol/L 102  101  102   CO2 22 - 32 mmol/L 20  26  26    Calcium 8.9 - 10.3 mg/dL 8.9  9.5  9.6         Pamala Duffel CMA Clinical Pharmacist Assistant [phone]

## 2022-05-26 ENCOUNTER — Ambulatory Visit (INDEPENDENT_AMBULATORY_CARE_PROVIDER_SITE_OTHER): Payer: PPO

## 2022-05-26 DIAGNOSIS — J309 Allergic rhinitis, unspecified: Secondary | ICD-10-CM | POA: Diagnosis not present

## 2022-05-30 DIAGNOSIS — C61 Malignant neoplasm of prostate: Secondary | ICD-10-CM | POA: Diagnosis not present

## 2022-05-31 DIAGNOSIS — J3081 Allergic rhinitis due to animal (cat) (dog) hair and dander: Secondary | ICD-10-CM | POA: Diagnosis not present

## 2022-05-31 NOTE — Progress Notes (Signed)
VIALS EXP 05-31-23

## 2022-06-01 DIAGNOSIS — J302 Other seasonal allergic rhinitis: Secondary | ICD-10-CM | POA: Diagnosis not present

## 2022-06-23 ENCOUNTER — Ambulatory Visit (INDEPENDENT_AMBULATORY_CARE_PROVIDER_SITE_OTHER): Payer: PPO

## 2022-06-23 ENCOUNTER — Other Ambulatory Visit: Payer: Self-pay | Admitting: Adult Health

## 2022-06-23 DIAGNOSIS — J309 Allergic rhinitis, unspecified: Secondary | ICD-10-CM | POA: Diagnosis not present

## 2022-06-23 DIAGNOSIS — I10 Essential (primary) hypertension: Secondary | ICD-10-CM

## 2022-07-28 ENCOUNTER — Ambulatory Visit (INDEPENDENT_AMBULATORY_CARE_PROVIDER_SITE_OTHER): Payer: PPO

## 2022-07-28 DIAGNOSIS — J309 Allergic rhinitis, unspecified: Secondary | ICD-10-CM

## 2022-08-04 ENCOUNTER — Ambulatory Visit (INDEPENDENT_AMBULATORY_CARE_PROVIDER_SITE_OTHER): Payer: PPO

## 2022-08-04 DIAGNOSIS — J309 Allergic rhinitis, unspecified: Secondary | ICD-10-CM

## 2022-08-11 ENCOUNTER — Ambulatory Visit (INDEPENDENT_AMBULATORY_CARE_PROVIDER_SITE_OTHER): Payer: PPO

## 2022-08-11 DIAGNOSIS — J309 Allergic rhinitis, unspecified: Secondary | ICD-10-CM

## 2022-08-19 ENCOUNTER — Ambulatory Visit (INDEPENDENT_AMBULATORY_CARE_PROVIDER_SITE_OTHER): Payer: PPO

## 2022-08-19 DIAGNOSIS — J309 Allergic rhinitis, unspecified: Secondary | ICD-10-CM | POA: Diagnosis not present

## 2022-09-14 ENCOUNTER — Ambulatory Visit (INDEPENDENT_AMBULATORY_CARE_PROVIDER_SITE_OTHER): Payer: Self-pay

## 2022-09-14 DIAGNOSIS — J309 Allergic rhinitis, unspecified: Secondary | ICD-10-CM

## 2022-09-23 ENCOUNTER — Other Ambulatory Visit: Payer: Self-pay | Admitting: Adult Health

## 2022-09-23 DIAGNOSIS — I10 Essential (primary) hypertension: Secondary | ICD-10-CM

## 2022-09-23 MED ORDER — LOSARTAN POTASSIUM 100 MG PO TABS: 100.0000 mg | ORAL_TABLET | Freq: Every day | ORAL | 0 refills | Status: DC

## 2022-10-04 ENCOUNTER — Encounter (INDEPENDENT_AMBULATORY_CARE_PROVIDER_SITE_OTHER): Payer: PPO | Admitting: Ophthalmology

## 2022-10-04 DIAGNOSIS — H353132 Nonexudative age-related macular degeneration, bilateral, intermediate dry stage: Secondary | ICD-10-CM

## 2022-10-04 DIAGNOSIS — H43813 Vitreous degeneration, bilateral: Secondary | ICD-10-CM | POA: Diagnosis not present

## 2022-10-04 DIAGNOSIS — H35033 Hypertensive retinopathy, bilateral: Secondary | ICD-10-CM

## 2022-10-04 DIAGNOSIS — I10 Essential (primary) hypertension: Secondary | ICD-10-CM | POA: Diagnosis not present

## 2022-10-04 NOTE — Patient Instructions (Incomplete)
Perennial and seasonal allergic rhinitis 2019 skin testing was positive to grass, trees, mold, cat, dog, cockroach and dust mites. Continue environmental control measures. Continue allergy injections. May use Singulair 10mg  daily as needed.  May use Ryaltris (olopatadine + mometasone nasal spray combination) 1-2 sprays per nostril twice a day as needed.  Nasal saline spray (i.e., Simply Saline) or nasal saline lavage (i.e., NeilMed) is recommended as needed and prior to medicated nasal sprays.   GERD (gastroesophageal reflux disease) Continue appropriate reflux lifestyle modifications. Continue omeprazole 40mg  daily in the mornings. No food/drink for 30 minutes afterwards.   Follow up:6-12 months or sooner if needed

## 2022-10-05 ENCOUNTER — Ambulatory Visit (INDEPENDENT_AMBULATORY_CARE_PROVIDER_SITE_OTHER): Payer: PPO | Admitting: Family

## 2022-10-05 ENCOUNTER — Encounter: Payer: Self-pay | Admitting: Family

## 2022-10-05 ENCOUNTER — Other Ambulatory Visit: Payer: Self-pay

## 2022-10-05 VITALS — BP 128/60 | HR 65 | Temp 98.4°F | Resp 16

## 2022-10-05 DIAGNOSIS — J302 Other seasonal allergic rhinitis: Secondary | ICD-10-CM | POA: Diagnosis not present

## 2022-10-05 DIAGNOSIS — K219 Gastro-esophageal reflux disease without esophagitis: Secondary | ICD-10-CM | POA: Diagnosis not present

## 2022-10-05 DIAGNOSIS — J3089 Other allergic rhinitis: Secondary | ICD-10-CM | POA: Diagnosis not present

## 2022-10-05 MED ORDER — OMEPRAZOLE 40 MG PO CPDR: 40.0000 mg | DELAYED_RELEASE_CAPSULE | Freq: Every day | ORAL | 1 refills | Status: DC | PRN

## 2022-10-05 NOTE — Progress Notes (Signed)
[address] [place] [address] Dept: [phone]  FOLLOW UP NOTE  Patient ID: Marcus Constant., male    DOB: [birth year]  Age: 77 y.o. MRN: [id number] Date of Office Visit: 10/05/2022  Assessment  Chief Complaint: Follow-up  HPI Marcus Gomez. is a 77 year old male who presents today for follow-up of acute nonrecurrent sinusitis, perennial and seasonal allergic rhinitis, and gastroesophageal reflux disease.  He denies any new diagnosis or surgery since his last office visit.  Seasonal and perennial allergic rhinitis: He denies rhinorrhea, nasal congestion, and postnasal drip.  He has not had any sinus infections since the  ast one we treated him for on January 14, 2022.  He continues to receive allergy injections per protocol and feel like they do help.  He denies any problems or reactions with his allergy injections.  He is currently taking Singulair 10 mg as needed and hardly ever uses Ryaltris nasal spray.  He  has not needed saline rinses lately, but reports that they are helpful when he has symptoms.  He feels like his allergies have been pretty good.  He has not had to take an antihistamine in several months.  He reports itchy watery eyes that he attributes to dry eyes.  His eye doctor has him on wetting drops and this helps.  Gastroesophageal reflux disease.  He is currently taking omeprazole 40 mg in the mornings.  His GI doctor has instructed him that he could take 20 mg later on in the day  if needed.  He reports that sometimes he will take the 20 mg at night if needed.  The last time he had to take the omeprazole 20 mg in the afternoon was approximately 3 weeks ago when he had something spicy.  Denies heartburn or reflux symptoms as long as he takes his medication and avoids certain foods.   Drug Allergies:  Allergies  Allergen Reactions   Lisinopril Cough    Review of Systems: Negative except as per HPI   Physical Exam: BP 128/60   Pulse  65   Temp 98.4 F (36.9 C) (Temporal)   Resp 16   SpO2 95%    Physical Exam Constitutional:      Appearance: Normal appearance.  HENT:     Head: Normocephalic and atraumatic.     Comments: Pharynx normal, eyes normal, ears normal, nose normal    Right Ear: Tympanic membrane, ear canal and external ear normal.     Left Ear: Tympanic membrane, ear canal and external ear normal.     Nose: Nose normal.     Mouth/Throat:     Mouth: Mucous membranes are moist.     Pharynx: Oropharynx is clear.  Eyes:     Conjunctiva/sclera: Conjunctivae normal.  Cardiovascular:     Rate and Rhythm: Regular rhythm.     Heart sounds: Normal heart sounds.  Pulmonary:     Effort: Pulmonary effort is normal.     Breath sounds: Normal breath sounds.     Comments: Lungs clear to auscultation Musculoskeletal:     Cervical back: Neck supple.  Skin:    General: Skin is warm.  Neurological:     Mental Status: He is alert and oriented to person, place, and time.  Psychiatric:        Mood and Affect: Mood normal.        Behavior: Behavior normal.        Thought Content: Thought content normal.  Judgment: Judgment normal.     Diagnostics: None  Assessment and Plan: 1. Seasonal and perennial allergic rhinitis   2. Gastroesophageal reflux disease without esophagitis     Meds ordered this encounter  Medications   omeprazole (PRILOSEC) 40 MG capsule    Sig: Take 1 capsule (40 mg total) by mouth daily as needed.    Dispense:  90 capsule    Refill:  1    Patient Instructions  Perennial and seasonal allergic rhinitis 2019 skin testing was positive to grass, trees, mold, cat, dog, cockroach and dust mites. Continue environmental control measures. Continue allergy injections. May use Singulair 10mg  daily as needed.  May use Ryaltris (olopatadine + mometasone nasal spray combination) 1-2 sprays per nostril twice a day as needed.  Nasal saline spray (i.e., Simply Saline) or nasal saline lavage  (i.e., NeilMed) is recommended as needed and prior to medicated nasal sprays.   GERD (gastroesophageal reflux disease) Continue appropriate reflux lifestyle modifications. Continue omeprazole 40mg  daily in the mornings. No food/drink for 30 minutes afterwards.   Follow up:6-12 months or sooner if needed     Return in about 1 year (around 10/05/2023), or if symptoms worsen or fail to improve.    Thank you for the opportunity to care for this patient.  Please do not hesitate to contact me with questions.  Nehemiah Settle, FNP [facility]

## 2022-10-11 ENCOUNTER — Ambulatory Visit (INDEPENDENT_AMBULATORY_CARE_PROVIDER_SITE_OTHER): Payer: PPO | Admitting: *Deleted

## 2022-10-11 DIAGNOSIS — J309 Allergic rhinitis, unspecified: Secondary | ICD-10-CM

## 2022-11-18 ENCOUNTER — Encounter: Payer: Self-pay | Admitting: Gastroenterology

## 2022-11-23 ENCOUNTER — Ambulatory Visit (INDEPENDENT_AMBULATORY_CARE_PROVIDER_SITE_OTHER): Payer: Self-pay | Admitting: *Deleted

## 2022-11-23 DIAGNOSIS — J309 Allergic rhinitis, unspecified: Secondary | ICD-10-CM

## 2022-11-25 ENCOUNTER — Encounter: Payer: Self-pay | Admitting: Gastroenterology

## 2022-12-06 DIAGNOSIS — M25532 Pain in left wrist: Secondary | ICD-10-CM | POA: Diagnosis not present

## 2022-12-16 DIAGNOSIS — M10032 Idiopathic gout, left wrist: Secondary | ICD-10-CM | POA: Diagnosis not present

## 2022-12-20 ENCOUNTER — Telehealth: Payer: Self-pay | Admitting: Adult Health

## 2022-12-20 DIAGNOSIS — I10 Essential (primary) hypertension: Secondary | ICD-10-CM

## 2022-12-20 NOTE — Telephone Encounter (Signed)
Requesting refill of hydrochlorothiazide (HYDRODIURIL) 25 MG tablet to get him thru to his next appt. Last OV 10/2021, has cpe scheduled for 01/03/23.

## 2022-12-21 ENCOUNTER — Ambulatory Visit (INDEPENDENT_AMBULATORY_CARE_PROVIDER_SITE_OTHER): Payer: Self-pay

## 2022-12-21 DIAGNOSIS — J309 Allergic rhinitis, unspecified: Secondary | ICD-10-CM

## 2022-12-21 MED ORDER — HYDROCHLOROTHIAZIDE 25 MG PO TABS: 25.0000 mg | ORAL_TABLET | Freq: Every day | ORAL | 0 refills | Status: DC

## 2022-12-21 NOTE — Addendum Note (Signed)
Addended by: Waymon Amato R on: 12/21/2022 04:47 PM   Modules accepted: Orders

## 2022-12-21 NOTE — Telephone Encounter (Signed)
Prescription sent to pharmacy. No other action needed.  

## 2022-12-22 ENCOUNTER — Ambulatory Visit (AMBULATORY_SURGERY_CENTER): Payer: PPO

## 2022-12-22 ENCOUNTER — Telehealth: Payer: Self-pay | Admitting: Adult Health

## 2022-12-22 ENCOUNTER — Other Ambulatory Visit: Payer: Self-pay

## 2022-12-22 VITALS — Ht 70.0 in | Wt 240.0 lb

## 2022-12-22 DIAGNOSIS — I10 Essential (primary) hypertension: Secondary | ICD-10-CM

## 2022-12-22 DIAGNOSIS — Z1211 Encounter for screening for malignant neoplasm of colon: Secondary | ICD-10-CM

## 2022-12-22 MED ORDER — NA SULFATE-K SULFATE-MG SULF 17.5-3.13-1.6 GM/177ML PO SOLN: 1.0000 | Freq: Once | ORAL | 0 refills | Status: AC

## 2022-12-22 MED ORDER — LOSARTAN POTASSIUM 100 MG PO TABS: 100.0000 mg | ORAL_TABLET | Freq: Every day | ORAL | 0 refills | Status: DC

## 2022-12-22 NOTE — Telephone Encounter (Signed)
Prescription Request  12/22/2022  LOV: Visit date not found  What is the name of the medication or equipment?  losartan losartan (COZAAR) 100 MG tablet  Have you contacted your pharmacy to request a refill? Yes   Pharmacy called to request this refill, on behalf of the patient.   Which pharmacy would you like this sent to?   [facility] [address], [place] - [address] [facility] [place] [address] Phone: [phone] Fax: [phone]    Patient notified that their request is being sent to the clinical staff for review and that they should receive a response within 2 business days.   Please advise at Mobile [phone] (mobile)

## 2022-12-22 NOTE — Progress Notes (Signed)

## 2022-12-22 NOTE — Telephone Encounter (Signed)
Refill sent.

## 2023-01-03 ENCOUNTER — Ambulatory Visit: Payer: PPO | Admitting: Adult Health

## 2023-01-03 ENCOUNTER — Encounter: Payer: Self-pay | Admitting: Adult Health

## 2023-01-03 VITALS — BP 110/62 | HR 61 | Temp 98.2°F | Ht 70.0 in | Wt 241.0 lb

## 2023-01-03 DIAGNOSIS — K219 Gastro-esophageal reflux disease without esophagitis: Secondary | ICD-10-CM | POA: Diagnosis not present

## 2023-01-03 DIAGNOSIS — E782 Mixed hyperlipidemia: Secondary | ICD-10-CM

## 2023-01-03 DIAGNOSIS — I10 Essential (primary) hypertension: Secondary | ICD-10-CM

## 2023-01-03 DIAGNOSIS — J3089 Other allergic rhinitis: Secondary | ICD-10-CM | POA: Diagnosis not present

## 2023-01-03 DIAGNOSIS — Z23 Encounter for immunization: Secondary | ICD-10-CM | POA: Diagnosis not present

## 2023-01-03 DIAGNOSIS — Z0000 Encounter for general adult medical examination without abnormal findings: Secondary | ICD-10-CM | POA: Diagnosis not present

## 2023-01-03 DIAGNOSIS — Z8546 Personal history of malignant neoplasm of prostate: Secondary | ICD-10-CM | POA: Diagnosis not present

## 2023-01-03 LAB — LIPID PANEL
Cholesterol: 145 mg/dL (ref 0–200)
HDL: 49.5 mg/dL (ref >39.00)
LDL Cholesterol: 81 mg/dL (ref 0–99)
NonHDL: 95.32
Total CHOL/HDL Ratio: 3
Triglycerides: 74 mg/dL (ref 0.0–149.0)
VLDL: 14.8 mg/dL (ref 0.0–40.0)

## 2023-01-03 LAB — COMPREHENSIVE METABOLIC PANEL
ALT: 24 U/L (ref 0–53)
AST: 18 U/L (ref 0–37)
Albumin: 4.2 g/dL (ref 3.5–5.2)
Alkaline Phosphatase: 76 U/L (ref 39–117)
BUN: 15 mg/dL (ref 6–23)
CO2: 28 meq/L (ref 19–32)
Calcium: 9.4 mg/dL (ref 8.4–10.5)
Chloride: 101 meq/L (ref 96–112)
Creatinine, Ser: 0.9 mg/dL (ref 0.40–1.50)
GFR: 82.19 mL/min (ref >60.00)
Glucose, Bld: 103 mg/dL — ABNORMAL HIGH (ref 70–99)
Potassium: 4.4 meq/L (ref 3.5–5.1)
Sodium: 137 meq/L (ref 135–145)
Total Bilirubin: 1.1 mg/dL (ref 0.2–1.2)
Total Protein: 6.5 g/dL (ref 6.0–8.3)

## 2023-01-03 LAB — CBC
HCT: 39.4 % (ref 39.0–52.0)
Hemoglobin: 13.1 g/dL (ref 13.0–17.0)
MCHC: 33.1 g/dL (ref 30.0–36.0)
MCV: 88.2 fL (ref 78.0–100.0)
Platelets: 194 10*3/uL (ref 150.0–400.0)
RBC: 4.47 Mil/uL (ref 4.22–5.81)
RDW: 14 % (ref 11.5–15.5)
WBC: 5.7 10*3/uL (ref 4.0–10.5)

## 2023-01-03 LAB — TSH: TSH: 2.39 u[IU]/mL (ref 0.35–5.50)

## 2023-01-03 LAB — PSA: PSA: 0.01 ng/mL — ABNORMAL LOW (ref 0.10–4.00)

## 2023-01-03 MED ORDER — HYDROCHLOROTHIAZIDE 25 MG PO TABS: 25.0000 mg | ORAL_TABLET | Freq: Every day | ORAL | 3 refills | Status: DC

## 2023-01-03 MED ORDER — ROSUVASTATIN CALCIUM 5 MG PO TABS: 5.0000 mg | ORAL_TABLET | ORAL | 3 refills | Status: DC

## 2023-01-03 NOTE — Patient Instructions (Signed)
It was great seeing you today   We will follow up with you regarding your lab work   Please let me know if you need anything   

## 2023-01-03 NOTE — Progress Notes (Signed)
Subjective:    Patient ID: Marcus Constant., male    DOB: 28-May-1945, 77 y.o.   MRN: [id number]  HPI Patient presents for yearly preventative medicine examination. He is a pleasant 77 year old male who  has a past medical history of Allergies, Allergy, Aortic atherosclerosis (HCC), Arthritis, Cataract, Diverticulosis, Essential hypertension, GERD (gastroesophageal reflux disease), Prostate cancer (HCC), and Spondylosis.  Hypertension-managed with Cozaar 100 mg daily and HCTZ 25 mg daily. He does not monitor his blood pressures at home on a routine basis.  Denies dizziness, lightheadedness, chest pain, or shortness of breath BP Readings from Last 3 Encounters:  01/03/23 110/62  10/05/22 128/60  02/11/22 124/64   Hyperlipidemia-managed with Crestor 5 mg every other day.  He denies myalgia or fatigue Lab Results  Component Value Date   CHOL 141 10/20/2021   HDL 45.00 10/20/2021   LDLCALC 76 10/20/2021   TRIG 97.0 10/20/2021   CHOLHDL 3 10/20/2021   History of prostate cancer-has been followed by urology every 6 months. Has been cancer free for 21 years.   Seasonal Allergies - managed by allergy and asthma.  Receives immunotherapy injections and takes singular nightly.  GERD-takes Prilosec 20 mg daily  All immunizations and health maintenance protocols were reviewed with the patient and needed orders were placed.  Appropriate screening laboratory values were ordered for the patient including screening of hyperlipidemia, renal function and hepatic function. If indicated by BPH, a PSA was ordered.  Medication reconciliation,  past medical history, social history, problem list and allergies were reviewed in detail with the patient  Goals were established with regard to weight loss, exercise, and  diet in compliance with medications  Wt Readings from Last 3 Encounters:  01/03/23 241 lb (109.3 kg)  12/22/22 240 lb (108.9 kg)  02/11/22 223 lb (101.2 kg)   Review of  Systems  Constitutional: Negative.   HENT: Negative.    Eyes: Negative.   Respiratory: Negative.    Cardiovascular: Negative.   Gastrointestinal: Negative.   Endocrine: Negative.   Genitourinary: Negative.   Musculoskeletal:  Positive for arthralgias, back pain and neck pain.  Skin: Negative.   Allergic/Immunologic: Negative.   Neurological: Negative.   Hematological: Negative.   Psychiatric/Behavioral: Negative.    All other systems reviewed and are negative.  Past Medical History:  Diagnosis Date   Allergies    Allergy    Aortic atherosclerosis (HCC)    Arthritis    Cataract    Diverticulosis    Essential hypertension    GERD (gastroesophageal reflux disease)    Prostate cancer (HCC)    Remission   Spondylosis     Social History   Socioeconomic History   Marital status: Divorced    Spouse name: Not on file   Number of children: 0   Years of education: Not on file   Highest education level: Not on file  Occupational History   Occupation: retired  Tobacco Use   Smoking status: Never   Smokeless tobacco: Never  Vaping Use   Vaping status: Never Used  Substance and Sexual Activity   Alcohol use: Not Currently    Comment: none in 2 years 06/25/2021   Drug use: No   Sexual activity: Yes  Other Topics Concern   Not on file  Social History Narrative   Retired - Aeronautical engineer    Divorced x 4    Two children - 2 girls both live Designer, multimedia.       Social Drivers of Health  Financial Resource Strain: Low Risk  (02/11/2022)   Overall Financial Resource Strain (CARDIA)    Difficulty of Paying Living Expenses: Not hard at all  Food Insecurity: No Food Insecurity (02/11/2022)   Hunger Vital Sign    Worried About Running Out of Food in the Last Year: Never true    Ran Out of Food in the Last Year: Never true  Transportation Needs: No Transportation Needs (02/11/2022)   PRAPARE - Administrator, Civil Service (Medical): No    Lack of Transportation (Non-Medical):  No  Physical Activity: Inactive (02/11/2022)   Exercise Vital Sign    Days of Exercise per Week: 0 days    Minutes of Exercise per Session: 0 min  Stress: No Stress Concern Present (02/11/2022)   [employer] of Occupational Health - Occupational Stress Questionnaire    Feeling of Stress : Not at all  Social Connections: Moderately Integrated (02/11/2022)   Social Connection and Isolation Panel [NHANES]    Frequency of Communication with Friends and Family: More than three times a week    Frequency of Social Gatherings with Friends and Family: More than three times a week    Attends Religious Services: More than 4 times per year    Active Member of [employer] or Organizations: Yes    Attends Engineer, structural: More than 4 times per year    Marital Status: Divorced  Intimate Partner Violence: Not At Risk (02/11/2022)   Humiliation, Afraid, Rape, and Kick questionnaire    Fear of Current or Ex-Partner: No    Emotionally Abused: No    Physically Abused: No    Sexually Abused: No    Past Surgical History:  Procedure Laterality Date   KNEE SURGERY Bilateral 2015 & 2014   SHOULDER SURGERY Right 2016   TONSILLECTOMY      Family History  Problem Relation Age of Onset   Stroke Mother    Dementia Mother    Hypotension Mother    Allergies Father    COPD Father        never smoker   Rheum arthritis Father    Glaucoma Father    Glaucoma Paternal Grandmother    Allergic rhinitis Neg Hx    Angioedema Neg Hx    Asthma Neg Hx    Eczema Neg Hx    Immunodeficiency Neg Hx    Urticaria Neg Hx    Colon cancer Neg Hx    Stomach cancer Neg Hx    Esophageal cancer Neg Hx    Colon polyps Neg Hx    Rectal cancer Neg Hx     Allergies  Allergen Reactions   Lisinopril Cough    Current Outpatient Medications on File Prior to Visit  Medication Sig Dispense Refill   AMBULATORY NON FORMULARY MEDICATION Allergy Shots Every 3 weeks     Carboxymethylcellulose Sodium (LUBRICATING PLUS  EYE DROPS OP) Apply 1 drop to eye 2 (two) times daily.     celecoxib (CELEBREX) 200 MG capsule Take 200 mg by mouth daily.     EPINEPHrine 0.3 mg/0.3 mL IJ SOAJ injection Inject 0.3 mLs (0.3 mg total) into the muscle once as needed for up to 1 dose for anaphylaxis. 0.3 mL 1   losartan (COZAAR) 100 MG tablet Take 1 tablet (100 mg total) by mouth daily. 90 tablet 0   montelukast (SINGULAIR) 10 MG tablet Take 1 tablet (10 mg total) by mouth at bedtime. 90 tablet 2   Multiple Vitamin (MULTIVITAMIN) tablet Take  1 tablet by mouth daily.     Multiple Vitamins-Minerals (PRESERVISION AREDS) CAPS Take by mouth.     Olopatadine-Mometasone (RYALTRIS) X543819 MCG/ACT SUSP Place 1-2 sprays into the nose in the morning and at bedtime. 29 g 5   omeprazole (PRILOSEC) 40 MG capsule Take 1 capsule (40 mg total) by mouth daily as needed. 90 capsule 1   Propylene Glycol (SYSTANE BALANCE) 0.6 % SOLN Apply to eye.     Turmeric (QC TUMERIC COMPLEX PO) Take 1,000 mg by mouth in the morning and at bedtime.     Zinc 50 MG TABS Take by mouth.     No current facility-administered medications on file prior to visit.    BP 110/62   Pulse 61   Temp 98.2 F (36.8 C) (Oral)   Ht 5\' 10"  (1.778 m)   Wt 241 lb (109.3 kg)   SpO2 98%   BMI 34.58 kg/m       Objective:   Physical Exam Vitals and nursing note reviewed.  Constitutional:      General: He is not in acute distress.    Appearance: Normal appearance. He is not ill-appearing.  HENT:     Head: Normocephalic and atraumatic.     Right Ear: Tympanic membrane, ear canal and external ear normal. There is no impacted cerumen.     Left Ear: Tympanic membrane, ear canal and external ear normal. There is no impacted cerumen.     Nose: Nose normal. No congestion or rhinorrhea.     Mouth/Throat:     Mouth: Mucous membranes are moist.     Pharynx: Oropharynx is clear.  Eyes:     Extraocular Movements: Extraocular movements intact.     Conjunctiva/sclera: Conjunctivae  normal.     Pupils: Pupils are equal, round, and reactive to light.  Neck:     Vascular: No carotid bruit.  Cardiovascular:     Rate and Rhythm: Normal rate and regular rhythm.     Pulses: Normal pulses.     Heart sounds: No murmur heard.    No friction rub. No gallop.  Pulmonary:     Effort: Pulmonary effort is normal.     Breath sounds: Normal breath sounds.  Abdominal:     General: Abdomen is flat. Bowel sounds are normal. There is no distension.     Palpations: Abdomen is soft. There is no mass.     Tenderness: There is no abdominal tenderness. There is no guarding or rebound.     Hernia: No hernia is present.  Musculoskeletal:        General: Normal range of motion.     Cervical back: Normal range of motion and neck supple.  Lymphadenopathy:     Cervical: No cervical adenopathy.  Skin:    General: Skin is warm and dry.     Capillary Refill: Capillary refill takes less than 2 seconds.  Neurological:     General: No focal deficit present.     Mental Status: He is alert and oriented to person, place, and time.  Psychiatric:        Mood and Affect: Mood normal.        Behavior: Behavior normal.        Thought Content: Thought content normal.        Judgment: Judgment normal.        Assessment & Plan:  1. Routine general medical examination at a health care facility (Primary) Today patient counseled on age appropriate routine health concerns for  screening and prevention, each reviewed and up to date or declined. Immunizations reviewed and up to date or declined. Labs ordered and reviewed. Risk factors for depression reviewed and negative. Hearing function and visual acuity are intact. ADLs screened and addressed as needed. Functional ability and level of safety reviewed and appropriate. Education, counseling and referrals performed based on assessed risks today. Patient provided with a copy of personalized plan for preventive services. - Flu shot given today  - Encouraged  weight loss through diet and exercise  2. Essential hypertension - Well controlled. No change in medication  - Lipid panel; Future - TSH; Future - CBC; Future - Comprehensive metabolic panel; Future - hydrochlorothiazide (HYDRODIURIL) 25 MG tablet; Take 1 tablet (25 mg total) by mouth daily.  Dispense: 90 tablet; Refill: 3  3. Mixed hyperlipidemia - Continue with statin  - Lipid panel; Future - TSH; Future - CBC; Future - Comprehensive metabolic panel; Future - rosuvastatin (CRESTOR) 5 MG tablet; Take 1 tablet (5 mg total) by mouth every other day.  Dispense: 45 tablet; Refill: 3  4. History of prostate cancer - Follow up with Urology as directed  - PSA; Future  5. Gastroesophageal reflux disease, unspecified whether esophagitis present - GERD  - Lipid panel; Future - TSH; Future - CBC; Future - Comprehensive metabolic panel; Future  6. Perennial and seasonal allergic rhinitis - Per Allergy and Asthma  Shirline Frees, NP

## 2023-01-03 NOTE — Addendum Note (Signed)
Addended by: Waymon Amato R on: 01/03/2023 07:22 AM   Modules accepted: Orders

## 2023-01-17 ENCOUNTER — Encounter: Payer: Self-pay | Admitting: Gastroenterology

## 2023-01-18 ENCOUNTER — Ambulatory Visit: Payer: PPO | Admitting: Gastroenterology

## 2023-01-18 ENCOUNTER — Encounter: Payer: Self-pay | Admitting: Gastroenterology

## 2023-01-18 VITALS — BP 156/81 | HR 80 | Temp 97.8°F | Resp 12 | Ht 70.0 in | Wt 240.0 lb

## 2023-01-18 DIAGNOSIS — K449 Diaphragmatic hernia without obstruction or gangrene: Secondary | ICD-10-CM | POA: Diagnosis not present

## 2023-01-18 DIAGNOSIS — Z1211 Encounter for screening for malignant neoplasm of colon: Secondary | ICD-10-CM

## 2023-01-18 DIAGNOSIS — K22719 Barrett's esophagus with dysplasia, unspecified: Secondary | ICD-10-CM | POA: Diagnosis not present

## 2023-01-18 DIAGNOSIS — K635 Polyp of colon: Secondary | ICD-10-CM

## 2023-01-18 DIAGNOSIS — D125 Benign neoplasm of sigmoid colon: Secondary | ICD-10-CM | POA: Diagnosis not present

## 2023-01-18 DIAGNOSIS — I10 Essential (primary) hypertension: Secondary | ICD-10-CM | POA: Diagnosis not present

## 2023-01-18 DIAGNOSIS — K2270 Barrett's esophagus without dysplasia: Secondary | ICD-10-CM

## 2023-01-18 DIAGNOSIS — K648 Other hemorrhoids: Secondary | ICD-10-CM

## 2023-01-18 DIAGNOSIS — K5730 Diverticulosis of large intestine without perforation or abscess without bleeding: Secondary | ICD-10-CM

## 2023-01-18 MED ORDER — SODIUM CHLORIDE 0.9 % IV SOLN: 500.0000 mL | INTRAVENOUS | Status: AC

## 2023-01-18 NOTE — Progress Notes (Signed)
 Called to room to assist during endoscopic procedure.  Patient ID and intended procedure confirmed with present staff. Received instructions for my participation in the procedure from the performing physician.

## 2023-01-18 NOTE — Patient Instructions (Addendum)
 Return to normal activities tomorrow. - Resume previous diet. - Continue present medications. - Await pathology results. - Repeat upper endoscopy ( date not yet determined) for surveillance based on pathology results.  Given patient' s age and lack of high risk polyps, I would recommend against further colon cancer screening - Recommend daily fiber supplement/ high fiber diet to reduce risk of diverticular complications.  Please see handouts provided by discharge nurse: Hiatal Hernia, Hemorrhoids, and Diverticulosis  YOU HAD AN ENDOSCOPIC PROCEDURE TODAY AT THE [facility]:   Refer to the procedure report that was given to you for any specific questions about what was found during the examination.  If the procedure report does not answer your questions, please call your gastroenterologist to clarify.  If you requested that your care partner not be given the details of your procedure findings, then the procedure report has been included in a sealed envelope for you to review at your convenience later.  YOU SHOULD EXPECT: Some feelings of bloating in the abdomen. Passage of more gas than usual.  Walking can help get rid of the air that was put into your GI tract during the procedure and reduce the bloating. If you had a lower endoscopy (such as a colonoscopy or flexible sigmoidoscopy) you may notice spotting of blood in your stool or on the toilet paper. If you underwent a bowel prep for your procedure, you may not have a normal bowel movement for a few days.  Please Note:  You might notice some irritation and congestion in your nose or some drainage.  This is from the oxygen used during your procedure.  There is no need for concern and it should clear up in a day or so.  SYMPTOMS TO REPORT IMMEDIATELY:  Following lower endoscopy (colonoscopy or flexible sigmoidoscopy):  Excessive amounts of blood in the stool  Significant tenderness or worsening of abdominal pains  Swelling of the  abdomen that is new, acute  Fever of 100F or higher  Following upper endoscopy (EGD)  Vomiting of blood or coffee ground material  New chest pain or pain under the shoulder blades  Painful or persistently difficult swallowing  New shortness of breath  Fever of 100F or higher  Black, tarry-looking stools  For urgent or emergent issues, a gastroenterologist can be reached at any hour by calling [phone]. Do not use MyChart messaging for urgent concerns.    DIET:  We do recommend a small meal at first, but then you may proceed to your regular diet.  Drink plenty of fluids but you should avoid alcoholic beverages for 24 hours.  ACTIVITY:  You should plan to take it easy for the rest of today and you should NOT DRIVE or use heavy machinery until tomorrow (because of the sedation medicines used during the test).    FOLLOW UP: Our staff will call the number listed on your records the next business day following your procedure.  We will call around 7:15- 8:00 am to check on you and address any questions or concerns that you may have regarding the information given to you following your procedure. If we do not reach you, we will leave a message.     If any biopsies were taken you will be contacted by phone or by letter within the next 1-3 weeks.  Please call us  at [phone] if you have not heard about the biopsies in 3 weeks.    SIGNATURES/CONFIDENTIALITY: You and/or your care partner have signed paperwork  which will be entered into your electronic medical record.  These signatures attest to the fact that that the information above on your After Visit Summary has been reviewed and is understood.  Full responsibility of the confidentiality of this discharge information lies with you and/or your care-partner.

## 2023-01-18 NOTE — Progress Notes (Signed)
 [facility] History and Physical   Primary Care Physician:  Merna Huxley, NP   Reason for Procedure:   Barrett's surveillance, colon cancer screening  Plan:    EGD, colonoscopy     HPI: Marcus Gomez. is a 78 y.o. male undergoing average risk screening colonoscopy.  He has no family history of colon cancer and no chronic lower GI symptoms.   He had a colonoscopy in 2014 in which a nonprecancerous polyp was removed. He underwent an EGD in June 2023 and was found to have nondyplastic Barrett's.   Past Medical History:  Diagnosis Date   Allergies    Allergy    Aortic atherosclerosis (HCC)    Arthritis    Cataract    Diverticulosis    Essential hypertension    GERD (gastroesophageal reflux disease)    Prostate cancer (HCC)    Remission   Spondylosis     Past Surgical History:  Procedure Laterality Date   KNEE SURGERY Bilateral 2015 & 2014   SHOULDER SURGERY Right 2016   TONSILLECTOMY      Prior to Admission medications   Medication Sig Start Date End Date Taking? Authorizing Provider  Carboxymethylcellulose Sodium (LUBRICATING PLUS EYE DROPS OP) Apply 1 drop to eye 2 (two) times daily.   Yes [provider]  hydrochlorothiazide  (HYDRODIURIL ) 25 MG tablet Take 1 tablet (25 mg total) by mouth daily. 01/03/23  Yes Nafziger, Huxley, NP  losartan  (COZAAR ) 100 MG tablet Take 1 tablet (100 mg total) by mouth daily. 12/22/22  Yes Johnny Garnette LABOR, MD  montelukast  (SINGULAIR ) 10 MG tablet Take 1 tablet (10 mg total) by mouth at bedtime. 11/01/21  Yes Luke Orlan HERO, DO  Multiple Vitamin (MULTIVITAMIN) tablet Take 1 tablet by mouth daily.   Yes [provider]  Multiple Vitamins-Minerals (PRESERVISION AREDS) CAPS Take by mouth. 10/14/19  Yes [provider]  omeprazole  (PRILOSEC) 40 MG capsule Take 1 capsule (40 mg total) by mouth daily as needed. 10/05/22  Yes Cheryl Reusing, FNP  Propylene Glycol (SYSTANE BALANCE) 0.6 % SOLN Apply to  eye. 10/14/19  Yes [provider]  rosuvastatin  (CRESTOR ) 5 MG tablet Take 1 tablet (5 mg total) by mouth every other day. 01/03/23  Yes Nafziger, Huxley, NP  Turmeric (QC TUMERIC COMPLEX PO) Take 1,000 mg by mouth in the morning and at bedtime.   Yes [provider]  Zinc 50 MG TABS Take by mouth.   Yes [provider]  AMBULATORY NON FORMULARY MEDICATION Allergy Shots Every 3 weeks    [provider]  celecoxib (CELEBREX) 200 MG capsule Take 200 mg by mouth daily. 01/08/21   [provider]  EPINEPHrine  0.3 mg/0.3 mL IJ SOAJ injection Inject 0.3 mLs (0.3 mg total) into the muscle once as needed for up to 1 dose for anaphylaxis. 11/12/18   Bobbitt, Elgin Pepper, MD  Olopatadine -Mometasone (RYALTRIS ) 665-25 MCG/ACT SUSP Place 1-2 sprays into the nose in the morning and at bedtime. 01/21/22   Lorin Norris, MD    Current Outpatient Medications  Medication Sig Dispense Refill   Carboxymethylcellulose Sodium (LUBRICATING PLUS EYE DROPS OP) Apply 1 drop to eye 2 (two) times daily.     hydrochlorothiazide  (HYDRODIURIL ) 25 MG tablet Take 1 tablet (25 mg total) by mouth daily. 90 tablet 3   losartan  (COZAAR ) 100 MG tablet Take 1 tablet (100 mg total) by mouth daily. 90 tablet 0   montelukast  (SINGULAIR ) 10 MG tablet Take 1 tablet (10 mg total) by mouth  at bedtime. 90 tablet 2   Multiple Vitamin (MULTIVITAMIN) tablet Take 1 tablet by mouth daily.     Multiple Vitamins-Minerals (PRESERVISION AREDS) CAPS Take by mouth.     omeprazole  (PRILOSEC) 40 MG capsule Take 1 capsule (40 mg total) by mouth daily as needed. 90 capsule 1   Propylene Glycol (SYSTANE BALANCE) 0.6 % SOLN Apply to eye.     rosuvastatin  (CRESTOR ) 5 MG tablet Take 1 tablet (5 mg total) by mouth every other day. 45 tablet 3   Turmeric (QC TUMERIC COMPLEX PO) Take 1,000 mg by mouth in the morning and at bedtime.     Zinc 50 MG TABS Take by mouth.     AMBULATORY NON FORMULARY MEDICATION Allergy  Shots Every 3 weeks     celecoxib (CELEBREX) 200 MG capsule Take 200 mg by mouth daily.     EPINEPHrine  0.3 mg/0.3 mL IJ SOAJ injection Inject 0.3 mLs (0.3 mg total) into the muscle once as needed for up to 1 dose for anaphylaxis. 0.3 mL 1   Olopatadine -Mometasone (RYALTRIS ) 665-25 MCG/ACT SUSP Place 1-2 sprays into the nose in the morning and at bedtime. 29 g 5   Current Facility-Administered Medications  Medication Dose Route Frequency Provider Last Rate Last Admin   0.9 %  sodium chloride  infusion  500 mL Intravenous Continuous Stacia Glendia BRAVO, MD        Allergies as of 01/18/2023 - Review Complete 01/18/2023  Allergen Reaction Noted   Lisinopril  Cough 08/11/2016    Family History  Problem Relation Age of Onset   Stroke Mother    Dementia Mother    Hypotension Mother    Allergies Father    COPD Father        never smoker   Rheum arthritis Father    Glaucoma Father    Glaucoma Paternal Grandmother    Allergic rhinitis Neg Hx    Angioedema Neg Hx    Asthma Neg Hx    Eczema Neg Hx    Immunodeficiency Neg Hx    Urticaria Neg Hx    Colon cancer Neg Hx    Stomach cancer Neg Hx    Esophageal cancer Neg Hx    Colon polyps Neg Hx    Rectal cancer Neg Hx     Social History   Socioeconomic History   Marital status: Divorced    Spouse name: Not on file   Number of children: 0   Years of education: Not on file   Highest education level: Not on file  Occupational History   Occupation: retired  Tobacco Use   Smoking status: Never   Smokeless tobacco: Never  Vaping Use   Vaping status: Never Used  Substance and Sexual Activity   Alcohol use: Not Currently    Comment: none in 2 years 06/25/2021   Drug use: No   Sexual activity: Yes  Other Topics Concern   Not on file  Social History Narrative   Retired - Aeronautical Engineer    Divorced x 4    Two children - 2 girls both live designer, multimedia.       Social Drivers of Corporate Investment Banker Strain: Low Risk  (02/11/2022)    Overall Financial Resource Strain (CARDIA)    Difficulty of Paying Living Expenses: Not hard at all  Food Insecurity: No Food Insecurity (02/11/2022)   Hunger Vital Sign    Worried About Running Out of Food in the Last Year: Never true    Ran Out of Food  in the Last Year: Never true  Transportation Needs: No Transportation Needs (02/11/2022)   PRAPARE - Administrator, Civil Service (Medical): No    Lack of Transportation (Non-Medical): No  Physical Activity: Inactive (02/11/2022)   Exercise Vital Sign    Days of Exercise per Week: 0 days    Minutes of Exercise per Session: 0 min  Stress: No Stress Concern Present (02/11/2022)   [employer] of Occupational Health - Occupational Stress Questionnaire    Feeling of Stress : Not at all  Social Connections: Moderately Integrated (02/11/2022)   Social Connection and Isolation Panel [NHANES]    Frequency of Communication with Friends and Family: More than three times a week    Frequency of Social Gatherings with Friends and Family: More than three times a week    Attends Religious Services: More than 4 times per year    Active Member of [employer] or Organizations: Yes    Attends Banker Meetings: More than 4 times per year    Marital Status: Divorced  Intimate Partner Violence: Not At Risk (02/11/2022)   Humiliation, Afraid, Rape, and Kick questionnaire    Fear of Current or Ex-Partner: No    Emotionally Abused: No    Physically Abused: No    Sexually Abused: No    Review of Systems:  All other review of systems negative except as mentioned in the HPI.  Physical Exam: Vital signs BP (!) 155/74   Pulse 75   Temp 97.8 F (36.6 C) (Temporal)   Ht 5' 10 (1.778 m)   Wt 240 lb (108.9 kg)   SpO2 99%   BMI 34.44 kg/m   General:   Alert,  Well-developed, well-nourished, pleasant and cooperative in NAD Airway:  Mallampati 3 Lungs:  Clear throughout to auscultation.   Heart:  Regular rate and rhythm; no murmurs,  clicks, rubs,  or gallops. Abdomen:  Soft, nontender and nondistended. Normal bowel sounds.   Neuro/Psych:  Normal mood and affect. A and O x 3   Zakyla Tonche E. Stacia, MD [facility]

## 2023-01-18 NOTE — Progress Notes (Signed)
 Pt's states no medical or surgical changes since previsit or office visit.

## 2023-01-18 NOTE — Progress Notes (Signed)
 Vss nad trans to pacu

## 2023-01-18 NOTE — Op Note (Signed)
 [facility] Patient Name: Marcus Gomez Procedure Date: 01/18/2023 8:27 AM MRN: [id number] Endoscopist: Glendia E. Stacia , MD, [id number] Age: 78 Referring MD:  Date of Birth: 1945-03-05 Gender: Male Account #: [id number] Procedure:                Colonoscopy Indications:              Screening for colorectal malignant neoplasm (last                            colonoscopy was 10 years ago) Medicines:                Monitored Anesthesia Care Procedure:                Pre-Anesthesia Assessment:                           - Prior to the procedure, a History and Physical                            was performed, and patient medications and                            allergies were reviewed. The patient's tolerance of                            previous anesthesia was also reviewed. The risks                            and benefits of the procedure and the sedation                            options and risks were discussed with the patient.                            All questions were answered, and informed consent                            was obtained. Prior Anticoagulants: The patient has                            taken no anticoagulant or antiplatelet agents. ASA                            Grade Assessment: II - A patient with mild systemic                            disease. After reviewing the risks and benefits,                            the patient was deemed in satisfactory condition to                            undergo the procedure.  After obtaining informed consent, the colonoscope                            was passed under direct vision. Throughout the                            procedure, the patient's blood pressure, pulse, and                            oxygen saturations were monitored continuously. The                            Olympus Scope [id number] was introduced through the                            anus and advanced  to the the terminal ileum, with                            identification of the appendiceal orifice and IC                            valve. The colonoscopy was performed without                            difficulty. The patient tolerated the procedure                            well. The quality of the bowel preparation was                            good. The terminal ileum, ileocecal valve,                            appendiceal orifice, and rectum were photographed.                            The bowel preparation used was SUPREP via split                            dose instruction. Scope In: 8:59:28 AM Scope Out: 9:15:44 AM Scope Withdrawal Time: 0 hours 13 minutes 16 seconds  Total Procedure Duration: 0 hours 16 minutes 16 seconds  Findings:                 The perianal and digital rectal examinations were                            normal. Pertinent negatives include normal                            sphincter tone and no palpable rectal lesions.                           A 6 mm polyp was found in the sigmoid colon. The  polyp was flat. The polyp was removed with a cold                            snare. Resection and retrieval were complete.                            Estimated blood loss was minimal.                           Multiple large-mouthed and medium-mouthed                            diverticula were found in the sigmoid colon,                            descending colon and transverse colon.                           The exam was otherwise normal throughout the                            examined colon.                           The terminal ileum appeared normal.                           Non-bleeding internal hemorrhoids were found during                            retroflexion. The hemorrhoids were medium-sized.                           No additional abnormalities were found on                            retroflexion. Complications:             No immediate complications. Estimated Blood Loss:     Estimated blood loss was minimal. Impression:               - One 6 mm polyp in the sigmoid colon, removed with                            a cold snare. Resected and retrieved.                           - Moderate diverticulosis in the sigmoid colon, in                            the descending colon and in the transverse colon.                           - The examined portion of the ileum was normal.                           -  Non-bleeding internal hemorrhoids. Recommendation:           - Patient has a contact number available for                            emergencies. The signs and symptoms of potential                            delayed complications were discussed with the                            patient. Return to normal activities tomorrow.                            Written discharge instructions were provided to the                            patient.                           - Resume previous diet.                           - Continue present medications.                           - Await pathology results.                           - Given patient's age and lack of high risk polyps,                            I would recommend against further colon cancer                            screening                           - Recommend daily fiber supplement/high fiber diet                            to reduce risk of diverticular complications. Janaki Exley E. Stacia, MD 01/18/2023 9:30:01 AM This report has been signed electronically.

## 2023-01-18 NOTE — Op Note (Signed)
 [facility] Patient Name: Marcus Gomez Procedure Date: 01/18/2023 8:32 AM MRN: [id number] Endoscopist: Glendia E. Stacia , MD, [id number] Age: 78 Referring MD:  Date of Birth: 18-Jan-1945 Gender: Male Account #: [id number] Procedure:                Upper GI endoscopy Indications:              Follow-up of Barrett's esophagus Medicines:                Monitored Anesthesia Care Procedure:                Pre-Anesthesia Assessment:                           - Prior to the procedure, a History and Physical                            was performed, and patient medications and                            allergies were reviewed. The patient's tolerance of                            previous anesthesia was also reviewed. The risks                            and benefits of the procedure and the sedation                            options and risks were discussed with the patient.                            All questions were answered, and informed consent                            was obtained. Prior Anticoagulants: The patient has                            taken no anticoagulant or antiplatelet agents. ASA                            Grade Assessment: II - A patient with mild systemic                            disease. After reviewing the risks and benefits,                            the patient was deemed in satisfactory condition to                            undergo the procedure.                           After obtaining informed consent, the endoscope was  passed under direct vision. Throughout the                            procedure, the patient's blood pressure, pulse, and                            oxygen saturations were monitored continuously. The                            Olympus scope [id number] was introduced through the                            mouth, and advanced to the second part of duodenum.                            The upper GI  endoscopy was accomplished without                            difficulty. The patient tolerated the procedure                            well. Scope In: Scope Out: Findings:                 The examined portions of the nasopharynx,                            oropharynx and larynx were normal.                           There were esophageal mucosal changes classified as                            Barrett's stage C2-M4 per Prague criteria present                            in the lower third of the esophagus. The maximum                            longitudinal extent of these mucosal changes was 4                            cm in length. Mucosa was biopsied with a cold                            forceps for histology in 4 quadrants at intervals                            of 1 cm from 34 to 38 cm from the incisors. A total                            of 4 specimen bottles were sent to pathology.  Estimated blood loss was minimal. No nodularity or                            other suspicious findings were seen.                           The exam of the esophagus was otherwise normal.                           A 5 cm hiatal hernia was present.                           The exam of the stomach was otherwise normal.                           The examined duodenum was normal. Complications:            No immediate complications. Estimated Blood Loss:     Estimated blood loss was minimal. Impression:               - The examined portions of the nasopharynx,                            oropharynx and larynx were normal.                           - Esophageal mucosal changes classified as                            Barrett's stage C2-M4 per Prague criteria. Biopsied.                           - 5 cm hiatal hernia.                           - Normal examined duodenum. Recommendation:           - Patient has a contact number available for                             emergencies. The signs and symptoms of potential                            delayed complications were discussed with the                            patient. Return to normal activities tomorrow.                            Written discharge instructions were provided to the                            patient.                           - Resume previous diet.                           -  Continue present medications.                           - Await pathology results.                           - Repeat upper endoscopy (date not yet determined)                            for surveillance based on pathology results. Shallen Luedke E. Stacia, MD 01/18/2023 9:22:51 AM This report has been signed electronically.

## 2023-01-19 ENCOUNTER — Telehealth: Payer: Self-pay

## 2023-01-19 NOTE — Telephone Encounter (Signed)
  Follow up Call-     01/18/2023    7:33 AM 06/29/2021    7:37 AM  Call back number  Post procedure Call Back phone  # [phone]  Permission to leave phone message Yes Yes     Patient questions:  Do you have a fever, pain , or abdominal swelling? No. Pain Score  0 *  Have you tolerated food without any problems? Yes.    Have you been able to return to your normal activities? Yes.    Do you have any questions about your discharge instructions: Diet   No. Medications  No. Follow up visit  No.  Do you have questions or concerns about your Care? No.  Actions: * If pain score is 4 or above: No action needed, pain <4.

## 2023-01-23 ENCOUNTER — Ambulatory Visit (INDEPENDENT_AMBULATORY_CARE_PROVIDER_SITE_OTHER): Payer: PPO

## 2023-01-23 DIAGNOSIS — J309 Allergic rhinitis, unspecified: Secondary | ICD-10-CM

## 2023-01-23 LAB — SURGICAL PATHOLOGY

## 2023-01-24 DIAGNOSIS — J3089 Other allergic rhinitis: Secondary | ICD-10-CM | POA: Diagnosis not present

## 2023-01-24 NOTE — Progress Notes (Signed)
 VIAL EXP 01-24-23

## 2023-01-25 DIAGNOSIS — J3081 Allergic rhinitis due to animal (cat) (dog) hair and dander: Secondary | ICD-10-CM | POA: Diagnosis not present

## 2023-01-25 NOTE — Progress Notes (Signed)
 Marcus Gomez, The biopsies that I took during your recent procedure showed Barrett's mucosa (intestinal metaplasia), but NO sign of the pre-cancerous change called dysplasia.   Therefore, unless new symptoms arise you will not need another upper endoscopy for 3 years.  You should continue to take a proton pump inhibitor such as omeprazole  daily. Given your age, I would recommend you see me in the office in 3 years to discuss the risks and benefits of having another upper endoscopy for Barrett's surveillance.  The polyp that was removed from your colon was not precancerous.  As discussed, given your age and lack of high risk polyps, I would recommend against any further colon cancer screening.

## 2023-01-30 ENCOUNTER — Other Ambulatory Visit: Payer: Self-pay

## 2023-01-30 MED ORDER — MONTELUKAST SODIUM 10 MG PO TABS: ORAL_TABLET | ORAL | 2 refills | Status: AC

## 2023-02-20 ENCOUNTER — Ambulatory Visit (INDEPENDENT_AMBULATORY_CARE_PROVIDER_SITE_OTHER): Payer: PPO

## 2023-02-20 ENCOUNTER — Ambulatory Visit (INDEPENDENT_AMBULATORY_CARE_PROVIDER_SITE_OTHER): Payer: Self-pay | Admitting: *Deleted

## 2023-02-20 VITALS — BP 120/60 | HR 60 | Temp 97.9°F | Ht 70.0 in | Wt 240.1 lb

## 2023-02-20 DIAGNOSIS — Z0000 Encounter for general adult medical examination without abnormal findings: Secondary | ICD-10-CM

## 2023-02-20 DIAGNOSIS — J309 Allergic rhinitis, unspecified: Secondary | ICD-10-CM | POA: Diagnosis not present

## 2023-02-20 NOTE — Progress Notes (Signed)
 Subjective:   Marcus Handrich. is a 78 y.o. male who presents for Medicare Annual/Subsequent preventive examination.  Visit Complete: In person      Objective:    Today's Vitals   02/20/23 1413  BP: 120/60  Pulse: 60  Temp: 97.9 F (36.6 C)  TempSrc: Oral  SpO2: 98%  Weight: 240 lb 1.6 oz (108.9 kg)  Height: 5\' 10"  (1.778 m)   Body mass index is 34.45 kg/m.     02/20/2023    2:31 PM 02/11/2022    9:08 AM 01/28/2021    8:38 AM 10/22/2020    6:04 PM 02/13/2020   10:46 AM 09/28/2014    8:20 PM  Advanced Directives  Does Patient Have a Medical Advance Directive? Yes Yes Yes No Yes Yes  Type of Estate agent of [place];Living will Healthcare Power of [place];Living will Healthcare Power of [place];Living will  Healthcare Power of [place];Living will Living will;Healthcare Power of [place];Out of facility DNR (pink MOST or yellow form)  Does patient want to make changes to medical advance directive?   No - Patient declined  No - Patient declined   Copy of Healthcare Power of Attorney in Chart? No - copy requested No - copy requested No - copy requested  No - copy requested No - copy requested    Current Medications (verified) Outpatient Encounter Medications as of 02/20/2023  Medication Sig   AMBULATORY NON FORMULARY MEDICATION Allergy Shots Every 3 weeks   Carboxymethylcellulose Sodium (LUBRICATING PLUS EYE DROPS OP) Apply 1 drop to eye 2 (two) times daily.   celecoxib (CELEBREX) 200 MG capsule Take 200 mg by mouth daily.   EPINEPHrine  0.3 mg/0.3 mL IJ SOAJ injection Inject 0.3 mLs (0.3 mg total) into the muscle once as needed for up to 1 dose for anaphylaxis.   hydrochlorothiazide  (HYDRODIURIL ) 25 MG tablet Take 1 tablet (25 mg total) by mouth daily.   losartan  (COZAAR ) 100 MG tablet Take 1 tablet (100 mg total) by mouth daily.   montelukast  (SINGULAIR ) 10 MG tablet 1 tablet by mouth at bedtime as needed for allergy symptoms.   Multiple  Vitamin (MULTIVITAMIN) tablet Take 1 tablet by mouth daily.   Multiple Vitamins-Minerals (PRESERVISION AREDS) CAPS Take by mouth.   Olopatadine -Mometasone (RYALTRIS ) 665-25 MCG/ACT SUSP Place 1-2 sprays into the nose in the morning and at bedtime.   omeprazole  (PRILOSEC) 40 MG capsule Take 1 capsule (40 mg total) by mouth daily as needed.   Propylene Glycol (SYSTANE BALANCE) 0.6 % SOLN Apply to eye.   rosuvastatin  (CRESTOR ) 5 MG tablet Take 1 tablet (5 mg total) by mouth every other day.   Turmeric (QC TUMERIC COMPLEX PO) Take 1,000 mg by mouth in the morning and at bedtime.   Zinc 50 MG TABS Take by mouth.   No facility-administered encounter medications on file as of 02/20/2023.    Allergies (verified) Lisinopril    History: Past Medical History:  Diagnosis Date   Allergies    Allergy    Aortic atherosclerosis (HCC)    Arthritis    Cataract    Diverticulosis    Essential hypertension    GERD (gastroesophageal reflux disease)    Prostate cancer (HCC)    Remission   Spondylosis    Past Surgical History:  Procedure Laterality Date   KNEE SURGERY Bilateral 2015 & 2014   SHOULDER SURGERY Right 2016   TONSILLECTOMY     Family History  Problem Relation Age of Onset   Stroke Mother  Dementia Mother    Hypotension Mother    Allergies Father    COPD Father        never smoker   Rheum arthritis Father    Glaucoma Father    Glaucoma Paternal Grandmother    Allergic rhinitis Neg Hx    Angioedema Neg Hx    Asthma Neg Hx    Eczema Neg Hx    Immunodeficiency Neg Hx    Urticaria Neg Hx    Colon cancer Neg Hx    Stomach cancer Neg Hx    Esophageal cancer Neg Hx    Colon polyps Neg Hx    Rectal cancer Neg Hx    Social History   Socioeconomic History   Marital status: Divorced    Spouse name: Not on file   Number of children: 0   Years of education: Not on file   Highest education level: Not on file  Occupational History   Occupation: retired  Tobacco Use   Smoking  status: Never   Smokeless tobacco: Never  Vaping Use   Vaping status: Never Used  Substance and Sexual Activity   Alcohol use: Not Currently    Comment: none in 2 years 06/25/2021   Drug use: No   Sexual activity: Yes  Other Topics Concern   Not on file  Social History Narrative   Retired - Aeronautical engineer    Divorced x 4    Two children - 2 girls both live Designer, multimedia.       Social Drivers of Corporate investment banker Strain: Low Risk  (02/20/2023)   Overall Financial Resource Strain (CARDIA)    Difficulty of Paying Living Expenses: Not hard at all  Food Insecurity: No Food Insecurity (02/20/2023)   Hunger Vital Sign    Worried About Running Out of Food in the Last Year: Never true    Ran Out of Food in the Last Year: Never true  Transportation Needs: No Transportation Needs (02/20/2023)   PRAPARE - Administrator, Civil Service (Medical): No    Lack of Transportation (Non-Medical): No  Physical Activity: Inactive (02/11/2022)   Exercise Vital Sign    Days of Exercise per Week: 0 days    Minutes of Exercise per Session: 0 min  Stress: No Stress Concern Present (02/20/2023)   [employer] of Occupational Health - Occupational Stress Questionnaire    Feeling of Stress : Not at all  Social Connections: Moderately Integrated (02/20/2023)   Social Connection and Isolation Panel [NHANES]    Frequency of Communication with Friends and Family: More than three times a week    Frequency of Social Gatherings with Friends and Family: More than three times a week    Attends Religious Services: More than 4 times per year    Active Member of [employer] or Organizations: Yes    Attends Engineer, structural: More than 4 times per year    Marital Status: Divorced    Tobacco Counseling Counseling given: Not Answered   Clinical Intake:   Activities of Daily Living    02/20/2023    2:29 PM  In your present state of health, do you have any difficulty performing the following  activities:  Hearing? 0  Vision? 0  Difficulty concentrating or making decisions? 0  Walking or climbing stairs? 0  Dressing or bathing? 0  Doing errands, shopping? 0  Preparing Food and eating ? N  Using the Toilet? N  In the past six months, have you  accidently leaked urine? N  Do you have problems with loss of bowel control? N  Managing your Medications? N  Managing your Finances? N  Housekeeping or managing your Housekeeping? N    Patient Care Team: Alto Atta, NP as PCP - General (Family Medicine) Alver Austin, [facility] (Inactive) as Pharmacist (Pharmacist) Ortho, [facility] (Orthopedic Surgery)  Indicate any recent Medical Services you may have received from other than [facility] providers in the past year (date may be approximate).     Assessment:   This is a routine wellness examination for [place].  Hearing/Vision screen Hearing Screening - Comments:: Denies hearing difficulties   Vision Screening - Comments:: Wears rx glasses - up to date with routine eye exams with Dr Augustus Ledger    Goals Addressed               This Visit's Progress     Increase physical activity (pt-stated)        Live to see my next birthday.       Depression Screen    02/20/2023    2:18 PM 02/11/2022    9:05 AM 10/20/2021   11:05 AM 01/28/2021    8:29 AM 02/13/2020   10:49 AM 01/25/2018    2:32 PM 10/24/2017    5:10 PM  PHQ 2/9 Scores  PHQ - 2 Score 0 0 0 0 0 0 0  PHQ- 9 Score   0        Fall Risk    02/20/2023    2:30 PM 02/11/2022    9:06 AM 02/07/2022    9:01 AM 10/20/2021   11:06 AM 01/28/2021    8:33 AM  Fall Risk   Falls in the past year? 0 0 0 0   Number falls in past yr: 0 0  0 0  Injury with Fall? 0 0  0 0  Risk for fall due to : No Fall Risks No Fall Risks     Follow up Falls prevention discussed;Falls evaluation completed Falls prevention discussed       MEDICARE RISK AT HOME: Medicare Risk at Home Any stairs in or around the home?: No If so, are there any without  handrails?: No Home free of loose throw rugs in walkways, pet beds, electrical cords, etc?: Yes Adequate lighting in your home to reduce risk of falls?: Yes Life alert?: No Use of a cane, walker or w/c?: No Grab bars in the bathroom?: Yes Shower chair or bench in shower?: No Elevated toilet seat or a handicapped toilet?: No  TIMED UP AND GO:  Was the test performed?  Yes  Length of time to ambulate 10 feet: 10 sec Gait slow and steady with assistive device    Cognitive Function:        02/20/2023    2:31 PM 02/11/2022    9:08 AM 01/28/2021    8:36 AM  6CIT Screen  What Year? 0 points 0 points 0 points  What month? 0 points 0 points 0 points  What time? 0 points 0 points 0 points  Count back from 20 0 points 0 points 0 points  Months in reverse 0 points 0 points 0 points  Repeat phrase 0 points 0 points 0 points  Total Score 0 points 0 points 0 points    Immunizations Immunization History  Administered Date(s) Administered   Fluad Quad(high Dose 65+) 02/13/2020, 10/20/2021   Fluad Trivalent(High Dose 65+) 01/03/2023   Influenza, High Dose Seasonal PF 11/03/2017   MODERNA COVID-19  SARS-COV-2 PEDS BIVALENT BOOSTER 81yr-48yr 12/21/2019   Moderna SARS-COV2 Booster Vaccination 12/21/2019   Moderna Sars-Covid-2 Vaccination 02/22/2019, 03/22/2019   Pneumococcal Conjugate-13 03/16/2016   Pneumococcal Polysaccharide-23 01/25/2018    TDAP status: Due, Education has been provided regarding the importance of this vaccine. Advised may receive this vaccine at local pharmacy or Health Dept. Aware to provide a copy of the vaccination record if obtained from local pharmacy or Health Dept. Verbalized acceptance and understanding.  Flu Vaccine status: Up to date  Pneumococcal vaccine status: Up to date  Covid-19 vaccine status: Declined, Education has been provided regarding the importance of this vaccine but patient still declined. Advised may receive this vaccine at local pharmacy or  Health Dept.or vaccine clinic. Aware to provide a copy of the vaccination record if obtained from local pharmacy or Health Dept. Verbalized acceptance and understanding.  Qualifies for Shingles Vaccine? Yes   Zostavax completed No   Shingrix Completed?: No.    Education has been provided regarding the importance of this vaccine. Patient has been advised to call insurance company to determine out of pocket expense if they have not yet received this vaccine. Advised may also receive vaccine at local pharmacy or Health Dept. Verbalized acceptance and understanding.  Screening Tests Health Maintenance  Topic Date Due   DTaP/Tdap/Td (1 - Tdap) Never done   Zoster Vaccines- Shingrix (1 of 2) Never done   COVID-19 Vaccine (5 - 2024-25 season) 09/11/2022   Medicare Annual Wellness (AWV)  02/20/2024   Pneumonia Vaccine 6+ Years old  Completed   INFLUENZA VACCINE  Completed   Hepatitis C Screening  Completed   HPV VACCINES  Aged Out   Colonoscopy  Discontinued    Health Maintenance  Health Maintenance Due  Topic Date Due   DTaP/Tdap/Td (1 - Tdap) Never done   Zoster Vaccines- Shingrix (1 of 2) Never done   COVID-19 Vaccine (5 - 2024-25 season) 09/11/2022     Additional Screening:  Hepatitis C Screening: does qualify; Completed 07/24/20  Vision Screening: Recommended annual ophthalmology exams for early detection of glaucoma and other disorders of the eye. Is the patient up to date with their annual eye exam?  Yes  Who is the provider or what is the name of the office in which the patient attends annual eye exams? Dr Augustus Ledger If pt is not established with a provider, would they like to be referred to a provider to establish care? No .   Dental Screening: Recommended annual dental exams for proper oral hygiene   Community Resource Referral / Chronic Care Management:  CRR required this visit?  No   CCM required this visit?  No     Plan:     I have personally reviewed and noted  the following in the patient's chart:   Medical and social history Use of alcohol, tobacco or illicit drugs  Current medications and supplements including opioid prescriptions. Patient is not currently taking opioid prescriptions. Functional ability and status Nutritional status Physical activity Advanced directives List of other physicians Hospitalizations, surgeries, and ER visits in previous 12 months Vitals Screenings to include cognitive, depression, and falls Referrals and appointments  In addition, I have reviewed and discussed with patient certain preventive protocols, quality metrics, and best practice recommendations. A written personalized care plan for preventive services as well as general preventive health recommendations were provided to patient.     Dewayne Ford, LPN   1/61/0960   After Visit Summary: (In Person-Printed) AVS printed and given  to the patient  Nurse Notes: None

## 2023-02-20 NOTE — Patient Instructions (Addendum)
 [name] , Thank you for taking time to come for your Medicare Wellness Visit. I appreciate your ongoing commitment to your health goals. Please review the following plan we discussed and let me know if I can assist you in the future.   Referrals/Orders/Follow-Ups/Clinician Recommendations:   This is a list of the screening recommended for you and due dates:  Health Maintenance  Topic Date Due   DTaP/Tdap/Td vaccine (1 - Tdap) Never done   Zoster (Shingles) Vaccine (1 of 2) Never done   COVID-19 Vaccine (5 - 2024-25 season) 09/11/2022   Medicare Annual Wellness Visit  02/20/2024   Pneumonia Vaccine  Completed   Flu Shot  Completed   Hepatitis C Screening  Completed   HPV Vaccine  Aged Out   Colon Cancer Screening  Discontinued    Advanced directives: (Copy Requested) Please bring a copy of your health care power of attorney and living will to the office to be added to your chart at your convenience.  Next Medicare Annual Wellness Visit scheduled for next year: Yes

## 2023-02-23 DIAGNOSIS — C61 Malignant neoplasm of prostate: Secondary | ICD-10-CM | POA: Diagnosis not present

## 2023-03-20 ENCOUNTER — Other Ambulatory Visit: Payer: Self-pay | Admitting: Family

## 2023-03-21 ENCOUNTER — Ambulatory Visit (INDEPENDENT_AMBULATORY_CARE_PROVIDER_SITE_OTHER): Payer: Self-pay | Admitting: *Deleted

## 2023-03-21 DIAGNOSIS — J309 Allergic rhinitis, unspecified: Secondary | ICD-10-CM

## 2023-03-29 ENCOUNTER — Other Ambulatory Visit: Payer: Self-pay | Admitting: Adult Health

## 2023-03-29 DIAGNOSIS — I10 Essential (primary) hypertension: Secondary | ICD-10-CM

## 2023-03-29 MED ORDER — LOSARTAN POTASSIUM 100 MG PO TABS
100.0000 mg | ORAL_TABLET | Freq: Every day | ORAL | 0 refills | Status: DC
Start: 2023-03-29 — End: 2023-07-04

## 2023-03-29 NOTE — Telephone Encounter (Signed)
 Copied from CRM [id number]. Topic: Clinical - Medication Refill >> Mar 29, 2023 10:50 AM Alcus Dad wrote: Most Recent Primary Care Visit:  Provider: Tillie Rung  Department: [facility]  Visit Type: MEDICARE AWV, SEQUENTIAL  Date: 02/20/2023 Patient has been out of the medication for 5 days Medication: losartan (COZAAR) 100 MG tablet  Has the patient contacted their pharmacy? Yes (Agent: If no, request that the patient contact the pharmacy for the refill. If patient does not wish to contact the pharmacy document the reason why and proceed with request.) (Agent: If yes, when and what did the pharmacy advise?)  Is this the correct pharmacy for this prescription? Yes If no, delete pharmacy and type the correct one.  This is the patient's preferred pharmacy:  [facility] [address] [place] - [address] [facility] [place] [address] Phone: [phone] Fax: [phone]  [facility] [place], [place] - [phone] [place] [address] Phone: [phone] Fax: [phone]   Has the prescription been filled recently? No  Is the patient out of the medication? Yes  Has the patient been seen for an appointment in the last year OR does the patient have an upcoming appointment? Yes  Can we respond through MyChart? No  Agent: Please be advised that Rx refills may take up to 3 business days. We ask that you follow-up with your pharmacy.

## 2023-04-05 ENCOUNTER — Ambulatory Visit: Admitting: Surgical

## 2023-04-05 ENCOUNTER — Other Ambulatory Visit: Payer: Self-pay

## 2023-04-05 ENCOUNTER — Other Ambulatory Visit (INDEPENDENT_AMBULATORY_CARE_PROVIDER_SITE_OTHER): Payer: Self-pay

## 2023-04-05 ENCOUNTER — Encounter: Payer: Self-pay | Admitting: Surgical

## 2023-04-05 DIAGNOSIS — M19011 Primary osteoarthritis, right shoulder: Secondary | ICD-10-CM | POA: Diagnosis not present

## 2023-04-05 DIAGNOSIS — M25511 Pain in right shoulder: Secondary | ICD-10-CM | POA: Diagnosis not present

## 2023-04-07 ENCOUNTER — Encounter: Payer: Self-pay | Admitting: Surgical

## 2023-04-07 MED ORDER — METHYLPREDNISOLONE ACETATE 40 MG/ML IJ SUSP
40.0000 mg | INTRAMUSCULAR | Status: AC | PRN
  Administered 2023-04-05: 40 mg via INTRA_ARTICULAR

## 2023-04-07 MED ORDER — BUPIVACAINE HCL 0.25 % IJ SOLN
9.0000 mL | INTRAMUSCULAR | Status: AC | PRN
  Administered 2023-04-05: 9 mL via INTRA_ARTICULAR

## 2023-04-07 MED ORDER — LIDOCAINE HCL 1 % IJ SOLN
5.0000 mL | INTRAMUSCULAR | Status: AC | PRN
  Administered 2023-04-05: 5 mL

## 2023-04-07 NOTE — Progress Notes (Signed)
 Office Visit Note   Patient: Marcus Gomez.           Date of Birth: 06-Jan-1946           MRN: [id number] Visit Date: 04/05/2023 Requested by: Shirline Frees, NP [address] [place],  [place] [address] PCP: Shirline Frees, NP  Subjective: Chief Complaint  Patient presents with   Right Shoulder - Pain    HPI: Marcus Ingrum. is a 78 y.o. male who presents to the office reporting right shoulder pain.  Patient complains of pain in the lateral shoulder.  He states that he woke up Sunday with significant shoulder pain that has somewhat eased off since onset but is bothering him enough to seek intervention.  He was riding a tractor on Saturday and really did not have any history of injury that he can recall.  Does have some scapular pain and neck pain.  Has very occasional radicular pain down to his hand at times.  No numbness or tingling or burning sensation.  Has had 1 prior surgery to his shoulder about 10 years ago that sounds like arthroscopic debridement.  Has occasional left shoulder pain.  No history of neck surgery.  He is left-hand dominant.  He is retired and a former Administrator.  He has a history of chronic right shoulder glenohumeral dislocations since he was 78 years old and played football.  He also has a history of gout with 1 prior gout attack.  No history of diabetes..                ROS: All systems reviewed are negative as they relate to the chief complaint within the history of present illness.  Patient denies fevers or chills.  Assessment & Plan: Visit Diagnoses:  1. Glenohumeral arthritis, right   2. Acute pain of right shoulder     Plan: Impression is 78 year old male who has right shoulder glenohumeral arthritis.  Has history of multiple dislocations since he was 78 years old.  Overall has done well with tolerating his shoulder pain until he woke up with significant pain on Sunday with no inciting event.  Does have some associated  scapular and neck pain which may represent referred pain from the cervical spine in regards to this new onset of shoulder pain.  After discussion of options with Dannielle Huh, he would like to try right shoulder injection.  This was administered under ultrasound guidance and patient tolerated glenohumeral injection without complication.  We will see how this does for him over the next couple weeks and if little to no improvement, next step would be further evaluation of potential cervical spine pathology.  Follow-up in 4 weeks.  Follow-Up Instructions: No follow-ups on file.   Orders:  Orders Placed This Encounter  Procedures   XR Shoulder Right   US Guided Needle Placement - No Linked Charges   No orders of the defined types were placed in this encounter.     Procedures: Large Joint Inj: R glenohumeral on 04/05/2023 2:34 PM Details: 22 G 3.5 in needle, ultrasound-guided posterior approach Medications: 5 mL lidocaine 1 %; 9 mL bupivacaine 0.25 %; 40 mg methylPREDNISolone acetate 40 MG/ML Outcome: tolerated well, no immediate complications Procedure, treatment alternatives, risks and benefits explained, specific risks discussed. Consent was given by the patient. Patient was prepped and draped in the usual sterile fashion.       Clinical Data: No additional findings.  Objective: Vital Signs: There were no vitals taken for  this visit.  Physical Exam:  Constitutional: Patient appears well-developed HEENT:  Head: Normocephalic Eyes:EOM are normal Neck: Normal range of motion Cardiovascular: Normal rate Pulmonary/chest: Effort normal Neurologic: Patient is alert Skin: Skin is warm Psychiatric: Patient has normal mood and affect  Ortho Exam: Ortho exam demonstrates right shoulder with 10 degrees X rotation, 50 degrees abduction, 90 degrees forward elevation passively and actively.  This compared with the left shoulder with 50 degrees X rotation, 110 degrees abduction, 180 degrees forward  elevation passively and actively.  Axillary nerve is intact with deltoid firing in the right shoulder.  Excellent rotator cuff strength of supra, infra, subscap rated 5/5.  Intact EPL, FPL, finger abduction, pronation/supination, bicep, tricep, deltoid rated 5/5 of the right arm.  2+ radial pulse of the right upper extremity.  There is grinding and crepitus noted with passive motion of the right shoulder consistent with osteoarthritis.  Specialty Comments:  No specialty comments available.  Imaging: No results found.   PMFS History: Patient Active Problem List   Diagnosis Date Noted   Acute non-recurrent frontal sinusitis 01/14/2022   GERD (gastroesophageal reflux disease) 07/24/2017   Perennial and seasonal allergic rhinitis 03/13/2017   Lung field abnormal finding on examination 08/31/2015   Essential hypertension 08/31/2015   Past Medical History:  Diagnosis Date   Allergies    Allergy    Aortic atherosclerosis (HCC)    Arthritis    Cataract    Diverticulosis    Essential hypertension    GERD (gastroesophageal reflux disease)    Prostate cancer (HCC)    Remission   Spondylosis     Family History  Problem Relation Age of Onset   Stroke Mother    Dementia Mother    Hypotension Mother    Allergies Father    COPD Father        never smoker   Rheum arthritis Father    Glaucoma Father    Glaucoma Paternal Grandmother    Allergic rhinitis Neg Hx    Angioedema Neg Hx    Asthma Neg Hx    Eczema Neg Hx    Immunodeficiency Neg Hx    Urticaria Neg Hx    Colon cancer Neg Hx    Stomach cancer Neg Hx    Esophageal cancer Neg Hx    Colon polyps Neg Hx    Rectal cancer Neg Hx     Past Surgical History:  Procedure Laterality Date   KNEE SURGERY Bilateral 2015 & 2014   SHOULDER SURGERY Right 2016   TONSILLECTOMY     Social History   Occupational History   Occupation: retired  Tobacco Use   Smoking status: Never   Smokeless tobacco: Never  Vaping Use   Vaping  status: Never Used  Substance and Sexual Activity   Alcohol use: Not Currently    Comment: none in 2 years 06/25/2021   Drug use: No   Sexual activity: Yes

## 2023-04-10 ENCOUNTER — Ambulatory Visit: Admitting: Surgical

## 2023-04-18 ENCOUNTER — Ambulatory Visit (INDEPENDENT_AMBULATORY_CARE_PROVIDER_SITE_OTHER): Payer: Self-pay

## 2023-04-18 DIAGNOSIS — J309 Allergic rhinitis, unspecified: Secondary | ICD-10-CM | POA: Diagnosis not present

## 2023-04-26 ENCOUNTER — Ambulatory Visit (INDEPENDENT_AMBULATORY_CARE_PROVIDER_SITE_OTHER): Payer: Self-pay

## 2023-04-26 DIAGNOSIS — J309 Allergic rhinitis, unspecified: Secondary | ICD-10-CM

## 2023-05-02 DIAGNOSIS — H353132 Nonexudative age-related macular degeneration, bilateral, intermediate dry stage: Secondary | ICD-10-CM | POA: Diagnosis not present

## 2023-05-02 DIAGNOSIS — H31091 Other chorioretinal scars, right eye: Secondary | ICD-10-CM | POA: Diagnosis not present

## 2023-05-02 DIAGNOSIS — H40013 Open angle with borderline findings, low risk, bilateral: Secondary | ICD-10-CM | POA: Diagnosis not present

## 2023-05-02 DIAGNOSIS — H25813 Combined forms of age-related cataract, bilateral: Secondary | ICD-10-CM | POA: Diagnosis not present

## 2023-05-04 ENCOUNTER — Ambulatory Visit (INDEPENDENT_AMBULATORY_CARE_PROVIDER_SITE_OTHER): Payer: Self-pay

## 2023-05-04 DIAGNOSIS — J309 Allergic rhinitis, unspecified: Secondary | ICD-10-CM | POA: Diagnosis not present

## 2023-05-29 ENCOUNTER — Ambulatory Visit (INDEPENDENT_AMBULATORY_CARE_PROVIDER_SITE_OTHER): Payer: Self-pay

## 2023-05-29 DIAGNOSIS — J309 Allergic rhinitis, unspecified: Secondary | ICD-10-CM | POA: Diagnosis not present

## 2023-06-20 ENCOUNTER — Encounter: Payer: Self-pay | Admitting: Family Medicine

## 2023-06-20 ENCOUNTER — Ambulatory Visit: Payer: Self-pay | Admitting: Family Medicine

## 2023-06-20 ENCOUNTER — Ambulatory Visit (INDEPENDENT_AMBULATORY_CARE_PROVIDER_SITE_OTHER): Admitting: Family Medicine

## 2023-06-20 VITALS — BP 128/70 | HR 62 | Resp 16 | Ht 70.0 in | Wt 234.0 lb

## 2023-06-20 DIAGNOSIS — M109 Gout, unspecified: Secondary | ICD-10-CM | POA: Diagnosis not present

## 2023-06-20 LAB — URIC ACID: Uric Acid, Serum: 4.5 mg/dL (ref 4.0–7.8)

## 2023-06-20 MED ORDER — PREDNISONE 20 MG PO TABS: 40.0000 mg | ORAL_TABLET | Freq: Every day | ORAL | 0 refills | Status: AC

## 2023-06-20 MED ORDER — COLCHICINE 0.6 MG PO TABS: 0.6000 mg | ORAL_TABLET | Freq: Every day | ORAL | 0 refills | Status: DC | PRN

## 2023-06-20 MED ORDER — ALLOPURINOL 100 MG PO TABS: 100.0000 mg | ORAL_TABLET | Freq: Every day | ORAL | 0 refills | Status: DC

## 2023-06-20 NOTE — Patient Instructions (Addendum)
 A few things to remember from today's visit:  Acute gout of right wrist, unspecified cause - Plan: Uric acid, predniSONE  (DELTASONE ) 20 MG tablet, colchicine 0.6 MG tablet Start Prednisone , take it with food, breakfast. Colchicine 1 tab daily for 5-7 days then as needed for future attacks (2 at the same time once). Allopurinol to prevent future attacks.  Do not use My Chart to request refills or for acute issues that need immediate attention. If you send a my chart message, it may take a few days to be addressed, specially if I am not in the office.  Please be sure medication list is accurate. If a new problem present, please set up appointment sooner than planned today.

## 2023-06-20 NOTE — Progress Notes (Signed)
 ACUTE VISIT Chief Complaint  Patient presents with   Gout    Right hand, started Sunday    HPI: [name]. is a 78 y.o. left-handed male with a PMHx significant for HTN, GERD, and seasonal allergic rhinitis, who is here today complaining of right wrist pain, which he thinks is gout.  He has had pain, edema,and mild erythema since 6/8. Rapid onset. Pain is getting worse. He has had 3 similar episodes in the past 6 months: twice in his right hand and once in his shoulder.  He rates the pain as a 10/10, and localizes it to his wrist. Also has some redness and says his wrist is hot.   No new medications in the last six months.  Denies any recent trauma.  HTN on Losartan  and hydrochlorothiazide . No recent dietary changes.  Lab Results  Component Value Date   NA 137 01/03/2023   CL 101 01/03/2023   K 4.4 01/03/2023   CO2 28 01/03/2023   BUN 15 01/03/2023   CREATININE 0.90 01/03/2023   GFR 82.19 01/03/2023   CALCIUM  9.4 01/03/2023   ALBUMIN 4.2 01/03/2023   GLUCOSE 103 (H) 01/03/2023   Review of Systems  Constitutional:  Negative for appetite change and fever.  Respiratory:  Negative for shortness of breath.   Cardiovascular:  Negative for chest pain and leg swelling.  Gastrointestinal:  Negative for abdominal pain and nausea.  Genitourinary:  Negative for decreased urine volume, dysuria and hematuria.  Skin:  Negative for rash.  Neurological:  Negative for syncope, weakness and numbness.  See other pertinent positives and negatives in HPI.  Current Outpatient Medications on File Prior to Visit  Medication Sig Dispense Refill   AMBULATORY NON FORMULARY MEDICATION Allergy Shots Every 3 weeks     Carboxymethylcellulose Sodium (LUBRICATING PLUS EYE DROPS OP) Apply 1 drop to eye 2 (two) times daily.     celecoxib (CELEBREX) 200 MG capsule Take 200 mg by mouth daily.     EPINEPHrine  0.3 mg/0.3 mL IJ SOAJ injection Inject 0.3 mLs (0.3 mg total) into the  muscle once as needed for up to 1 dose for anaphylaxis. 0.3 mL 1   hydrochlorothiazide  (HYDRODIURIL ) 25 MG tablet Take 1 tablet (25 mg total) by mouth daily. 90 tablet 3   losartan  (COZAAR ) 100 MG tablet Take 1 tablet (100 mg total) by mouth daily. 90 tablet 0   montelukast  (SINGULAIR ) 10 MG tablet 1 tablet by mouth at bedtime as needed for allergy symptoms. 90 tablet 2   Multiple Vitamin (MULTIVITAMIN) tablet Take 1 tablet by mouth daily.     Multiple Vitamins-Minerals (PRESERVISION AREDS) CAPS Take by mouth.     Olopatadine -Mometasone (RYALTRIS ) 665-25 MCG/ACT SUSP Place 1-2 sprays into the nose in the morning and at bedtime. 29 g 5   omeprazole  (PRILOSEC) 40 MG capsule TAKE 1 CAPSULE BY MOUTH ONCE DAILY AS NEEDED 90 capsule 1   Propylene Glycol (SYSTANE BALANCE) 0.6 % SOLN Apply to eye.     rosuvastatin  (CRESTOR ) 5 MG tablet Take 1 tablet (5 mg total) by mouth every other day. 45 tablet 3   Turmeric (QC TUMERIC COMPLEX PO) Take 1,000 mg by mouth in the morning and at bedtime.     Zinc 50 MG TABS Take by mouth.     No current facility-administered medications on file prior to visit.    Past Medical History:  Diagnosis Date   Allergies    Allergy    Aortic atherosclerosis (HCC)  Arthritis    Cataract    Diverticulosis    Essential hypertension    GERD (gastroesophageal reflux disease)    Prostate cancer (HCC)    Remission   Spondylosis    Allergies  Allergen Reactions   Lisinopril  Cough    Social History   Socioeconomic History   Marital status: Divorced    Spouse name: Not on file   Number of children: 0   Years of education: Not on file   Highest education level: Not on file  Occupational History   Occupation: retired  Tobacco Use   Smoking status: Never   Smokeless tobacco: Never  Vaping Use   Vaping status: Never Used  Substance and Sexual Activity   Alcohol use: Not Currently    Comment: none in 2 years 06/25/2021   Drug use: No   Sexual activity: Yes   Other Topics Concern   Not on file  Social History Narrative   Retired - Aeronautical engineer    Divorced x 4    Two children - 2 girls both live Designer, multimedia.       Social Drivers of Corporate investment banker Strain: Low Risk  (02/20/2023)   Overall Financial Resource Strain (CARDIA)    Difficulty of Paying Living Expenses: Not hard at all  Food Insecurity: No Food Insecurity (02/20/2023)   Hunger Vital Sign    Worried About Running Out of Food in the Last Year: Never true    Ran Out of Food in the Last Year: Never true  Transportation Needs: No Transportation Needs (02/20/2023)   PRAPARE - Administrator, Civil Service (Medical): No    Lack of Transportation (Non-Medical): No  Physical Activity: Inactive (02/11/2022)   Exercise Vital Sign    Days of Exercise per Week: 0 days    Minutes of Exercise per Session: 0 min  Stress: No Stress Concern Present (02/20/2023)   [employer] of Occupational Health - Occupational Stress Questionnaire    Feeling of Stress : Not at all  Social Connections: Moderately Integrated (02/20/2023)   Social Connection and Isolation Panel [NHANES]    Frequency of Communication with Friends and Family: More than three times a week    Frequency of Social Gatherings with Friends and Family: More than three times a week    Attends Religious Services: More than 4 times per year    Active Member of Clubs or Organizations: Yes    Attends Banker Meetings: More than 4 times per year    Marital Status: Divorced    Vitals:   06/20/23 1322  BP: 128/70  Pulse: 62  Resp: 16  SpO2: 98%   Body mass index is 33.58 kg/m.  Physical Exam Vitals and nursing note reviewed.  Constitutional:      General: He is not in acute distress.    Appearance: He is well-developed.  HENT:     Head: Normocephalic and atraumatic.  Eyes:     Conjunctiva/sclera: Conjunctivae normal.  Cardiovascular:     Rate and Rhythm: Normal rate and regular rhythm.      Pulses:          Radial pulses are 2+ on the right side.  Pulmonary:     Effort: Pulmonary effort is normal. No respiratory distress.     Breath sounds: Normal breath sounds.  Musculoskeletal:     Right wrist: Swelling and tenderness present. Decreased range of motion. Normal pulse.     Right hand: Decreased range of  motion. Normal capillary refill.     Comments: Tenderness with palpation and movement, local heat, mild erythema, worse ulnar and ulnocarpal joints.  Skin:    General: Skin is warm.     Findings: No erythema or rash.  Neurological:     Mental Status: He is alert and oriented to person, place, and time.     Cranial Nerves: No cranial nerve deficit.     Gait: Gait normal.  Psychiatric:        Mood and Affect: Mood and affect normal.   ASSESSMENT AND PLAN:  Marcus Gomez was seen today for gout attack.   Acute gout of right wrist, unspecified cause -     Uric acid; Future -     predniSONE ; Take 2 tablets (40 mg total) by mouth daily with breakfast for 5 days.  Dispense: 10 tablet; Refill: 0 -     Colchicine; Take 1 tablet (0.6 mg total) by mouth daily as needed.  Dispense: 30 tablet; Refill: 0  Other orders -     Allopurinol; Take 1 tablet (100 mg total) by mouth daily.  Dispense: 90 tablet; Refill: 0  We discussed differential Dx, including OA. No hx of trauma, so we decided to hold on imaging. Hx and examination suggest acute gout. Prednisone  40 mg x 5 d and Colchicine 0.6 mg bid for 5 days recommended.  He has had a couple episodes on the past few months, so allopurinol for prevention recommended. Side effects of medications discussed. Low purine diet recommended.  Lab Results  Component Value Date   LABURIC 4.5 06/20/2023   I spent a total of 30 minutes in both face to face and non face to face activities for this visit on the date of this encounter. During this time history was obtained and documented, examination was performed, prior labs reviewed, and  assessment/plan discussed.  Return if symptoms worsen or fail to improve, for keep next appointment.  I, Fritz Jewel Wierda, acting as a scribe for Kristiann Noyce [place], MD., have documented all relevant documentation on the behalf of Marcus Kraemer [place], MD, as directed by  Marcus Slatten [place], MD while in the presence of Theophilus Walz [place], MD.   I, Ramie Erman [place], MD, have reviewed all documentation for this visit. The documentation on 06/20/23 for the exam, diagnosis, procedures, and orders are all accurate and complete.  Tocara Mennen G. [place], MD  [facility]. [place] office.

## 2023-06-26 ENCOUNTER — Ambulatory Visit (INDEPENDENT_AMBULATORY_CARE_PROVIDER_SITE_OTHER): Payer: Self-pay

## 2023-06-26 DIAGNOSIS — J309 Allergic rhinitis, unspecified: Secondary | ICD-10-CM | POA: Diagnosis not present

## 2023-07-04 ENCOUNTER — Encounter: Payer: Self-pay | Admitting: Adult Health

## 2023-07-04 ENCOUNTER — Ambulatory Visit (INDEPENDENT_AMBULATORY_CARE_PROVIDER_SITE_OTHER): Admitting: Adult Health

## 2023-07-04 VITALS — BP 118/62 | HR 97 | Temp 98.2°F | Ht 70.0 in | Wt 231.0 lb

## 2023-07-04 DIAGNOSIS — M109 Gout, unspecified: Secondary | ICD-10-CM

## 2023-07-04 DIAGNOSIS — I10 Essential (primary) hypertension: Secondary | ICD-10-CM

## 2023-07-04 MED ORDER — LOSARTAN POTASSIUM 100 MG PO TABS
100.0000 mg | ORAL_TABLET | Freq: Every day | ORAL | 1 refills | Status: AC
Start: 2023-07-04 — End: ?

## 2023-07-04 NOTE — Progress Notes (Signed)
 Subjective:    Patient ID: Marcus Toribio Caprice Mickey., male    DOB: 29-Sep-1945, 78 y.o.   MRN: [id number]  HPI 78 year old male who  has a past medical history of Allergies, Allergy, Aortic atherosclerosis (HCC), Arthritis, Cataract, Diverticulosis, Essential hypertension, GERD (gastroesophageal reflux disease), Prostate cancer (HCC), and Spondylosis.   He was recently seen by another provider in the office for an acute gout flare. He was prescribed prednisone /colchicine  and then placed on Allopurinol  since he has had multiple gout flares. His gout has resolved but he was advised to follow up.   He also needs a refill of Losartan  100 mg daily.    Review of Systems See HPI  Past Medical History:  Diagnosis Date   Allergies    Allergy    Aortic atherosclerosis (HCC)    Arthritis    Cataract    Diverticulosis    Essential hypertension    GERD (gastroesophageal reflux disease)    Prostate cancer (HCC)    Remission   Spondylosis     Social History   Socioeconomic History   Marital status: Divorced    Spouse name: Not on file   Number of children: 0   Years of education: Not on file   Highest education level: Not on file  Occupational History   Occupation: retired  Tobacco Use   Smoking status: Never   Smokeless tobacco: Never  Vaping Use   Vaping status: Never Used  Substance and Sexual Activity   Alcohol use: Not Currently    Comment: none in 2 years 06/25/2021   Drug use: No   Sexual activity: Yes  Other Topics Concern   Not on file  Social History Narrative   Retired - Aeronautical engineer    Divorced x 4    Two children - 2 girls both live Designer, multimedia.       Social Drivers of Corporate investment banker Strain: Low Risk  (02/20/2023)   Overall Financial Resource Strain (CARDIA)    Difficulty of Paying Living Expenses: Not hard at all  Food Insecurity: No Food Insecurity (02/20/2023)   Hunger Vital Sign    Worried About Running Out of Food in the Last Year: Never true     Ran Out of Food in the Last Year: Never true  Transportation Needs: No Transportation Needs (02/20/2023)   PRAPARE - Administrator, Civil Service (Medical): No    Lack of Transportation (Non-Medical): No  Physical Activity: Inactive (02/11/2022)   Exercise Vital Sign    Days of Exercise per Week: 0 days    Minutes of Exercise per Session: 0 min  Stress: No Stress Concern Present (02/20/2023)   [employer] of Occupational Health - Occupational Stress Questionnaire    Feeling of Stress : Not at all  Social Connections: Moderately Integrated (02/20/2023)   Social Connection and Isolation Panel    Frequency of Communication with Friends and Family: More than three times a week    Frequency of Social Gatherings with Friends and Family: More than three times a week    Attends Religious Services: More than 4 times per year    Active Member of [employer] or Organizations: Yes    Attends Banker Meetings: More than 4 times per year    Marital Status: Divorced  Intimate Partner Violence: Not At Risk (02/20/2023)   Humiliation, Afraid, Rape, and Kick questionnaire    Fear of Current or Ex-Partner: No    Emotionally Abused: No  Physically Abused: No    Sexually Abused: No    Past Surgical History:  Procedure Laterality Date   KNEE SURGERY Bilateral 2015 & 2014   SHOULDER SURGERY Right 2016   TONSILLECTOMY      Family History  Problem Relation Age of Onset   Stroke Mother    Dementia Mother    Hypotension Mother    Allergies Father    COPD Father        never smoker   Rheum arthritis Father    Glaucoma Father    Glaucoma Paternal Grandmother    Allergic rhinitis Neg Hx    Angioedema Neg Hx    Asthma Neg Hx    Eczema Neg Hx    Immunodeficiency Neg Hx    Urticaria Neg Hx    Colon cancer Neg Hx    Stomach cancer Neg Hx    Esophageal cancer Neg Hx    Colon polyps Neg Hx    Rectal cancer Neg Hx     Allergies  Allergen Reactions   Lisinopril  Cough     Current Outpatient Medications on File Prior to Visit  Medication Sig Dispense Refill   colchicine  0.6 MG tablet Take 1 tablet (0.6 mg total) by mouth daily as needed. 30 tablet 0   EPINEPHrine  0.3 mg/0.3 mL IJ SOAJ injection Inject 0.3 mLs (0.3 mg total) into the muscle once as needed for up to 1 dose for anaphylaxis. 0.3 mL 1   losartan  (COZAAR ) 100 MG tablet Take 1 tablet (100 mg total) by mouth daily. 90 tablet 0   montelukast  (SINGULAIR ) 10 MG tablet 1 tablet by mouth at bedtime as needed for allergy symptoms. 90 tablet 2   Multiple Vitamin (MULTIVITAMIN) tablet Take 1 tablet by mouth daily.     Multiple Vitamins-Minerals (PRESERVISION AREDS) CAPS Take by mouth.     omeprazole  (PRILOSEC) 40 MG capsule TAKE 1 CAPSULE BY MOUTH ONCE DAILY AS NEEDED 90 capsule 1   Propylene Glycol (SYSTANE BALANCE) 0.6 % SOLN Apply to eye.     rosuvastatin  (CRESTOR ) 5 MG tablet Take 1 tablet (5 mg total) by mouth every other day. 45 tablet 3   Turmeric (QC TUMERIC COMPLEX PO) Take 1,000 mg by mouth in the morning and at bedtime.     allopurinol  (ZYLOPRIM ) 100 MG tablet Take 1 tablet (100 mg total) by mouth daily. 90 tablet 0   AMBULATORY NON FORMULARY MEDICATION Allergy Shots Every 3 weeks     Carboxymethylcellulose Sodium (LUBRICATING PLUS EYE DROPS OP) Apply 1 drop to eye 2 (two) times daily.     Olopatadine -Mometasone (RYALTRIS ) 665-25 MCG/ACT SUSP Place 1-2 sprays into the nose in the morning and at bedtime. 29 g 5   Zinc 50 MG TABS Take by mouth.     No current facility-administered medications on file prior to visit.    BP 118/62   Pulse 97   Temp 98.2 F (36.8 C) (Oral)   Ht 5' 10 (1.778 m)   Wt 231 lb (104.8 kg)   SpO2 97%   BMI 33.15 kg/m       Objective:   Physical Exam Vitals and nursing note reviewed.  Constitutional:      Appearance: Normal appearance.   Cardiovascular:     Rate and Rhythm: Normal rate and regular rhythm.     Pulses: Normal pulses.     Heart sounds:  Normal heart sounds.  Pulmonary:     Effort: Pulmonary effort is normal.     Breath  sounds: Normal breath sounds.   Musculoskeletal:        General: No swelling or tenderness.   Skin:    General: Skin is warm and dry.     Findings: No erythema.   Neurological:     General: No focal deficit present.     Mental Status: He is alert and oriented to person, place, and time.   Psychiatric:        Mood and Affect: Mood normal.        Behavior: Behavior normal.        Thought Content: Thought content normal.        Judgment: Judgment normal.        Assessment & Plan:  1. Essential hypertension (Primary) - Will d/c hydrochlorothiazide  and have him monitor his BP at home. He will send me his results via mychart in two weeks  - losartan  (COZAAR ) 100 MG tablet; Take 1 tablet (100 mg total) by mouth daily.  Dispense: 90 tablet; Refill: 1  2. Acute gout of right wrist, unspecified cause - Continue with Allopurinol    Darleene Shape, NP

## 2023-07-12 ENCOUNTER — Telehealth: Payer: Self-pay | Admitting: *Deleted

## 2023-07-12 NOTE — Telephone Encounter (Signed)
 Communication  Reason for CRM: Patient is calling to provider his blood pressure readings for [facility], for the last five days his readings have been 159/75.

## 2023-07-12 NOTE — Telephone Encounter (Signed)
 Pt notified that Darleene is out of the office. Pt also advised to get a few more readings and report the readings on Tuesday. Darleene is wanted 2 weeks worth of BP readings per note. Pt verbalized understanding.

## 2023-07-24 ENCOUNTER — Ambulatory Visit (INDEPENDENT_AMBULATORY_CARE_PROVIDER_SITE_OTHER)

## 2023-07-24 DIAGNOSIS — J309 Allergic rhinitis, unspecified: Secondary | ICD-10-CM

## 2023-07-28 NOTE — Telephone Encounter (Signed)
 PCP call back. Patient upset no response from pcp.    Copied from CRM [id number]. Topic: Clinical - Prescription Issue >> Jul 28, 2023  2:36 PM Marcus Gomez wrote: Reason for CRM: Patient called in stating that Sunday his BP went up to 210/95, so he went back on the blood pressure medication that NP [name] took him off of. Patient states that he came by the office with his blood pressure medication and gave it to the front desk person in the clinic. He stated that he has been waiting for [name] to contact him and he has not yet and he is upset. He wants to know if he should go back on the bp medication due to his prior high readings, but he also said the medication causes gout which is why [name] took him off of it. Patient is requesting a urgent call back and can be reached at [phone].

## 2023-08-01 ENCOUNTER — Other Ambulatory Visit: Payer: Self-pay | Admitting: Adult Health

## 2023-08-01 MED ORDER — SPIRONOLACTONE 25 MG PO TABS
25.0000 mg | ORAL_TABLET | Freq: Every day | ORAL | 0 refills | Status: DC
Start: 2023-08-01 — End: 2023-11-02

## 2023-08-01 NOTE — Telephone Encounter (Signed)
 Patient is aware.  Patient to call back and schedule a follow up visit.

## 2023-08-17 ENCOUNTER — Ambulatory Visit: Payer: Self-pay | Admitting: Adult Health

## 2023-08-17 ENCOUNTER — Ambulatory Visit: Admitting: Adult Health

## 2023-08-17 ENCOUNTER — Encounter: Payer: Self-pay | Admitting: Adult Health

## 2023-08-17 VITALS — BP 130/62 | HR 53 | Temp 97.5°F | Ht 70.0 in | Wt 236.0 lb

## 2023-08-17 DIAGNOSIS — I10 Essential (primary) hypertension: Secondary | ICD-10-CM | POA: Diagnosis not present

## 2023-08-17 DIAGNOSIS — L570 Actinic keratosis: Secondary | ICD-10-CM | POA: Diagnosis not present

## 2023-08-17 LAB — BASIC METABOLIC PANEL WITH GFR
BUN: 18 mg/dL (ref 6–23)
CO2: 21 meq/L (ref 19–32)
Calcium: 9.5 mg/dL (ref 8.4–10.5)
Chloride: 102 meq/L (ref 96–112)
Creatinine, Ser: 0.88 mg/dL (ref 0.40–1.50)
GFR: 82.39 mL/min (ref >60.00)
Glucose, Bld: 88 mg/dL (ref 70–99)
Potassium: 4.3 meq/L (ref 3.5–5.1)
Sodium: 136 meq/L (ref 135–145)

## 2023-08-17 NOTE — Progress Notes (Signed)
 Subjective:    Patient ID: Marcus Gomez., male    DOB: 11/23/1945, 78 y.o.   MRN: [id number]  HPI 78 year old male who  has a past medical history of Allergies, Allergy, Aortic atherosclerosis (HCC), Arthritis, Cataract, Diverticulosis, Essential hypertension, GERD (gastroesophageal reflux disease), Prostate cancer (HCC), and Spondylosis.  He presents to the office today for follow-up regarding hypertension.  He was last seen on July 04, 2023 after recurrent gout flare.  At this time had him stop his HCTZ for concern that this was responsible for recurrence of gout and continue losartan  100 mg daily.  Did check his blood pressures at home with readings between 120 and 200 systolic with most of his readings being in the 140s to 150s.  He was then placed on spironolactone  25 mg about 3 weeks ago.  He has continued to check his BP at home after starting Spirolactone and has been getting readings in the 140-150/70's.  He has had some transient dizziness that lasts a few seconds if he gets up too quickly.   Generally he reports that he was at his barber and she noticed a spot on his scalp that looked funny.    Review of Systems See HPI   Past Medical History:  Diagnosis Date   Allergies    Allergy    Aortic atherosclerosis (HCC)    Arthritis    Cataract    Diverticulosis    Essential hypertension    GERD (gastroesophageal reflux disease)    Prostate cancer (HCC)    Remission   Spondylosis     Social History   Socioeconomic History   Marital status: Divorced    Spouse name: Not on file   Number of children: 0   Years of education: Not on file   Highest education level: Not on file  Occupational History   Occupation: retired  Tobacco Use   Smoking status: Never   Smokeless tobacco: Never  Vaping Use   Vaping status: Never Used  Substance and Sexual Activity   Alcohol use: Not Currently    Comment: none in 2 years 06/25/2021   Drug use: No   Sexual  activity: Yes  Other Topics Concern   Not on file  Social History Narrative   Retired - Aeronautical engineer    Divorced x 4    Two children - 2 girls both live Designer, multimedia.       Social Drivers of Corporate investment banker Strain: Low Risk  (02/20/2023)   Overall Financial Resource Strain (CARDIA)    Difficulty of Paying Living Expenses: Not hard at all  Food Insecurity: No Food Insecurity (02/20/2023)   Hunger Vital Sign    Worried About Running Out of Food in the Last Year: Never true    Ran Out of Food in the Last Year: Never true  Transportation Needs: No Transportation Needs (02/20/2023)   PRAPARE - Administrator, Civil Service (Medical): No    Lack of Transportation (Non-Medical): No  Physical Activity: Inactive (02/11/2022)   Exercise Vital Sign    Days of Exercise per Week: 0 days    Minutes of Exercise per Session: 0 min  Stress: No Stress Concern Present (02/20/2023)   [employer] of Occupational Health - Occupational Stress Questionnaire    Feeling of Stress : Not at all  Social Connections: Moderately Integrated (02/20/2023)   Social Connection and Isolation Panel    Frequency of Communication with Friends and Family: More than  three times a week    Frequency of Social Gatherings with Friends and Family: More than three times a week    Attends Religious Services: More than 4 times per year    Active Member of [employer] or Organizations: Yes    Attends Engineer, structural: More than 4 times per year    Marital Status: Divorced  Intimate Partner Violence: Not At Risk (02/20/2023)   Humiliation, Afraid, Rape, and Kick questionnaire    Fear of Current or Ex-Partner: No    Emotionally Abused: No    Physically Abused: No    Sexually Abused: No    Past Surgical History:  Procedure Laterality Date   KNEE SURGERY Bilateral 2015 & 2014   SHOULDER SURGERY Right 2016   TONSILLECTOMY      Family History  Problem Relation Age of Onset   Stroke Mother     Dementia Mother    Hypotension Mother    Allergies Father    COPD Father        never smoker   Rheum arthritis Father    Glaucoma Father    Glaucoma Paternal Grandmother    Allergic rhinitis Neg Hx    Angioedema Neg Hx    Asthma Neg Hx    Eczema Neg Hx    Immunodeficiency Neg Hx    Urticaria Neg Hx    Colon cancer Neg Hx    Stomach cancer Neg Hx    Esophageal cancer Neg Hx    Colon polyps Neg Hx    Rectal cancer Neg Hx     Allergies  Allergen Reactions   Lisinopril  Cough    Current Outpatient Medications on File Prior to Visit  Medication Sig Dispense Refill   allopurinol  (ZYLOPRIM ) 100 MG tablet Take 1 tablet (100 mg total) by mouth daily. 90 tablet 0   Carboxymethylcellulose Sodium (LUBRICATING PLUS EYE DROPS OP) Apply 1 drop to eye 2 (two) times daily.     colchicine  0.6 MG tablet Take 1 tablet (0.6 mg total) by mouth daily as needed. 30 tablet 0   EPINEPHrine  0.3 mg/0.3 mL IJ SOAJ injection Inject 0.3 mLs (0.3 mg total) into the muscle once as needed for up to 1 dose for anaphylaxis. 0.3 mL 1   losartan  (COZAAR ) 100 MG tablet Take 1 tablet (100 mg total) by mouth daily. 90 tablet 1   montelukast  (SINGULAIR ) 10 MG tablet 1 tablet by mouth at bedtime as needed for allergy symptoms. 90 tablet 2   Multiple Vitamin (ANTIOXIDANT A/C/E PO) Take by mouth.     Multiple Vitamin (MULTIVITAMIN) tablet Take 1 tablet by mouth daily.     Multiple Vitamins-Minerals (PRESERVISION AREDS) CAPS Take by mouth.     omeprazole  (PRILOSEC) 40 MG capsule TAKE 1 CAPSULE BY MOUTH ONCE DAILY AS NEEDED 90 capsule 1   Propylene Glycol (SYSTANE BALANCE) 0.6 % SOLN Apply to eye.     rosuvastatin  (CRESTOR ) 5 MG tablet Take 1 tablet (5 mg total) by mouth every other day. 45 tablet 3   spironolactone  (ALDACTONE ) 25 MG tablet Take 1 tablet (25 mg total) by mouth daily. (Patient not taking: Reported on 08/17/2023) 90 tablet 0   Turmeric (QC TUMERIC COMPLEX PO) Take 1,000 mg by mouth in the morning and at  bedtime.     AMBULATORY NON FORMULARY MEDICATION Allergy Shots Every 3 weeks     No current facility-administered medications on file prior to visit.    BP 130/62   Pulse (!) 53  Temp (!) 97.5 F (36.4 C) (Oral)   Ht 5' 10 (1.778 m)   Wt 236 lb (107 kg)   SpO2 99%   BMI 33.86 kg/m       Objective:   Physical Exam Vitals and nursing note reviewed.  Constitutional:      Appearance: Normal appearance.  HENT:     Head:   Cardiovascular:     Rate and Rhythm: Normal rate and regular rhythm.     Pulses: Normal pulses.     Heart sounds: Normal heart sounds.  Pulmonary:     Effort: Pulmonary effort is normal.     Breath sounds: Normal breath sounds.  Neurological:     Mental Status: He is alert.  Psychiatric:        Mood and Affect: Mood normal.       Assessment & Plan:  1. Essential hypertension (Primary) - BP at goal in the office. Will have him bring his BP cuff into the office during his next visit. A - No change in BP medication at this point in time - Basic Metabolic Panel; Future - Basic Metabolic Panel  2. Solar keratosis Procedure note: Benefits and risks verbally discussed with patient Three freeze thaw cycle of cryotherapy performed  with liquid nitrogen to right scalp No complications.  Patient tolerated the procedure well other than mild pain.   Nhung Danko, NP

## 2023-08-24 ENCOUNTER — Ambulatory Visit (INDEPENDENT_AMBULATORY_CARE_PROVIDER_SITE_OTHER)

## 2023-08-24 DIAGNOSIS — J309 Allergic rhinitis, unspecified: Secondary | ICD-10-CM

## 2023-08-24 DIAGNOSIS — C61 Malignant neoplasm of prostate: Secondary | ICD-10-CM | POA: Diagnosis not present

## 2023-09-13 ENCOUNTER — Other Ambulatory Visit: Payer: Self-pay | Admitting: Family

## 2023-09-22 ENCOUNTER — Other Ambulatory Visit: Payer: Self-pay | Admitting: Adult Health

## 2023-09-22 DIAGNOSIS — M109 Gout, unspecified: Secondary | ICD-10-CM

## 2023-09-22 NOTE — Telephone Encounter (Signed)
 Copied from CRM #[id number]. Topic: Clinical - Medication Refill >> Sep 22, 2023 10:13 AM Franky GRADE wrote: Medication: colchicine  0.6 MG tablet [[id number]]  Has the patient contacted their pharmacy? Yes, they asked patient to contact his provider.  (Agent: If no, request that the patient contact the pharmacy for the refill. If patient does not wish to contact the pharmacy document the reason why and proceed with request.) (Agent: If yes, when and what did the pharmacy advise?)  This is the patient's preferred pharmacy:  [facility] [address] [place] - [address] [facility] [place] [address] Phone: [phone] Fax: [phone]    Is this the correct pharmacy for this prescription? Yes If no, delete pharmacy and type the correct one.   Has the prescription been filled recently? No  Is the patient out of the medication? No  Has the patient been seen for an appointment in the last year OR does the patient have an upcoming appointment? Yes  Can we respond through MyChart? Yes  Agent: Please be advised that Rx refills may take up to 3 business days. We ask that you follow-up with your pharmacy.

## 2023-09-25 ENCOUNTER — Ambulatory Visit (INDEPENDENT_AMBULATORY_CARE_PROVIDER_SITE_OTHER)

## 2023-09-25 DIAGNOSIS — J309 Allergic rhinitis, unspecified: Secondary | ICD-10-CM | POA: Diagnosis not present

## 2023-10-02 ENCOUNTER — Telehealth: Payer: Self-pay | Admitting: *Deleted

## 2023-10-02 NOTE — Telephone Encounter (Signed)
 Copied from CRM [id number]. Topic: Clinical - Medication Question >> Oct 02, 2023  9:26 AM Revonda D wrote: Reason for CRM: Pt stated that the pharmacy has sent over a refill request for the allopurinol  (ZYLOPRIM ) 100 MG tablet multiple times but hasn't received a response back. Pt stated that he is needing this medication refilled and would like a callback with an update.

## 2023-10-03 ENCOUNTER — Encounter (INDEPENDENT_AMBULATORY_CARE_PROVIDER_SITE_OTHER): Payer: PPO | Admitting: Ophthalmology

## 2023-10-03 ENCOUNTER — Other Ambulatory Visit: Payer: Self-pay

## 2023-10-03 DIAGNOSIS — H43813 Vitreous degeneration, bilateral: Secondary | ICD-10-CM | POA: Diagnosis not present

## 2023-10-03 DIAGNOSIS — I10 Essential (primary) hypertension: Secondary | ICD-10-CM | POA: Diagnosis not present

## 2023-10-03 DIAGNOSIS — H35033 Hypertensive retinopathy, bilateral: Secondary | ICD-10-CM

## 2023-10-03 DIAGNOSIS — H353132 Nonexudative age-related macular degeneration, bilateral, intermediate dry stage: Secondary | ICD-10-CM

## 2023-10-03 MED ORDER — ALLOPURINOL 100 MG PO TABS: 100.0000 mg | ORAL_TABLET | Freq: Every day | ORAL | 0 refills | Status: AC

## 2023-10-03 NOTE — Telephone Encounter (Signed)
 Prescription called in to pharmacy

## 2023-10-13 ENCOUNTER — Other Ambulatory Visit (INDEPENDENT_AMBULATORY_CARE_PROVIDER_SITE_OTHER): Payer: Self-pay

## 2023-10-13 ENCOUNTER — Encounter: Payer: Self-pay | Admitting: Surgical

## 2023-10-13 ENCOUNTER — Ambulatory Visit: Admitting: Surgical

## 2023-10-13 DIAGNOSIS — M25562 Pain in left knee: Secondary | ICD-10-CM

## 2023-10-13 DIAGNOSIS — M25462 Effusion, left knee: Secondary | ICD-10-CM

## 2023-10-13 MED ORDER — LIDOCAINE HCL 1 % IJ SOLN
5.0000 mL | INTRAMUSCULAR | Status: AC | PRN
  Administered 2023-10-13: 5 mL

## 2023-10-13 MED ORDER — TRIAMCINOLONE ACETONIDE 40 MG/ML IJ SUSP
40.0000 mg | INTRAMUSCULAR | Status: AC | PRN
  Administered 2023-10-13: 40 mg via INTRA_ARTICULAR

## 2023-10-13 MED ORDER — BUPIVACAINE HCL 0.25 % IJ SOLN
4.0000 mL | INTRAMUSCULAR | Status: AC | PRN
  Administered 2023-10-13: 4 mL via INTRA_ARTICULAR

## 2023-10-13 NOTE — Progress Notes (Signed)
 Office Visit Note   Patient: Marcus Gomez.           Date of Birth: 12/02/1945           MRN: [id number] Visit Date: 10/13/2023 Requested by: Merna Huxley, NP [address] [place],  [place] [address] PCP: Merna Huxley, NP  Subjective: Chief Complaint  Patient presents with   Left Knee - Pain    HPI: Waldron Gerry. is a 78 y.o. male who presents to the office reporting left knee pain.  Patient states he has had left knee pain for 2 weeks ever since he was sitting in a low chair with no arms and went to get up and felt a popping sensation in the lateral joint region.  He states since then he has had lateral sided knee pain that hurts pretty much all the time.  If he sleeps he has to put his leg in a semiflexed position in order to get some relief.  Denies any swelling at the time.  Taking Advil  with some relief.  Also using ice.  Symptoms are worse with walking and with knee flexion.  If he walks for a long period of time however the pain actually gets a little bit better.  He does have history of prior knee arthroscopy in that knee about 10 years ago with meniscal debridement.  No history of diabetes.  No groin pain.  No locking symptoms..                ROS: All systems reviewed are negative as they relate to the chief complaint within the history of present illness.  Patient denies fevers or chills.  Assessment & Plan: Visit Diagnoses:  1. Effusion, left knee   2. Acute pain of left knee     Plan: Impression is 78 year old male with history of left knee arthritis.  Has increased pain over the last 2 weeks since popping incident while actively standing up from a seated position.  Has effusion on exam which may or may not be acute.  We discussed options available to patient.  Plan to try cortisone injection.  Left knee aspirated of 15 cc of nonpurulent synovial fluid and cortisone injection administered.  Follow-up as needed if symptoms do not improve  but if he has very short-lived relief or no relief, next step would be MRI to evaluate for degenerative lateral meniscus tear.  Follow-Up Instructions: No follow-ups on file.   Orders:  Orders Placed This Encounter  Procedures   XR KNEE 3 VIEW LEFT   No orders of the defined types were placed in this encounter.     Procedures: Large Joint Inj: L knee on 10/13/2023 11:36 AM Indications: diagnostic evaluation, joint swelling and pain Details: 18 G 1.5 in needle, superolateral approach  Arthrogram: No  Medications: 5 mL lidocaine  1 %; 4 mL bupivacaine  0.25 %; 40 mg triamcinolone  acetonide 40 MG/ML Aspirate: 15 mL Outcome: tolerated well, no immediate complications Procedure, treatment alternatives, risks and benefits explained, specific risks discussed. Consent was given by the patient. Immediately prior to procedure a time out was called to verify the correct patient, procedure, equipment, support staff and site/side marked as required. Patient was prepped and draped in the usual sterile fashion.       Clinical Data: No additional findings.  Objective: Vital Signs: There were no vitals taken for this visit.  Physical Exam:  Constitutional: Patient appears well-developed HEENT:  Head: Normocephalic Eyes:EOM are normal Neck: Normal  range of motion Cardiovascular: Normal rate Pulmonary/chest: Effort normal Neurologic: Patient is alert Skin: Skin is warm Psychiatric: Patient has normal mood and affect  Ortho Exam: Ortho exam demonstrates left knee with small effusion.  No calf tenderness.  Negative Homans' sign.  Palpable PT pulse.  Able to perform straight leg raise without extensor lag.  Does have knee extension to about 5 degrees and knee flexion to 115 degrees.  No tenderness over the medial joint line.  Does have moderate tenderness over the lateral joint line.  Stable to anterior and posterior drawer sign.  No pain with hip range of motion.  Specialty Comments:  No  specialty comments available.  Imaging: No results found.   PMFS History: Patient Active Problem List   Diagnosis Date Noted   Acute non-recurrent frontal sinusitis 01/14/2022   GERD (gastroesophageal reflux disease) 07/24/2017   Perennial and seasonal allergic rhinitis 03/13/2017   Lung field abnormal finding on examination 08/31/2015   Essential hypertension 08/31/2015   Past Medical History:  Diagnosis Date   Allergies    Allergy    Aortic atherosclerosis    Arthritis    Cataract    Diverticulosis    Essential hypertension    GERD (gastroesophageal reflux disease)    Prostate cancer (HCC)    Remission   Spondylosis     Family History  Problem Relation Age of Onset   Stroke Mother    Dementia Mother    Hypotension Mother    Allergies Father    COPD Father        never smoker   Rheum arthritis Father    Glaucoma Father    Glaucoma Paternal Grandmother    Allergic rhinitis Neg Hx    Angioedema Neg Hx    Asthma Neg Hx    Eczema Neg Hx    Immunodeficiency Neg Hx    Urticaria Neg Hx    Colon cancer Neg Hx    Stomach cancer Neg Hx    Esophageal cancer Neg Hx    Colon polyps Neg Hx    Rectal cancer Neg Hx     Past Surgical History:  Procedure Laterality Date   KNEE SURGERY Bilateral 2015 & 2014   SHOULDER SURGERY Right 2016   TONSILLECTOMY     Social History   Occupational History   Occupation: retired  Tobacco Use   Smoking status: Never   Smokeless tobacco: Never  Vaping Use   Vaping status: Never Used  Substance and Sexual Activity   Alcohol use: Not Currently    Comment: none in 2 years 06/25/2021   Drug use: No   Sexual activity: Yes

## 2023-10-19 ENCOUNTER — Other Ambulatory Visit: Payer: Self-pay | Admitting: Family

## 2023-10-23 ENCOUNTER — Ambulatory Visit (INDEPENDENT_AMBULATORY_CARE_PROVIDER_SITE_OTHER)

## 2023-10-23 DIAGNOSIS — J309 Allergic rhinitis, unspecified: Secondary | ICD-10-CM | POA: Diagnosis not present

## 2023-10-25 DIAGNOSIS — J3081 Allergic rhinitis due to animal (cat) (dog) hair and dander: Secondary | ICD-10-CM | POA: Diagnosis not present

## 2023-10-25 DIAGNOSIS — J302 Other seasonal allergic rhinitis: Secondary | ICD-10-CM | POA: Diagnosis not present

## 2023-10-25 DIAGNOSIS — J3089 Other allergic rhinitis: Secondary | ICD-10-CM | POA: Diagnosis not present

## 2023-10-25 NOTE — Progress Notes (Signed)
 VIALS MAE 10-25-23

## 2023-10-26 DIAGNOSIS — J3081 Allergic rhinitis due to animal (cat) (dog) hair and dander: Secondary | ICD-10-CM | POA: Diagnosis not present

## 2023-10-26 DIAGNOSIS — J302 Other seasonal allergic rhinitis: Secondary | ICD-10-CM | POA: Diagnosis not present

## 2023-11-02 ENCOUNTER — Other Ambulatory Visit: Payer: Self-pay | Admitting: Adult Health

## 2023-11-02 DIAGNOSIS — M109 Gout, unspecified: Secondary | ICD-10-CM

## 2023-11-02 NOTE — Telephone Encounter (Signed)
 Copied from CRM #[id number]. Topic: Clinical - Medication Refill >> Nov 02, 2023 11:51 AM Pinkey ORN wrote: Medication: spironolactone  (ALDACTONE ) 25 MG tablet, colchicine  0.6 MG tablet  Has the patient contacted their pharmacy? Yes (Agent: If no, request that the patient contact the pharmacy for the refill. If patient does not wish to contact the pharmacy document the reason why and proceed with request.) (Agent: If yes, when and what did the pharmacy advise?)  This is the patient's preferred pharmacy:  [facility] [address] [place] - [address] [facility] [place] [address] Phone: [phone] Fax: [phone]   Is this the correct pharmacy for this prescription? Yes If no, delete pharmacy and type the correct one.   Has the prescription been filled recently? No  Is the patient out of the medication? Yes  Has the patient been seen for an appointment in the last year OR does the patient have an upcoming appointment? Yes  Can we respond through MyChart? No, prefer a phone call.   Agent: Please be advised that Rx refills may take up to 3 business days. We ask that you follow-up with your pharmacy.

## 2023-11-03 MED ORDER — SPIRONOLACTONE 25 MG PO TABS: 25.0000 mg | ORAL_TABLET | Freq: Every day | ORAL | 0 refills | Status: AC

## 2023-11-03 MED ORDER — COLCHICINE 0.6 MG PO TABS: 0.6000 mg | ORAL_TABLET | Freq: Every day | ORAL | 0 refills | Status: AC | PRN

## 2023-11-07 ENCOUNTER — Telehealth: Payer: Self-pay

## 2023-11-07 NOTE — Telephone Encounter (Signed)
 Copied from CRM #[id number]. Topic: Clinical - Prescription Issue >> Nov 06, 2023  1:51 PM Berneda FALCON wrote: Reason for CRM: Pt has an apt on 11/4 for his blood pressure medication but he is completely out of it and needs this until his appt if possible please.   spironolactone  (ALDACTONE ) 25 MG tablet  [id number] [facility], [place] - [address] [facility] [place] [address] Phone: [phone] Fax: [phone] Hours: Not open 24 hours

## 2023-11-07 NOTE — Telephone Encounter (Signed)
 Medication was filled yesterday. Left detailed message advising pt to return call if he had any issues picking this up.

## 2023-11-13 ENCOUNTER — Encounter: Payer: Self-pay | Admitting: Radiology

## 2023-11-14 ENCOUNTER — Encounter: Payer: Self-pay | Admitting: Adult Health

## 2023-11-14 ENCOUNTER — Ambulatory Visit: Admitting: Adult Health

## 2023-11-14 VITALS — BP 130/70 | HR 58 | Temp 98.0°F | Ht 70.0 in | Wt 225.0 lb

## 2023-11-14 DIAGNOSIS — I10 Essential (primary) hypertension: Secondary | ICD-10-CM | POA: Diagnosis not present

## 2023-11-14 NOTE — Progress Notes (Signed)
 Subjective:    Patient ID: Quintin Toribio Caprice Mickey., male    DOB: 01-20-1945, 78 y.o.   MRN: [id number]  HPI Discussed the use of AI scribe software for clinical note transcription with the patient, who gave verbal consent to proceed.  History of Present Illness   Shah Insley. Salomon is a 78 year old male who presents with issues related to medication refills and blood pressure management.  He is having difficulty obtaining a refill for spironolactone , which was sent to the pharmacy on October 24th but has not been received. He is considering changing pharmacies due to these issues. In the meantime, he has been taking Losartan  100 mg BID.        Review of Systems See HPI   Past Medical History:  Diagnosis Date   Allergies    Allergy    Aortic atherosclerosis    Arthritis    Cataract    Diverticulosis    Essential hypertension    GERD (gastroesophageal reflux disease)    Prostate cancer (HCC)    Remission   Spondylosis     Social History   Socioeconomic History   Marital status: Divorced    Spouse name: Not on file   Number of children: 0   Years of education: Not on file   Highest education level: Not on file  Occupational History   Occupation: retired  Tobacco Use   Smoking status: Never   Smokeless tobacco: Never  Vaping Use   Vaping status: Never Used  Substance and Sexual Activity   Alcohol use: Not Currently    Comment: none in 2 years 06/25/2021   Drug use: No   Sexual activity: Yes  Other Topics Concern   Not on file  Social History Narrative   Retired - Aeronautical Engineer    Divorced x 4    Two children - 2 girls both live designer, multimedia.       Social Drivers of Corporate Investment Banker Strain: Low Risk  (02/20/2023)   Overall Financial Resource Strain (CARDIA)    Difficulty of Paying Living Expenses: Not hard at all  Food Insecurity: No Food Insecurity (02/20/2023)   Hunger Vital Sign    Worried About Running Out of Food in the Last  Year: Never true    Ran Out of Food in the Last Year: Never true  Transportation Needs: No Transportation Needs (02/20/2023)   PRAPARE - Administrator, Civil Service (Medical): No    Lack of Transportation (Non-Medical): No  Physical Activity: Inactive (02/11/2022)   Exercise Vital Sign    Days of Exercise per Week: 0 days    Minutes of Exercise per Session: 0 min  Stress: No Stress Concern Present (02/20/2023)   [employer] of Occupational Health - Occupational Stress Questionnaire    Feeling of Stress : Not at all  Social Connections: Moderately Integrated (02/20/2023)   Social Connection and Isolation Panel    Frequency of Communication with Friends and Family: More than three times a week    Frequency of Social Gatherings with Friends and Family: More than three times a week    Attends Religious Services: More than 4 times per year    Active Member of [employer] or Organizations: Yes    Attends Banker Meetings: More than 4 times per year    Marital Status: Divorced  Intimate Partner Violence: Not At Risk (02/20/2023)   Humiliation, Afraid, Rape, and Kick questionnaire    Fear  of Current or Ex-Partner: No    Emotionally Abused: No    Physically Abused: No    Sexually Abused: No    Past Surgical History:  Procedure Laterality Date   KNEE SURGERY Bilateral 2015 & 2014   SHOULDER SURGERY Right 2016   TONSILLECTOMY      Family History  Problem Relation Age of Onset   Stroke Mother    Dementia Mother    Hypotension Mother    Allergies Father    COPD Father        never smoker   Rheum arthritis Father    Glaucoma Father    Glaucoma Paternal Grandmother    Allergic rhinitis Neg Hx    Angioedema Neg Hx    Asthma Neg Hx    Eczema Neg Hx    Immunodeficiency Neg Hx    Urticaria Neg Hx    Colon cancer Neg Hx    Stomach cancer Neg Hx    Esophageal cancer Neg Hx    Colon polyps Neg Hx    Rectal cancer Neg Hx     Allergies  Allergen Reactions    Lisinopril  Cough    Current Outpatient Medications on File Prior to Visit  Medication Sig Dispense Refill   allopurinol  (ZYLOPRIM ) 100 MG tablet Take 1 tablet (100 mg total) by mouth daily. 90 tablet 0   AMBULATORY NON FORMULARY MEDICATION Allergy Shots Every 3 weeks     Carboxymethylcellulose Sodium (LUBRICATING PLUS EYE DROPS OP) Apply 1 drop to eye 2 (two) times daily.     colchicine  0.6 MG tablet Take 1 tablet (0.6 mg total) by mouth daily as needed. 90 tablet 0   EPINEPHrine  0.3 mg/0.3 mL IJ SOAJ injection Inject 0.3 mLs (0.3 mg total) into the muscle once as needed for up to 1 dose for anaphylaxis. 0.3 mL 1   losartan  (COZAAR ) 100 MG tablet Take 1 tablet (100 mg total) by mouth daily. 90 tablet 1   montelukast  (SINGULAIR ) 10 MG tablet 1 tablet by mouth at bedtime as needed for allergy symptoms. 90 tablet 2   Multiple Vitamin (ANTIOXIDANT A/C/E PO) Take by mouth.     Multiple Vitamin (MULTIVITAMIN) tablet Take 1 tablet by mouth daily.     Multiple Vitamins-Minerals (PRESERVISION AREDS) CAPS Take by mouth.     omeprazole  (PRILOSEC) 40 MG capsule TAKE 1 CAPSULE BY MOUTH ONCE DAILY AS NEEDED 90 capsule 0   Propylene Glycol (SYSTANE BALANCE) 0.6 % SOLN Apply to eye.     rosuvastatin  (CRESTOR ) 5 MG tablet Take 1 tablet (5 mg total) by mouth every other day. 45 tablet 3   spironolactone  (ALDACTONE ) 25 MG tablet Take 1 tablet (25 mg total) by mouth daily. 90 tablet 0   Turmeric (QC TUMERIC COMPLEX PO) Take 1,000 mg by mouth in the morning and at bedtime.     No current facility-administered medications on file prior to visit.    BP 130/70   Pulse (!) 58   Temp 98 F (36.7 C) (Oral)   Ht 5' 10 (1.778 m)   Wt 225 lb (102.1 kg)   SpO2 98%   BMI 32.28 kg/m       Objective:   Physical Exam Vitals and nursing note reviewed.  Constitutional:      Appearance: Normal appearance.  Cardiovascular:     Rate and Rhythm: Normal rate and regular rhythm.     Pulses: Normal pulses.      Heart sounds: Normal heart sounds.  Pulmonary:  Effort: Pulmonary effort is normal.     Breath sounds: Normal breath sounds.  Skin:    General: Skin is warm and dry.     Capillary Refill: Capillary refill takes less than 2 seconds.  Neurological:     General: No focal deficit present.     Mental Status: He is alert and oriented to person, place, and time.  Psychiatric:        Mood and Affect: Mood normal.        Behavior: Behavior normal.        Thought Content: Thought content normal.        Judgment: Judgment normal.       Assessment & Plan:  1. Essential hypertension (Primary) - His BP medications were sent in. We will call his pharmacy today  - Stop taking Losartan  100 mg BID - take daily   Keona Bilyeu, NP

## 2023-11-17 ENCOUNTER — Encounter: Payer: Self-pay | Admitting: Surgical

## 2023-11-17 ENCOUNTER — Ambulatory Visit: Payer: Self-pay | Admitting: Surgical

## 2023-11-17 DIAGNOSIS — M1711 Unilateral primary osteoarthritis, right knee: Secondary | ICD-10-CM

## 2023-11-17 DIAGNOSIS — M25462 Effusion, left knee: Secondary | ICD-10-CM | POA: Diagnosis not present

## 2023-11-17 DIAGNOSIS — M1712 Unilateral primary osteoarthritis, left knee: Secondary | ICD-10-CM

## 2023-11-17 NOTE — Addendum Note (Signed)
 Addended by: TRINDA DEANE HERO on: 11/17/2023 01:21 PM   Modules accepted: Orders

## 2023-11-17 NOTE — Progress Notes (Signed)
 Follow-up Office Visit Note   Patient: Marcus Gomez.           Date of Birth: 21-Mar-1945           MRN: [id number] Visit Date: 11/17/2023 Requested by: Marcus Huxley, NP [address] [place],  [place] [address] PCP: Marcus Huxley, NP  Subjective: Chief Complaint  Patient presents with   Left Knee - Pain    HPI: Marcus Imran. is a 78 y.o. male who returns to the office for follow-up visit.    Plan at last visit was: Impression is 78 year old male with history of left knee arthritis. Has increased pain over the last 2 weeks since popping incident while actively standing up from a seated position. Has effusion on exam which may or may not be acute. We discussed options available to patient. Plan to try cortisone injection. Left knee aspirated of 15 cc of nonpurulent synovial fluid and cortisone injection administered. Follow-up as needed if symptoms do not improve but if he has very short-lived relief or no relief, next step would be MRI to evaluate for degenerative lateral meniscus tear.   Since then, patient notes he had 2 to 3 weeks of decent relief from cortisone injection.  After that, pain returned and is pretty much the same level as it was at his last visit on 10/13/2023.  Continues to endorse lateral and posterolateral knee pain.  Significantly worse with ambulation and he limps as a result of the knee pain.  He had prior knee arthroscopy about 10 years ago that really did not help significantly with his knee pain but he did have prior gel injection which gave him great relief.              ROS: All systems reviewed are negative as they relate to the chief complaint within the history of present illness.  Patient denies fevers or chills.  Assessment & Plan: Visit Diagnoses:  1. Effusion, left knee   2. Arthritis of left knee     Plan: Marcus Kilner. is a 78 y.o. male who returns to the office for follow-up visit.  Plan from last  visit was noted above in HPI.  They now return with left knee pain that briefly improved from cortisone injection but has now returned.  We discussed options available to patient.  Plan for MRI of the left knee to evaluate degenerative lateral meniscus tear versus occult lateral compartment arthritis.  Also plan for bilateral knee gel injection since this has provided great relief for patient about a decade ago.  This patient is diagnosed with osteoarthritis of the knee(s).    Radiographs show evidence of joint space narrowing, osteophytes, subchondral sclerosis and/or subchondral cysts.  This patient has knee pain which interferes with functional and activities of daily living.    This patient has experienced inadequate response, adverse effects and/or intolerance with conservative treatments such as acetaminophen , NSAIDS, topical creams, physical therapy or regular exercise, knee bracing and/or weight loss.   This patient has experienced inadequate response or has a contraindication to intra articular steroid injections for at least 3 months.   This patient is not scheduled to have a total knee replacement within 6 months of starting treatment with viscosupplementation.   Follow-Up Instructions: No follow-ups on file.   Orders:  No orders of the defined types were placed in this encounter.  No orders of the defined types were placed in this encounter.     Procedures:  No procedures performed   Clinical Data: No additional findings.  Objective: Vital Signs: There were no vitals taken for this visit.  Physical Exam:  Constitutional: Patient appears well-developed HEENT:  Head: Normocephalic Eyes:EOM are normal Neck: Normal range of motion Cardiovascular: Normal rate Pulmonary/chest: Effort normal Neurologic: Patient is alert Skin: Skin is warm Psychiatric: Patient has normal mood and affect  Ortho Exam: Ortho exam demonstrates left knee with positive effusion.  Has  extension to about 10 degrees which is painful.  Has knee flexion to about 115 degrees which is not as painful.  Mild to moderate tenderness over the medial joint line.  Moderate to severe tenderness over the lateral joint line, especially posterior lateral joint line.  Crepitus noted with passive motion of the knee.  No calf tenderness.  Negative Homans' sign.  Palpable PT pulse.  Stable to anterior and posterior drawer sign.  No pain with hip range of motion.  Able to perform straight leg raise.  Specialty Comments:  No specialty comments available.  Imaging: No results found.   PMFS History: Patient Active Problem List   Diagnosis Date Noted   Acute non-recurrent frontal sinusitis 01/14/2022   GERD (gastroesophageal reflux disease) 07/24/2017   Perennial and seasonal allergic rhinitis 03/13/2017   Lung field abnormal finding on examination 08/31/2015   Essential hypertension 08/31/2015   Past Medical History:  Diagnosis Date   Allergies    Allergy    Aortic atherosclerosis    Arthritis    Cataract    Diverticulosis    Essential hypertension    GERD (gastroesophageal reflux disease)    Prostate cancer (HCC)    Remission   Spondylosis     Family History  Problem Relation Age of Onset   Stroke Mother    Dementia Mother    Hypotension Mother    Allergies Father    COPD Father        never smoker   Rheum arthritis Father    Glaucoma Father    Glaucoma Paternal Grandmother    Allergic rhinitis Neg Hx    Angioedema Neg Hx    Asthma Neg Hx    Eczema Neg Hx    Immunodeficiency Neg Hx    Urticaria Neg Hx    Colon cancer Neg Hx    Stomach cancer Neg Hx    Esophageal cancer Neg Hx    Colon polyps Neg Hx    Rectal cancer Neg Hx     Past Surgical History:  Procedure Laterality Date   KNEE SURGERY Bilateral 2015 & 2014   SHOULDER SURGERY Right 2016   TONSILLECTOMY     Social History   Occupational History   Occupation: retired  Tobacco Use   Smoking status: Never    Smokeless tobacco: Never  Vaping Use   Vaping status: Never Used  Substance and Sexual Activity   Alcohol use: Not Currently    Comment: none in 2 years 06/25/2021   Drug use: No   Sexual activity: Yes

## 2023-11-20 ENCOUNTER — Ambulatory Visit: Admitting: Surgical

## 2023-11-22 ENCOUNTER — Encounter: Payer: Self-pay | Admitting: Allergy

## 2023-11-22 ENCOUNTER — Ambulatory Visit: Admitting: Allergy

## 2023-11-22 ENCOUNTER — Other Ambulatory Visit: Payer: Self-pay

## 2023-11-22 ENCOUNTER — Ambulatory Visit (INDEPENDENT_AMBULATORY_CARE_PROVIDER_SITE_OTHER)

## 2023-11-22 VITALS — BP 126/64 | HR 54 | Temp 98.2°F | Ht 69.5 in | Wt 224.8 lb

## 2023-11-22 DIAGNOSIS — J302 Other seasonal allergic rhinitis: Secondary | ICD-10-CM | POA: Diagnosis not present

## 2023-11-22 DIAGNOSIS — K219 Gastro-esophageal reflux disease without esophagitis: Secondary | ICD-10-CM | POA: Diagnosis not present

## 2023-11-22 DIAGNOSIS — J3089 Other allergic rhinitis: Secondary | ICD-10-CM | POA: Diagnosis not present

## 2023-11-22 DIAGNOSIS — J309 Allergic rhinitis, unspecified: Secondary | ICD-10-CM | POA: Diagnosis not present

## 2023-11-22 NOTE — Progress Notes (Signed)
 Follow Up Note  RE: Marcus Gomez. MRN: [id number] DOB: 08-22-1945 Date of Office Visit: 11/22/2023  Referring provider: Merna Huxley, NP Primary care provider: Merna Huxley, NP  Chief Complaint: Allergic Rhinitis  (Doing pretty good. Bothers every now and then. Managing with medication)  History of Present Illness: I had the pleasure of seeing Marcus Gomez for a follow up visit at the [facility] on 11/22/2023. He is a 78 y.o. male, who is being followed for allergic rhinitis on AIT and GERD. His previous allergy office visit was on 10/05/2022 with Wanda Craze FNP. Today is a regular follow up visit.  Discussed the use of AI scribe software for clinical note transcription with the patient, who gave verbal consent to proceed.    He has been receiving allergy shots for almost five years, with the most recent shot administered this morning. He reports that he thinks the shots have helped his allergies, and he experiences no itching following the shots. He takes Singulair  every other night for his allergies and has not attempted to discontinue it. He uses a nasal spray occasionally, particularly when exposed to dust, but tries to avoid dusty environments.  He is scheduled for an MRI on his left knee this Sunday due to recurring pain. No new medications, surgeries, or medical diagnoses have been reported since his last visit.     Assessment and Plan: Marcus Gomez is a 78 y.o. male with: Perennial and seasonal allergic rhinitis Past history - 2019 skin testing positive to grass, trees, mold, cat, dog, cockroach and dust mites. Started AIT on 11/12/2018 (G-T-DM-D & M-C-CR). Used to be on AIT prior to this but then symptoms returned.  Interim history - doing well on AIT.   Continue environmental control measures. Continue allergy injections - given today.  We will finish the vials that you have and then we will stop.  May use Singulair  10mg  daily as  needed.  Only use as needed.  May use Ryaltris  (olopatadine  + mometasone nasal spray combination) 1-2 sprays per nostril twice a day as needed.  Nasal saline spray (i.e., Simply Saline) or nasal saline lavage (i.e., NeilMed) is recommended as needed and prior to medicated nasal sprays.   GERD (gastroesophageal reflux disease) Past history - Had EGD in June 2023 which showed Barrett's esophagus. Doing better and lump sensation in throat is gone.  Interim history - stable.  Continue appropriate reflux lifestyle modifications. Continue omeprazole  40mg  daily in the mornings. No food/drink for 30 minutes afterwards.   Return in about 1 year (around 11/21/2024).  No orders of the defined types were placed in this encounter.  Lab Orders  No laboratory test(s) ordered today    Diagnostics: None.   Medication List:  Current Outpatient Medications  Medication Sig Dispense Refill   allopurinol  (ZYLOPRIM ) 100 MG tablet Take 1 tablet (100 mg total) by mouth daily. 90 tablet 0   AMBULATORY NON FORMULARY MEDICATION Allergy Shots Every 3 weeks     Carboxymethylcellulose Sodium (LUBRICATING PLUS EYE DROPS OP) Apply 1 drop to eye 2 (two) times daily.     colchicine  0.6 MG tablet Take 1 tablet (0.6 mg total) by mouth daily as needed. 90 tablet 0   EPINEPHrine  0.3 mg/0.3 mL IJ SOAJ injection Inject 0.3 mLs (0.3 mg total) into the muscle once as needed for up to 1 dose for anaphylaxis. 0.3 mL 1   losartan  (COZAAR ) 100 MG tablet Take 1 tablet (100 mg total) by mouth daily.  90 tablet 1   montelukast  (SINGULAIR ) 10 MG tablet 1 tablet by mouth at bedtime as needed for allergy symptoms. 90 tablet 2   Multiple Vitamin (ANTIOXIDANT A/C/E PO) Take by mouth.     Multiple Vitamin (MULTIVITAMIN) tablet Take 1 tablet by mouth daily.     Multiple Vitamins-Minerals (PRESERVISION AREDS) CAPS Take by mouth.     omeprazole  (PRILOSEC) 40 MG capsule TAKE 1 CAPSULE BY MOUTH ONCE DAILY AS NEEDED 90 capsule 0   Propylene  Glycol (SYSTANE BALANCE) 0.6 % SOLN Apply to eye.     rosuvastatin  (CRESTOR ) 5 MG tablet Take 1 tablet (5 mg total) by mouth every other day. 45 tablet 3   spironolactone  (ALDACTONE ) 25 MG tablet Take 1 tablet (25 mg total) by mouth daily. 90 tablet 0   Turmeric (QC TUMERIC COMPLEX PO) Take 1,000 mg by mouth in the morning and at bedtime.     No current facility-administered medications for this visit.   Allergies: Allergies  Allergen Reactions   Lisinopril  Cough   I reviewed his past medical history, social history, family history, and environmental history and no significant changes have been reported from his previous visit.  Review of Systems  Constitutional:  Negative for appetite change, chills, fever and unexpected weight change.  HENT:  Negative for congestion and rhinorrhea.   Eyes:  Negative for itching.  Respiratory:  Negative for cough, chest tightness, shortness of breath and wheezing.   Cardiovascular:  Negative for chest pain.  Gastrointestinal:  Negative for abdominal pain.  Genitourinary:  Negative for difficulty urinating.  Skin:  Negative for rash.  Allergic/Immunologic: Positive for environmental allergies.  Neurological:  Negative for headaches.    Objective: BP 126/64 (BP Location: Right Arm, Patient Position: Sitting, Cuff Size: Normal)   Pulse (!) 54   Temp 98.2 F (36.8 C) (Temporal)   Ht 5' 9.5 (1.765 m)   Wt 224 lb 12.8 oz (102 kg)   SpO2 99%   BMI 32.72 kg/m  Body mass index is 32.72 kg/m. Physical Exam Vitals and nursing note reviewed.  Constitutional:      Appearance: Normal appearance. He is well-developed.  HENT:     Head: Normocephalic and atraumatic.     Right Ear: Tympanic membrane and external ear normal.     Left Ear: Tympanic membrane and external ear normal.     Nose: Nose normal.     Mouth/Throat:     Mouth: Mucous membranes are moist.     Pharynx: Oropharynx is clear.  Eyes:     Conjunctiva/sclera: Conjunctivae normal.   Cardiovascular:     Rate and Rhythm: Normal rate and regular rhythm.     Heart sounds: Normal heart sounds. No murmur heard.    No friction rub. No gallop.  Pulmonary:     Effort: Pulmonary effort is normal.     Breath sounds: Normal breath sounds. No wheezing, rhonchi or rales.  Musculoskeletal:     Cervical back: Neck supple.  Skin:    General: Skin is warm.     Findings: No rash.  Neurological:     Mental Status: He is alert and oriented to person, place, and time.  Psychiatric:        Behavior: Behavior normal.    Previous notes and tests were reviewed. The plan was reviewed with the patient/family, and all questions/concerned were addressed.  It was my pleasure to see Marcus Gomez today and participate in his care. Please feel free to contact me with any questions  or concerns.  Sincerely,  Orlan Cramp, DO Allergy & Immunology  [facility]  [facility] office: [phone] [facility] office: [phone]

## 2023-11-22 NOTE — Patient Instructions (Addendum)
 Perennial and seasonal allergic rhinitis 2019 skin testing positive to grass, trees, mold, cat, dog, cockroach and dust mites. Continue environmental control measures. Continue allergy injections - given today.  We will finish the vials that you have in the fridge and then we will stop.   May use Singulair  10mg  daily as needed.  Only use as needed.  May use Ryaltris  (olopatadine  + mometasone nasal spray combination) 1-2 sprays per nostril twice a day as needed.  Nasal saline spray (i.e., Simply Saline) or nasal saline lavage (i.e., NeilMed) is recommended as needed and prior to medicated nasal sprays.   GERD (gastroesophageal reflux disease) Continue appropriate reflux lifestyle modifications. Continue omeprazole  40mg  daily in the mornings. No food/drink for 30 minutes afterwards.   Follow up in 12 months or sooner if needed

## 2023-11-26 ENCOUNTER — Ambulatory Visit
Admission: RE | Admit: 2023-11-26 | Discharge: 2023-11-26 | Disposition: A | Discharge status: Home / Self Care | Source: Ambulatory Visit | Attending: Surgical | Admitting: Surgical

## 2023-11-26 DIAGNOSIS — M25462 Effusion, left knee: Secondary | ICD-10-CM

## 2023-11-26 DIAGNOSIS — S8992XA Unspecified injury of left lower leg, initial encounter: Secondary | ICD-10-CM | POA: Diagnosis not present

## 2023-11-26 DIAGNOSIS — M1712 Unilateral primary osteoarthritis, left knee: Secondary | ICD-10-CM

## 2023-11-26 DIAGNOSIS — M25562 Pain in left knee: Secondary | ICD-10-CM | POA: Diagnosis not present

## 2023-12-11 ENCOUNTER — Other Ambulatory Visit: Payer: Self-pay | Admitting: Family

## 2023-12-15 ENCOUNTER — Ambulatory Visit: Admitting: Surgical

## 2023-12-15 ENCOUNTER — Encounter: Payer: Self-pay | Admitting: Surgical

## 2023-12-15 DIAGNOSIS — M1712 Unilateral primary osteoarthritis, left knee: Secondary | ICD-10-CM

## 2023-12-15 DIAGNOSIS — M1711 Unilateral primary osteoarthritis, right knee: Secondary | ICD-10-CM

## 2023-12-15 DIAGNOSIS — M170 Bilateral primary osteoarthritis of knee: Secondary | ICD-10-CM

## 2023-12-15 DIAGNOSIS — M25462 Effusion, left knee: Secondary | ICD-10-CM

## 2023-12-15 MED ORDER — LIDOCAINE HCL 1 % IJ SOLN
5.0000 mL | INTRAMUSCULAR | Status: AC | PRN
  Administered 2023-12-15: 5 mL

## 2023-12-15 MED ORDER — HYALURONAN 88 MG/4ML IX SOSY
88.0000 mg | PREFILLED_SYRINGE | INTRA_ARTICULAR | Status: AC | PRN
  Administered 2023-12-15: 88 mg via INTRA_ARTICULAR

## 2023-12-15 NOTE — Progress Notes (Signed)
   Procedure Note  Patient: Marcus Gomez.             Date of Birth: 18-Oct-1945           MRN: [id number]             Visit Date: 12/15/2023  Procedures: Visit Diagnoses:  1. Effusion, left knee   2. Arthritis of left knee   3. Arthritis of right knee     Large Joint Inj: bilateral knee on 12/15/2023 10:07 AM Indications: diagnostic evaluation, joint swelling and pain Details: 18 G 1.5 in needle, superolateral approach  Arthrogram: No  Medications (Right): 5 mL lidocaine  1 %; 88 mg Hyaluronan 88 MG/4ML Medications (Left): 5 mL lidocaine  1 %; 88 mg Hyaluronan 88 MG/4ML Outcome: tolerated well, no immediate complications Procedure, treatment alternatives, risks and benefits explained, specific risks discussed. Consent was given by the patient. Immediately prior to procedure a time out was called to verify the correct patient, procedure, equipment, support staff and site/side marked as required. Patient was prepped and draped in the usual sterile fashion.

## 2023-12-19 ENCOUNTER — Other Ambulatory Visit: Payer: Self-pay | Admitting: Adult Health

## 2023-12-19 DIAGNOSIS — E782 Mixed hyperlipidemia: Secondary | ICD-10-CM

## 2023-12-19 NOTE — Telephone Encounter (Unsigned)
 Copied from CRM #[id number]. Topic: Clinical - Medication Refill >> Dec 19, 2023  2:31 PM Thersia C wrote: Medication: rosuvastatin  (CRESTOR ) 5 MG tablet  Has the patient contacted their pharmacy? Yes (Agent: If no, request that the patient contact the pharmacy for the refill. If patient does not wish to contact the pharmacy document the reason why and proceed with request.) (Agent: If yes, when and what did the pharmacy advise?)  This is the patient's preferred pharmacy:  [facility] [address] [place] - [address] [facility] [place] [address] Phone: [phone] Fax: [phone]    Is this the correct pharmacy for this prescription? Yes If no, delete pharmacy and type the correct one.   Has the prescription been filled recently? No  Is the patient out of the medication? Yes  Has the patient been seen for an appointment in the last year OR does the patient have an upcoming appointment? Yes  Can we respond through MyChart? Yes  Agent: Please be advised that Rx refills may take up to 3 business days. We ask that you follow-up with your pharmacy.

## 2023-12-21 LAB — OPHTHALMOLOGY REPORT-SCANNED

## 2023-12-22 MED ORDER — ROSUVASTATIN CALCIUM 5 MG PO TABS: 5.0000 mg | ORAL_TABLET | ORAL | 3 refills | Status: AC

## 2023-12-25 ENCOUNTER — Other Ambulatory Visit: Payer: Self-pay | Admitting: Adult Health

## 2023-12-25 DIAGNOSIS — I10 Essential (primary) hypertension: Secondary | ICD-10-CM

## 2023-12-25 NOTE — Telephone Encounter (Unsigned)
 Copied from CRM #[id number]. Topic: Clinical - Medication Refill >> Dec 25, 2023  3:47 PM Robinson H wrote: Medication: losartan  (COZAAR ) 100 MG tablet  Has the patient contacted their pharmacy? Yes, new pharmacy needs new prescription (Agent: If no, request that the patient contact the pharmacy for the refill. If patient does not wish to contact the pharmacy document the reason why and proceed with request.) (Agent: If yes, when and what did the pharmacy advise?)  This is the patient's preferred pharmacy:  [id number] [address], [place] - [address] [place] [address] Phone: [phone] Fax: [phone]  Is this the correct pharmacy for this prescription? Yes If no, delete pharmacy and type the correct one.   Has the prescription been filled recently? No  Is the patient out of the medication? No  Has the patient been seen for an appointment in the last year OR does the patient have an upcoming appointment? Yes  Can we respond through MyChart? No  Agent: Please be advised that Rx refills may take up to 3 business days. We ask that you follow-up with your pharmacy.

## 2023-12-26 ENCOUNTER — Ambulatory Visit

## 2023-12-26 DIAGNOSIS — J309 Allergic rhinitis, unspecified: Secondary | ICD-10-CM

## 2023-12-26 DIAGNOSIS — J302 Other seasonal allergic rhinitis: Secondary | ICD-10-CM | POA: Diagnosis not present

## 2023-12-29 ENCOUNTER — Other Ambulatory Visit: Payer: Self-pay

## 2023-12-29 ENCOUNTER — Telehealth: Payer: Self-pay

## 2023-12-29 DIAGNOSIS — I10 Essential (primary) hypertension: Secondary | ICD-10-CM

## 2023-12-29 MED ORDER — LOSARTAN POTASSIUM 100 MG PO TABS: 100.0000 mg | ORAL_TABLET | Freq: Every day | ORAL | 1 refills | Status: AC

## 2023-12-29 NOTE — Telephone Encounter (Signed)
 Refilled called in. Called pt to inform of update. Pt verbalized understanding.

## 2023-12-29 NOTE — Telephone Encounter (Unsigned)
 Copied from CRM #[id number]. Topic: Clinical - Medication Refill >> Dec 25, 2023  3:47 PM Robinson H wrote: Medication: losartan  (COZAAR ) 100 MG tablet  Has the patient contacted their pharmacy? Yes, new pharmacy needs new prescription (Agent: If no, request that the patient contact the pharmacy for the refill. If patient does not wish to contact the pharmacy document the reason why and proceed with request.) (Agent: If yes, when and what did the pharmacy advise?)  This is the patient's preferred pharmacy:  [id number] [address], [place] - [address] [place] [address] Phone: [phone] Fax: [phone]  Is this the correct pharmacy for this prescription? Yes If no, delete pharmacy and type the correct one.   Has the prescription been filled recently? No  Is the patient out of the medication? No  Has the patient been seen for an appointment in the last year OR does the patient have an upcoming appointment? Yes  Can we respond through MyChart? No  Agent: Please be advised that Rx refills may take up to 3 business days. We ask that you follow-up with your pharmacy. >> Dec 29, 2023  1:06 PM Mercedes MATSU wrote: Patient called in stating that he called in Monday for a refill on his losartan . He said that he is completely out of his medication and has not heard anything back in regards to the refill, he did check with the pharmacy as well. Patient is asking for the request to be fulfilled today and a follow up call where he can be reached at [phone].

## 2024-02-26 ENCOUNTER — Ambulatory Visit: Payer: PPO

## 2024-10-07 ENCOUNTER — Encounter (INDEPENDENT_AMBULATORY_CARE_PROVIDER_SITE_OTHER): Admitting: Ophthalmology
